# Patient Record
Sex: Male | Born: 1945
Health system: Southern US, Community
[De-identification: ages and names within clinical notes are randomized; demographics above are authoritative.]

## PROBLEM LIST (undated history)

## (undated) DIAGNOSIS — E785 Hyperlipidemia, unspecified: Secondary | ICD-10-CM

## (undated) DIAGNOSIS — C439 Malignant melanoma of skin, unspecified: Secondary | ICD-10-CM

## (undated) DIAGNOSIS — I82409 Acute embolism and thrombosis of unspecified deep veins of unspecified lower extremity: Secondary | ICD-10-CM

## (undated) DIAGNOSIS — I10 Essential (primary) hypertension: Secondary | ICD-10-CM

## (undated) DIAGNOSIS — N400 Enlarged prostate without lower urinary tract symptoms: Secondary | ICD-10-CM

## (undated) DIAGNOSIS — I2510 Atherosclerotic heart disease of native coronary artery without angina pectoris: Secondary | ICD-10-CM

## (undated) DIAGNOSIS — R339 Retention of urine, unspecified: Secondary | ICD-10-CM

## (undated) DIAGNOSIS — I639 Cerebral infarction, unspecified: Secondary | ICD-10-CM

## (undated) HISTORY — PX: TONSILLECTOMY: SUR1361

## (undated) HISTORY — PX: CATARACT EXTRACTION W/ INTRAOCULAR LENS  IMPLANT, BILATERAL: SHX1307

## (undated) HISTORY — DX: Cerebral infarction, unspecified: I63.9

## (undated) HISTORY — PX: COLONOSCOPY: SHX174

## (undated) HISTORY — DX: Hyperlipidemia, unspecified: E78.5

---

## 2001-07-29 DIAGNOSIS — I82409 Acute embolism and thrombosis of unspecified deep veins of unspecified lower extremity: Secondary | ICD-10-CM

## 2001-07-29 HISTORY — DX: Acute embolism and thrombosis of unspecified deep veins of unspecified lower extremity: I82.409

## 2002-01-25 ENCOUNTER — Ambulatory Visit (HOSPITAL_COMMUNITY): Admission: RE | Admit: 2002-01-25 | Discharge: 2002-01-25 | Payer: Self-pay | Admitting: Cardiology

## 2002-01-28 ENCOUNTER — Encounter: Payer: Self-pay | Admitting: Surgery

## 2002-02-01 ENCOUNTER — Encounter: Payer: Self-pay | Admitting: Surgery

## 2002-02-01 ENCOUNTER — Inpatient Hospital Stay (HOSPITAL_COMMUNITY): Admission: RE | Admit: 2002-02-01 | Discharge: 2002-02-05 | Payer: Self-pay | Admitting: Surgery

## 2002-02-02 ENCOUNTER — Encounter: Payer: Self-pay | Admitting: Surgery

## 2002-02-03 ENCOUNTER — Encounter: Payer: Self-pay | Admitting: Surgery

## 2002-02-05 HISTORY — PX: CORONARY ARTERY BYPASS GRAFT: SHX141

## 2002-02-16 ENCOUNTER — Encounter (HOSPITAL_COMMUNITY): Admission: RE | Admit: 2002-02-16 | Discharge: 2002-02-16 | Payer: Self-pay | Admitting: Cardiology

## 2002-02-16 ENCOUNTER — Encounter (HOSPITAL_COMMUNITY): Admission: RE | Admit: 2002-02-16 | Discharge: 2002-05-17 | Payer: Self-pay | Admitting: Cardiology

## 2002-02-17 ENCOUNTER — Inpatient Hospital Stay (HOSPITAL_COMMUNITY)
Admission: AD | Admit: 2002-02-17 | Discharge: 2002-02-24 | Payer: Self-pay | Admitting: Thoracic Surgery (Cardiothoracic Vascular Surgery)

## 2002-02-24 ENCOUNTER — Encounter: Payer: Self-pay | Admitting: Surgery

## 2002-03-01 ENCOUNTER — Encounter: Admission: RE | Admit: 2002-03-01 | Discharge: 2002-05-30 | Payer: Self-pay | Admitting: Surgery

## 2002-05-20 ENCOUNTER — Encounter (HOSPITAL_COMMUNITY): Admission: RE | Admit: 2002-05-20 | Discharge: 2002-08-18 | Payer: Self-pay | Admitting: Cardiology

## 2009-04-14 HISTORY — PX: US ECHOCARDIOGRAPHY: HXRAD669

## 2010-07-29 HISTORY — PX: MELANOMA EXCISION: SHX5266

## 2010-07-29 HISTORY — PX: OTHER SURGICAL HISTORY: SHX169

## 2011-07-14 ENCOUNTER — Emergency Department (HOSPITAL_COMMUNITY)
Admission: EM | Admit: 2011-07-14 | Discharge: 2011-07-14 | Disposition: A | Discharge status: Home / Self Care | Payer: Medicare Other | Attending: Emergency Medicine | Admitting: Emergency Medicine

## 2011-07-14 ENCOUNTER — Emergency Department (HOSPITAL_COMMUNITY): Payer: Medicare Other

## 2011-07-14 ENCOUNTER — Encounter: Payer: Self-pay | Admitting: Emergency Medicine

## 2011-07-14 DIAGNOSIS — Z79899 Other long term (current) drug therapy: Secondary | ICD-10-CM | POA: Insufficient documentation

## 2011-07-14 DIAGNOSIS — N5089 Other specified disorders of the male genital organs: Secondary | ICD-10-CM | POA: Insufficient documentation

## 2011-07-14 DIAGNOSIS — E785 Hyperlipidemia, unspecified: Secondary | ICD-10-CM | POA: Insufficient documentation

## 2011-07-14 DIAGNOSIS — Z7982 Long term (current) use of aspirin: Secondary | ICD-10-CM | POA: Insufficient documentation

## 2011-07-14 DIAGNOSIS — N453 Epididymo-orchitis: Secondary | ICD-10-CM | POA: Insufficient documentation

## 2011-07-14 DIAGNOSIS — R1032 Left lower quadrant pain: Secondary | ICD-10-CM | POA: Insufficient documentation

## 2011-07-14 DIAGNOSIS — N509 Disorder of male genital organs, unspecified: Secondary | ICD-10-CM | POA: Insufficient documentation

## 2011-07-14 DIAGNOSIS — I10 Essential (primary) hypertension: Secondary | ICD-10-CM | POA: Insufficient documentation

## 2011-07-14 DIAGNOSIS — N451 Epididymitis: Secondary | ICD-10-CM

## 2011-07-14 HISTORY — DX: Essential (primary) hypertension: I10

## 2011-07-14 HISTORY — DX: Retention of urine, unspecified: R33.9

## 2011-07-14 MED ORDER — HYDROCODONE-ACETAMINOPHEN 5-325 MG PO TABS: 1.0000 | ORAL_TABLET | ORAL | Status: AC | PRN

## 2011-07-14 MED ORDER — CIPROFLOXACIN HCL 500 MG PO TABS: 500.0000 mg | ORAL_TABLET | Freq: Two times a day (BID) | ORAL | Status: AC

## 2011-07-14 MED ORDER — CIPROFLOXACIN HCL 500 MG PO TABS
500.0000 mg | ORAL_TABLET | Freq: Once | ORAL | Status: AC
  Administered 2011-07-14: 500 mg via ORAL
  Filled 2011-07-14: qty 1

## 2011-07-14 NOTE — ED Provider Notes (Signed)
Medical screening examination/treatment/procedure(s) were performed by non-physician practitioner and as supervising physician I was immediately available for consultation/collaboration.   Celene Kras, MD 07/14/11 2009

## 2011-07-14 NOTE — ED Provider Notes (Signed)
History     CSN: [id number] Arrival date & time: 07/14/2011  3:28 PM   None     Chief Complaint  Patient presents with  . Groin Swelling    Pain in l/groin area x 48 hrs. hx of self cath due to chronic urine retention    (Consider location/radiation/quality/duration/timing/severity/associated sxs/prior treatment) Patient is a 65 y.o. male presenting with testicular pain. The history is provided by the patient.  Testicle Pain This is a new problem. The problem has been gradually worsening. Pertinent negatives include no chills or fever. Associated symptoms comments: The patient self catheterizes for urinary retention. 2 days ago he noticed a small amount of bleeding from his urethra without pain or soreness. He has been able to continue to catheterize without difficulty. No fever. Since Friday (2 days ago) he has had progressive swelling and soreness in the left testicle that cause shooting pain into his left lower abdomen. Pain is absent with rest and worse with ambulation. Since initially seeing blood 2 days ago, he has continued to cath regularly without any blood in the urine..    Past Medical History  Diagnosis Date  . Hypertension   . Urine retention   . Hyperlipemia     Past Surgical History  Procedure Date  . Cardiac surgery   . Tonsillectomy     Family History  Problem Relation Age of Onset  . Diabetes Father     History  Substance Use Topics  . Smoking status: Never Smoker   . Smokeless tobacco: Not on file  . Alcohol Use: No      Review of Systems  Constitutional: Negative for fever and chills.  Gastrointestinal: Negative.        See HPI.  Genitourinary: Positive for testicular pain. Negative for hematuria.  Skin: Negative.   Neurological: Negative.     Allergies  Review of patient's allergies indicates no known allergies.  Home Medications   Current Outpatient Rx  Name Route Sig Dispense Refill  . ASPIRIN 81 MG PO TABS Oral Take 81 mg by  mouth at bedtime.     . CO Q-10 100 MG PO CAPS Oral Take 1 capsule by mouth daily.      Marland Kitchen METHENAMINE MANDELATE 0.5 G PO TABS Oral Take 0.5 g by mouth 2 (two) times daily.      Marland Kitchen METOPROLOL SUCCINATE ER 50 MG PO TB24 Oral Take 50 mg by mouth daily.      . SAW PALMETTO PLUS PO Oral Take 1 capsule by mouth daily.      . OMEGA-3 & OMEGA-6 FISH OIL PO Oral Take 1 capsule by mouth daily.      Marland Kitchen RAMIPRIL 10 MG PO CAPS Oral Take 10 mg by mouth daily.      Marland Kitchen ROSUVASTATIN CALCIUM 20 MG PO TABS Oral Take 20 mg by mouth at bedtime.       BP 138/78  Pulse 67  Temp(Src) 99 F (37.2 C) (Oral)  Resp 18  SpO2 98%  Physical Exam  Constitutional: He appears well-developed and well-nourished.  Neck: Normal range of motion.  Pulmonary/Chest: Effort normal.  Abdominal: Soft. There is no tenderness.  Genitourinary: Penis normal. Right testis shows no mass, no swelling and no tenderness. Left testis shows swelling and tenderness. Left testis shows no mass. Uncircumcised. No discharge found.    ED Course  Procedures (including critical care time)  Labs Reviewed - No data to display No results found.   No diagnosis found.  MDM  US shows left epididymitis, uncomplicated. Patient started on Cipro, given pain medication for home use. Will discharge home to follow up with [facility] where he already an established patient.        Jacob Medin, PA 07/14/11 [phone]

## 2011-07-31 DIAGNOSIS — I2510 Atherosclerotic heart disease of native coronary artery without angina pectoris: Secondary | ICD-10-CM | POA: Diagnosis not present

## 2011-07-31 DIAGNOSIS — Z951 Presence of aortocoronary bypass graft: Secondary | ICD-10-CM | POA: Diagnosis not present

## 2011-08-02 DIAGNOSIS — I2510 Atherosclerotic heart disease of native coronary artery without angina pectoris: Secondary | ICD-10-CM | POA: Diagnosis not present

## 2011-08-02 DIAGNOSIS — E782 Mixed hyperlipidemia: Secondary | ICD-10-CM | POA: Diagnosis not present

## 2011-08-02 DIAGNOSIS — I10 Essential (primary) hypertension: Secondary | ICD-10-CM | POA: Diagnosis not present

## 2011-08-12 DIAGNOSIS — D239 Other benign neoplasm of skin, unspecified: Secondary | ICD-10-CM | POA: Diagnosis not present

## 2011-08-12 DIAGNOSIS — L570 Actinic keratosis: Secondary | ICD-10-CM | POA: Diagnosis not present

## 2011-08-12 DIAGNOSIS — Z85828 Personal history of other malignant neoplasm of skin: Secondary | ICD-10-CM | POA: Diagnosis not present

## 2011-08-12 DIAGNOSIS — L851 Acquired keratosis [keratoderma] palmaris et plantaris: Secondary | ICD-10-CM | POA: Diagnosis not present

## 2011-08-12 DIAGNOSIS — C4359 Malignant melanoma of other part of trunk: Secondary | ICD-10-CM | POA: Diagnosis not present

## 2011-08-12 DIAGNOSIS — D485 Neoplasm of uncertain behavior of skin: Secondary | ICD-10-CM | POA: Diagnosis not present

## 2011-08-14 DIAGNOSIS — E119 Type 2 diabetes mellitus without complications: Secondary | ICD-10-CM | POA: Diagnosis not present

## 2011-08-14 DIAGNOSIS — Z125 Encounter for screening for malignant neoplasm of prostate: Secondary | ICD-10-CM | POA: Diagnosis not present

## 2011-08-21 DIAGNOSIS — I2510 Atherosclerotic heart disease of native coronary artery without angina pectoris: Secondary | ICD-10-CM | POA: Diagnosis not present

## 2011-08-21 DIAGNOSIS — I10 Essential (primary) hypertension: Secondary | ICD-10-CM | POA: Diagnosis not present

## 2011-08-21 DIAGNOSIS — E119 Type 2 diabetes mellitus without complications: Secondary | ICD-10-CM | POA: Diagnosis not present

## 2011-08-21 DIAGNOSIS — E785 Hyperlipidemia, unspecified: Secondary | ICD-10-CM | POA: Diagnosis not present

## 2011-08-27 DIAGNOSIS — C4359 Malignant melanoma of other part of trunk: Secondary | ICD-10-CM | POA: Diagnosis not present

## 2011-08-28 DIAGNOSIS — N138 Other obstructive and reflux uropathy: Secondary | ICD-10-CM | POA: Diagnosis not present

## 2011-08-28 DIAGNOSIS — N401 Enlarged prostate with lower urinary tract symptoms: Secondary | ICD-10-CM | POA: Diagnosis not present

## 2011-08-28 DIAGNOSIS — R339 Retention of urine, unspecified: Secondary | ICD-10-CM | POA: Diagnosis not present

## 2011-09-06 ENCOUNTER — Other Ambulatory Visit: Payer: Self-pay | Admitting: Urology

## 2011-10-28 ENCOUNTER — Encounter (HOSPITAL_COMMUNITY): Payer: Self-pay | Admitting: Pharmacy Technician

## 2011-11-05 ENCOUNTER — Ambulatory Visit (HOSPITAL_COMMUNITY)
Admission: RE | Admit: 2011-11-05 | Discharge: 2011-11-05 | Disposition: A | Discharge status: Home / Self Care | Payer: Medicare Other | Source: Ambulatory Visit | Attending: Urology | Admitting: Urology

## 2011-11-05 ENCOUNTER — Encounter (HOSPITAL_COMMUNITY): Payer: Self-pay

## 2011-11-05 ENCOUNTER — Encounter (HOSPITAL_COMMUNITY)
Admission: RE | Admit: 2011-11-05 | Discharge: 2011-11-05 | Disposition: A | Discharge status: Home / Self Care | Payer: Medicare Other | Source: Ambulatory Visit | Attending: Urology | Admitting: Urology

## 2011-11-05 DIAGNOSIS — Z951 Presence of aortocoronary bypass graft: Secondary | ICD-10-CM | POA: Insufficient documentation

## 2011-11-05 DIAGNOSIS — I10 Essential (primary) hypertension: Secondary | ICD-10-CM | POA: Diagnosis not present

## 2011-11-05 DIAGNOSIS — Z01812 Encounter for preprocedural laboratory examination: Secondary | ICD-10-CM | POA: Insufficient documentation

## 2011-11-05 DIAGNOSIS — N400 Enlarged prostate without lower urinary tract symptoms: Secondary | ICD-10-CM | POA: Insufficient documentation

## 2011-11-05 DIAGNOSIS — R911 Solitary pulmonary nodule: Secondary | ICD-10-CM | POA: Diagnosis not present

## 2011-11-05 DIAGNOSIS — Z01818 Encounter for other preprocedural examination: Secondary | ICD-10-CM | POA: Diagnosis not present

## 2011-11-05 DIAGNOSIS — Z01811 Encounter for preprocedural respiratory examination: Secondary | ICD-10-CM | POA: Diagnosis not present

## 2011-11-05 DIAGNOSIS — I2510 Atherosclerotic heart disease of native coronary artery without angina pectoris: Secondary | ICD-10-CM | POA: Diagnosis not present

## 2011-11-05 HISTORY — DX: Benign prostatic hyperplasia without lower urinary tract symptoms: N40.0

## 2011-11-05 HISTORY — DX: Atherosclerotic heart disease of native coronary artery without angina pectoris: I25.10

## 2011-11-05 HISTORY — DX: Acute embolism and thrombosis of unspecified deep veins of unspecified lower extremity: I82.409

## 2011-11-05 LAB — SURGICAL PCR SCREEN
MRSA, PCR: NEGATIVE
Staphylococcus aureus: NEGATIVE

## 2011-11-05 LAB — CBC
HCT: 44.4 % (ref 39.0–52.0)
Hemoglobin: 15.2 g/dL (ref 13.0–17.0)
MCH: 29.3 pg (ref 26.0–34.0)
MCHC: 34.2 g/dL (ref 30.0–36.0)
MCV: 85.5 fL (ref 78.0–100.0)
Platelets: 127 10*3/uL — ABNORMAL LOW (ref 150–400)
RBC: 5.19 MIL/uL (ref 4.22–5.81)
RDW: 13.5 % (ref 11.5–15.5)
WBC: 7.4 10*3/uL (ref 4.0–10.5)

## 2011-11-05 LAB — BASIC METABOLIC PANEL
BUN: 17 mg/dL (ref 6–23)
CO2: 29 mEq/L (ref 19–32)
Calcium: 9.7 mg/dL (ref 8.4–10.5)
Chloride: 100 mEq/L (ref 96–112)
Creatinine, Ser: 0.9 mg/dL (ref 0.50–1.35)
GFR calc Af Amer: >90 mL/min (ref >90)
GFR calc non Af Amer: 87 mL/min — ABNORMAL LOW (ref >90)
Glucose, Bld: 100 mg/dL — ABNORMAL HIGH (ref 70–99)
Potassium: 4.5 mEq/L (ref 3.5–5.1)
Sodium: 136 mEq/L (ref 135–145)

## 2011-11-05 NOTE — Patient Instructions (Signed)
YOUR SURGERY IS SCHEDULED ON:  Friday 4/12  AT 7:15 AM  REPORT TO [facility] SHORT STAY [facility]ENTER AT:  5:15 AM      PHONE # FOR SHORT STAY IS [phone]  DO NOT EAT OR DRINK ANYTHING AFTER MIDNIGHT THE NIGHT BEFORE YOUR SURGERY.  YOU MAY BRUSH YOUR TEETH, RINSE OUT YOUR MOUTH--BUT NO WATER, NO FOOD, NO CHEWING GUM, NO MINTS, NO CANDIES, NO CHEWING TOBACCO.  PLEASE TAKE THE FOLLOWING MEDICATIONS THE AM OF YOUR SURGERY WITH A FEW SIPS OF WATER:  METOPROLOL    IF YOU USE INHALERS--USE YOUR INHALERS THE AM OF YOUR SURGERY AND BRING INHALERS TO THE HOSPITAL -TAKE TO SURGERY.    IF YOU ARE DIABETIC:  DO NOT TAKE ANY DIABETIC MEDICATIONS THE AM OF YOUR SURGERY.  IF YOU TAKE INSULIN IN THE EVENINGS--PLEASE ONLY TAKE 1/2 NORMAL EVENING DOSE THE NIGHT BEFORE YOUR SURGERY.  NO INSULIN THE AM OF YOUR SURGERY.  IF YOU HAVE SLEEP APNEA AND USE CPAP OR BIPAP--PLEASE BRING THE MASK --NOT THE MACHINE-NOT THE TUBING   -JUST THE MASK. DO NOT BRING VALUABLES, MONEY, CREDIT CARDS.  CONTACT LENS, DENTURES / PARTIALS, GLASSES SHOULD NOT BE WORN TO SURGERY AND IN MOST CASES-HEARING AIDS WILL NEED TO BE REMOVED.  BRING YOUR GLASSES CASE, ANY EQUIPMENT NEEDED FOR YOUR CONTACT LENS. FOR PATIENTS ADMITTED TO THE HOSPITAL--CHECK OUT TIME THE DAY OF DISCHARGE IS 11:00 AM.  ALL INPATIENT ROOMS ARE PRIVATE - WITH BATHROOM, TELEPHONE, TELEVISION AND WIFI INTERNET. IF YOU ARE BEING DISCHARGED THE SAME DAY OF YOUR SURGERY--YOU CAN NOT DRIVE YOURSELF HOME--AND SHOULD NOT GO HOME ALONE BY TAXI OR BUS.  NO DRIVING OR OPERATING MACHINERY FOR 24 HOURS FOLLOWING ANESTHESIA / PAIN MEDICATIONS.                            SPECIAL INSTRUCTIONS:  CHLORHEXIDINE SOAP SHOWER (other brand names are Betasept and Hibiclens ) PLEASE SHOWER WITH CHLORHEXIDINE THE NIGHT BEFORE YOUR SURGERY AND THE AM OF YOUR SURGERY. DO NOT USE CHLORHEXIDINE ON YOUR FACE OR PRIVATE AREAS--YOU MAY USE YOUR NORMAL SOAP THOSE AREAS AND YOUR NORMAL SHAMPOO.  WOMEN  SHOULD AVOID SHAVING UNDER ARMS AND SHAVING LEGS 48 HOURS BEFORE USING CHLORHEXIDINE TO AVOID SKIN IRRITATION.  DO NOT USE IF ALLERGIC TO CHLORHEXIDINE.  PLEASE READ OVER ANY  FACT SHEETS THAT YOU WERE GIVEN: MRSA INFORMATION

## 2011-11-05 NOTE — Pre-Procedure Instructions (Signed)
PT'S EKG REPORT 08/13/11, CARDIOLOGY OFFICE NOTES 08/02/11 ON PT'S CHART FROM [facility] / DR. Royann Shivers  -- ALONG WITH LAST NUCLEAR STRESS TEST REPORT 10/16/09 AND LAST ECHO REPORT 04/14/09. CBC, BMET, CXR WERE DONE TODAY - PREOP - AT [facility].

## 2011-11-07 NOTE — H&P (Signed)
66 yo who has been performing CIC since 2009 due to urinary retention. UDS has revealed on two occaions an obstructed voiding pattern with normal detrusor contraction. He presents today for TURP/TUVP in the hope he can stop CIC at some point and void on his own.   He has been well.   ROS: No dysuria, SP pain or uregency, urine has been clear. No CP or SOB.  PE: NAD A&Ox3 CV - RRR Abd - soft, NT Ext - no CCE   Past Medical History Problems  1. History of  Acute Cystitis 595.0 2. History of  Cardiac Catheterization  (Diagnostic) 3. History of  Coronary Artery Disease V12.59 4. History of  Heart Disease 429.9 5. History of  Hydroureteronephrosis On The Left 591 6. History of  Hypercholesterolemia 272.0 7. History of  Hypertension 401.9 8. History of  Hypogonadism 257.2 9. History of  Microscopic Hematuria 599.72  Surgical History Problems  1. History of  CABG (CABG) V45.81 2. History of  Tonsillectomy  Current Meds 1. Alfalfa TABS; Therapy: (Recorded:30Jan2013) to 2. Aspirin Low Dose 81 MG Oral Tablet; Therapy: (Recorded:03Nov2008) to 3. Crestor 20 MG Oral Tablet; Therapy: (Recorded:19Dec2012) to 4. Hydrocodone-Acetaminophen 5-500 MG Oral Tablet; Therapy: 29Jan2013 to 5. Methenamine Hippurate 1 GM Oral Tablet; 1/2 tab bid; Therapy: 17Jun2010 to  (Evaluate:28Dec2013)  Requested for: 22Jan2013; Last Rx:02Jan2013; Status: ACTIVE -  Renewal Denied 6. Mupirocin 2 % External Ointment; Therapy: 29Jan2013 to 7. Ramipril 10 MG Oral Capsule; Therapy: (Recorded:30Jan2013) to 8. Saw Palmetto Plus CAPS; Therapy: (Recorded:30Jan2013) to 9. Toprol XL 50 MG Oral Tablet Extended Release 24 Hour; Therapy: (Recorded:03Nov2008) to 10. Vitamin C TABS; Therapy: (Recorded:30Jan2013) to 11. Vitamin D3 CAPS; Therapy: (Recorded:30Jan2013) to  Allergies Medication  1. No Known Drug Allergies  Family History Problems  1. Maternal history of  Breast Cancer V16.3 2. Paternal history of  Death In  The Family Father 62- died during angioplasty sgy 3. Paternal history of  Death In The Family Mother 10- breast ca 4. Family history of  Family Health Status Number Of Children 2 sons 5. Paternal history of  Heart Disease V17.49 6. Paternal history of  Hypercholesterolemia  Social History Problems  1. Marital History - Currently Married 2. Occupation: Field seismologist Denied  3. History of  Alcohol Use 4. History of  Caffeine Use 5. History of  Tobacco Use V15.82  Assessment Assessed  1. Benign Prostatic Hypertrophy With Urinary Obstruction 600.01 2. Incomplete Emptying Of Bladder 788.21   Obstruction on urodynamics and patient performing CIC.   Plan Benign Prostatic Hypertrophy With Urinary Obstruction (600.01), Incomplete Emptying Of Bladder     Discussion/Summary I discussed again with the patient the nature, potential benefits, risks and alternatives to TURP/TUVP, including side effects of the proposed treatment, the likelihood of the patient achieving the goals of the procedure, and any potential problems that might occur during the procedure or recuperation. We talked again about RGE and impotence with TURP, etc. All questions answered. We discussed alternatives again such as continuing CIC. We discussed  TURP may effect choice of treatment for prostate cancer should that ever been an issue for him in the future. Patient elects to proceed with TURP/TUVP.

## 2011-11-08 ENCOUNTER — Encounter (HOSPITAL_COMMUNITY)
Admission: RE | Disposition: A | Discharge status: Home / Self Care | Payer: Self-pay | Source: Ambulatory Visit | Attending: Urology

## 2011-11-08 ENCOUNTER — Observation Stay (HOSPITAL_COMMUNITY)
Admission: RE | Admit: 2011-11-08 | Discharge: 2011-11-09 | Disposition: A | Discharge status: Home / Self Care | Payer: Medicare Other | Source: Ambulatory Visit | Attending: Urology | Admitting: Urology

## 2011-11-08 ENCOUNTER — Encounter (HOSPITAL_COMMUNITY): Payer: Self-pay | Admitting: Anesthesiology

## 2011-11-08 ENCOUNTER — Encounter (HOSPITAL_COMMUNITY): Payer: Self-pay | Admitting: *Deleted

## 2011-11-08 ENCOUNTER — Ambulatory Visit (HOSPITAL_COMMUNITY): Payer: Medicare Other | Admitting: Anesthesiology

## 2011-11-08 DIAGNOSIS — I10 Essential (primary) hypertension: Secondary | ICD-10-CM | POA: Diagnosis not present

## 2011-11-08 DIAGNOSIS — N3020 Other chronic cystitis without hematuria: Secondary | ICD-10-CM | POA: Diagnosis not present

## 2011-11-08 DIAGNOSIS — N139 Obstructive and reflux uropathy, unspecified: Secondary | ICD-10-CM | POA: Diagnosis not present

## 2011-11-08 DIAGNOSIS — Z951 Presence of aortocoronary bypass graft: Secondary | ICD-10-CM | POA: Insufficient documentation

## 2011-11-08 DIAGNOSIS — N401 Enlarged prostate with lower urinary tract symptoms: Secondary | ICD-10-CM | POA: Diagnosis not present

## 2011-11-08 DIAGNOSIS — Z79899 Other long term (current) drug therapy: Secondary | ICD-10-CM | POA: Insufficient documentation

## 2011-11-08 DIAGNOSIS — N410 Acute prostatitis: Secondary | ICD-10-CM | POA: Diagnosis not present

## 2011-11-08 DIAGNOSIS — N138 Other obstructive and reflux uropathy: Principal | ICD-10-CM | POA: Insufficient documentation

## 2011-11-08 DIAGNOSIS — R339 Retention of urine, unspecified: Secondary | ICD-10-CM | POA: Diagnosis not present

## 2011-11-08 DIAGNOSIS — N411 Chronic prostatitis: Secondary | ICD-10-CM | POA: Diagnosis not present

## 2011-11-08 DIAGNOSIS — E7800 Pure hypercholesterolemia, unspecified: Secondary | ICD-10-CM | POA: Insufficient documentation

## 2011-11-08 DIAGNOSIS — N3000 Acute cystitis without hematuria: Secondary | ICD-10-CM | POA: Diagnosis not present

## 2011-11-08 DIAGNOSIS — I2510 Atherosclerotic heart disease of native coronary artery without angina pectoris: Secondary | ICD-10-CM | POA: Insufficient documentation

## 2011-11-08 DIAGNOSIS — Z7982 Long term (current) use of aspirin: Secondary | ICD-10-CM | POA: Diagnosis not present

## 2011-11-08 DIAGNOSIS — D090 Carcinoma in situ of bladder: Secondary | ICD-10-CM | POA: Insufficient documentation

## 2011-11-08 DIAGNOSIS — N320 Bladder-neck obstruction: Secondary | ICD-10-CM | POA: Diagnosis not present

## 2011-11-08 HISTORY — PX: TRANSURETHRAL RESECTION OF PROSTATE: SHX73

## 2011-11-08 HISTORY — PX: CYSTOSCOPY WITH BIOPSY: SHX5122

## 2011-11-08 LAB — GLUCOSE, CAPILLARY: Glucose-Capillary: 100 mg/dL — ABNORMAL HIGH (ref 70–99)

## 2011-11-08 SURGERY — TRANSURETHRAL PROSTATECTOMY WITH GYRUS INSTRUMENTS
Anesthesia: General | Wound class: Clean Contaminated

## 2011-11-08 MED ORDER — ACETAMINOPHEN 10 MG/ML IV SOLN
INTRAVENOUS | Status: AC
  Filled 2011-11-08: qty 100

## 2011-11-08 MED ORDER — DOCUSATE SODIUM 100 MG PO CAPS
100.0000 mg | ORAL_CAPSULE | Freq: Two times a day (BID) | ORAL | Status: DC
  Administered 2011-11-08 – 2011-11-09 (×2): 100 mg via ORAL
  Filled 2011-11-08 (×3): qty 1

## 2011-11-08 MED ORDER — ONDANSETRON HCL 4 MG/2ML IJ SOLN
INTRAMUSCULAR | Status: DC | PRN
  Administered 2011-11-08: 4 mg via INTRAVENOUS

## 2011-11-08 MED ORDER — CIPROFLOXACIN IN D5W 400 MG/200ML IV SOLN
INTRAVENOUS | Status: AC
  Filled 2011-11-08: qty 200

## 2011-11-08 MED ORDER — ONDANSETRON HCL 4 MG/2ML IJ SOLN: 4.0000 mg | INTRAMUSCULAR | Status: DC | PRN

## 2011-11-08 MED ORDER — BELLADONNA ALKALOIDS-OPIUM 16.2-60 MG RE SUPP
RECTAL | Status: AC
  Filled 2011-11-08: qty 1

## 2011-11-08 MED ORDER — SODIUM CHLORIDE 0.9 % IR SOLN
Status: DC | PRN
  Administered 2011-11-08: 6000 mL

## 2011-11-08 MED ORDER — 0.9 % SODIUM CHLORIDE (POUR BTL) OPTIME
TOPICAL | Status: DC | PRN
  Administered 2011-11-08: 1000 mL

## 2011-11-08 MED ORDER — ZOLPIDEM TARTRATE 5 MG PO TABS: 5.0000 mg | ORAL_TABLET | Freq: Every evening | ORAL | Status: DC | PRN

## 2011-11-08 MED ORDER — RAMIPRIL 10 MG PO CAPS
10.0000 mg | ORAL_CAPSULE | Freq: Every day | ORAL | Status: DC
  Administered 2011-11-09: 10 mg via ORAL
  Filled 2011-11-08: qty 1

## 2011-11-08 MED ORDER — PROPOFOL 10 MG/ML IV EMUL
INTRAVENOUS | Status: DC | PRN
  Administered 2011-11-08: 200 mg via INTRAVENOUS

## 2011-11-08 MED ORDER — CIPROFLOXACIN HCL 250 MG PO TABS: 250.0000 mg | ORAL_TABLET | Freq: Two times a day (BID) | ORAL | Status: AC

## 2011-11-08 MED ORDER — LACTATED RINGERS IV SOLN
INTRAVENOUS | Status: DC
  Administered 2011-11-08 (×2): via INTRAVENOUS

## 2011-11-08 MED ORDER — CEFAZOLIN SODIUM-DEXTROSE 2-3 GM-% IV SOLR
2.0000 g | INTRAVENOUS | Status: AC
  Administered 2011-11-08: 2 g via INTRAVENOUS

## 2011-11-08 MED ORDER — OXYCODONE-ACETAMINOPHEN 5-325 MG PO TABS: 1.0000 | ORAL_TABLET | ORAL | Status: AC | PRN

## 2011-11-08 MED ORDER — METOPROLOL SUCCINATE ER 50 MG PO TB24
50.0000 mg | ORAL_TABLET | Freq: Every day | ORAL | Status: DC
  Administered 2011-11-09: 50 mg via ORAL
  Filled 2011-11-08: qty 1

## 2011-11-08 MED ORDER — CEFAZOLIN SODIUM 1-5 GM-% IV SOLN
1.0000 g | Freq: Three times a day (TID) | INTRAVENOUS | Status: AC
  Administered 2011-11-08 (×2): 1 g via INTRAVENOUS
  Filled 2011-11-08 (×2): qty 50

## 2011-11-08 MED ORDER — ACETAMINOPHEN 10 MG/ML IV SOLN
INTRAVENOUS | Status: DC | PRN
  Administered 2011-11-08: 1000 mg via INTRAVENOUS

## 2011-11-08 MED ORDER — HYDROMORPHONE HCL PF 1 MG/ML IJ SOLN: 0.2500 mg | INTRAMUSCULAR | Status: DC | PRN

## 2011-11-08 MED ORDER — LIDOCAINE HCL 2 % EX GEL
CUTANEOUS | Status: AC
  Filled 2011-11-08: qty 10

## 2011-11-08 MED ORDER — CEFAZOLIN SODIUM-DEXTROSE 2-3 GM-% IV SOLR
INTRAVENOUS | Status: AC
  Filled 2011-11-08: qty 50

## 2011-11-08 MED ORDER — HYOSCYAMINE SULFATE 0.125 MG SL SUBL
0.1250 mg | SUBLINGUAL_TABLET | SUBLINGUAL | Status: DC | PRN
  Filled 2011-11-08: qty 1

## 2011-11-08 MED ORDER — MORPHINE SULFATE 2 MG/ML IJ SOLN: 2.0000 mg | INTRAMUSCULAR | Status: DC | PRN

## 2011-11-08 MED ORDER — LIDOCAINE HCL 2 % EX GEL
CUTANEOUS | Status: DC | PRN
  Administered 2011-11-08: 1 via URETHRAL

## 2011-11-08 MED ORDER — ASPIRIN 81 MG PO TABS: 81.0000 mg | ORAL_TABLET | Freq: Every day | ORAL | Status: DC

## 2011-11-08 MED ORDER — LIDOCAINE HCL (CARDIAC) 20 MG/ML IV SOLN
INTRAVENOUS | Status: DC | PRN
  Administered 2011-11-08: 100 mg via INTRAVENOUS

## 2011-11-08 MED ORDER — DOCUSATE SODIUM 100 MG PO CAPS: 100.0000 mg | ORAL_CAPSULE | Freq: Two times a day (BID) | ORAL | Status: AC

## 2011-11-08 MED ORDER — SODIUM CHLORIDE 0.9 % IR SOLN
Status: DC | PRN
  Administered 2011-11-08: 18000 mL

## 2011-11-08 MED ORDER — BACITRACIN-NEOMYCIN-POLYMYXIN OINTMENT TUBE
1.0000 "application " | TOPICAL_OINTMENT | Freq: Three times a day (TID) | CUTANEOUS | Status: DC | PRN
  Administered 2011-11-08: 1 via TOPICAL
  Filled 2011-11-08: qty 14
  Filled 2011-11-08: qty 1

## 2011-11-08 MED ORDER — ATORVASTATIN CALCIUM 40 MG PO TABS
40.0000 mg | ORAL_TABLET | Freq: Every day | ORAL | Status: DC
  Filled 2011-11-08 (×2): qty 1

## 2011-11-08 MED ORDER — TAMSULOSIN HCL 0.4 MG PO CAPS: 0.4000 mg | ORAL_CAPSULE | Freq: Every day | ORAL | Status: DC

## 2011-11-08 MED ORDER — HYDROCODONE-ACETAMINOPHEN 5-325 MG PO TABS: 1.0000 | ORAL_TABLET | Freq: Four times a day (QID) | ORAL | Status: DC | PRN

## 2011-11-08 MED ORDER — FENTANYL CITRATE 0.05 MG/ML IJ SOLN
INTRAMUSCULAR | Status: DC | PRN
  Administered 2011-11-08 (×4): 50 ug via INTRAVENOUS

## 2011-11-08 MED ORDER — BELLADONNA ALKALOIDS-OPIUM 16.2-60 MG RE SUPP
RECTAL | Status: DC | PRN
  Administered 2011-11-08: 1 via RECTAL

## 2011-11-08 MED ORDER — LACTATED RINGERS IV SOLN
INTRAVENOUS | Status: DC | PRN
  Administered 2011-11-08 (×2): via INTRAVENOUS

## 2011-11-08 MED ORDER — ACETAMINOPHEN 325 MG PO TABS: 650.0000 mg | ORAL_TABLET | ORAL | Status: DC | PRN

## 2011-11-08 SURGICAL SUPPLY — 25 items
BAG URINE DRAINAGE (UROLOGICAL SUPPLIES) ×3 IMPLANT
BAG URO CATCHER STRL LF (DRAPE) ×3 IMPLANT
BLADE SURG 15 STRL LF DISP TIS (BLADE) IMPLANT
BLADE SURG 15 STRL SS (BLADE)
CATH FOLEY 3WAY 30CC 20FR (CATHETERS) ×3 IMPLANT
CATH FOLEY 3WAY 30CC 22FR (CATHETERS) ×3 IMPLANT
CLOTH BEACON ORANGE TIMEOUT ST (SAFETY) ×3 IMPLANT
CONT SPEC 4OZ CLIKSEAL STRL BL (MISCELLANEOUS) ×3 IMPLANT
DRAPE CAMERA CLOSED 9X96 (DRAPES) ×3 IMPLANT
ELECT BUTTON HF 24-28F 2 30DE (ELECTRODE) ×3 IMPLANT
ELECT HF RESECT BIPO 24F 45 ND (CUTTING LOOP) ×3 IMPLANT
ELECT LOOP MED HF 24F 12D (CUTTING LOOP) IMPLANT
EVACUATOR MICROVAS BLADDER (UROLOGICAL SUPPLIES) IMPLANT
GLOVE BIOGEL M STRL SZ7.5 (GLOVE) ×3 IMPLANT
GOWN STRL NON-REIN LRG LVL3 (GOWN DISPOSABLE) ×9 IMPLANT
GOWN STRL REIN XL XLG (GOWN DISPOSABLE) ×3 IMPLANT
HOLDER FOLEY CATH W/STRAP (MISCELLANEOUS) ×3 IMPLANT
IV NS IRRIG 3000ML ARTHROMATIC (IV SOLUTION) ×6 IMPLANT
KIT ASPIRATION TUBING (SET/KITS/TRAYS/PACK) IMPLANT
MANIFOLD NEPTUNE II (INSTRUMENTS) ×3 IMPLANT
PACK CYSTO (CUSTOM PROCEDURE TRAY) ×3 IMPLANT
SUT ETHILON 3 0 PS 1 (SUTURE) IMPLANT
SYR 30ML LL (SYRINGE) ×3 IMPLANT
SYRINGE IRR TOOMEY STRL 70CC (SYRINGE) ×3 IMPLANT
TUBING CONNECTING 10 (TUBING) ×3 IMPLANT

## 2011-11-08 NOTE — Anesthesia Preprocedure Evaluation (Signed)
Anesthesia Evaluation  Patient identified by MRN, date of birth, ID band Patient awake    Reviewed: Allergy & Precautions, H&P , NPO status , Patient's Chart, lab work & pertinent test results, reviewed documented beta blocker date and time   Airway Mallampati: II TM Distance: >3 FB Neck ROM: Full    Dental  (+) Teeth Intact and Dental Advisory Given   Pulmonary neg pulmonary ROS,  breath sounds clear to auscultation        Cardiovascular hypertension, Pt. on medications + CAD Rhythm:Regular Rate:Normal  CAD, s/p CABG 2003 Currently asymptomatic   Neuro/Psych negative neurological ROS  negative psych ROS   GI/Hepatic negative GI ROS, Neg liver ROS,   Endo/Other  Borderline DM, diet controll  Renal/GU negative Renal ROS   BPH    Musculoskeletal negative musculoskeletal ROS (+)   Abdominal   Peds negative pediatric ROS (+)  Hematology negative hematology ROS (+)   Anesthesia Other Findings   Reproductive/Obstetrics negative OB ROS                           Anesthesia Physical Anesthesia Plan  ASA: III  Anesthesia Plan: General   Post-op Pain Management:    Induction: Intravenous  Airway Management Planned: LMA  Additional Equipment:   Intra-op Plan:   Post-operative Plan: Extubation in OR  Informed Consent: I have reviewed the patients History and Physical, chart, labs and discussed the procedure including the risks, benefits and alternatives for the proposed anesthesia with the patient or authorized representative who has indicated his/her understanding and acceptance.   Dental advisory given  Plan Discussed with: CRNA and Surgeon  Anesthesia Plan Comments:         Anesthesia Quick Evaluation

## 2011-11-08 NOTE — Preoperative (Signed)
Beta Blockers   Reason not to administer Beta Blockers:Not Applicable 

## 2011-11-08 NOTE — Anesthesia Postprocedure Evaluation (Signed)
  Anesthesia Post-op Note  Patient: Jacob Hensley  Procedure(s) Performed: Procedure(s) (LRB): TRANSURETHRAL PROSTATECTOMY WITH GYRUS INSTRUMENTS (N/A) CYSTOSCOPY WITH BIOPSY ()  Patient Location: PACU  Anesthesia Type: General  Level of Consciousness: oriented and sedated  Airway and Oxygen Therapy: Patient Spontanous Breathing and Patient connected to nasal cannula oxygen  Post-op Pain: mild  Post-op Assessment: Post-op Vital signs reviewed, Patient's Cardiovascular Status Stable, Respiratory Function Stable and Patent Airway  Post-op Vital Signs: stable  Complications: No apparent anesthesia complications

## 2011-11-08 NOTE — Discharge Instructions (Signed)
Transurethral Resection of the Prostate Care After Refer to this sheet in the next few weeks. These discharge instructions provide you with general information on caring for yourself after you leave the hospital. Your caregiver may also give you specific instructions. Your treatment has been planned according to the most current medical practices available, but unavoidable complications sometimes occur. If you have any problems or questions after discharge, please call your caregiver. HOME CARE INSTRUCTIONS  Medications  You will receive medicine for pain management. As your level of discomfort decreases, adjustments in your pain medicines may be made.   Since it is important that you do deep breathing and coughing exercises and increase your activity, it may be helpful to take pain medicines prior to these activities. Take all medicines as directed.   You may be given a medicine (antibiotic) to kill germs following surgery. Finish all medicines. Let your caregiver know if you have any side effects or problems from the medicine.  Hygiene  You can take a shower after surgery.   You should not take a bath while you still have the urethral catheter.  Activity  You will be encouraged to get out of bed as much as possible and increase your activity level as tolerated.   Spend the first week in and around your home. For 10 days, avoid the following:   Straining.   Running.   Strenuous work.   Walks longer than a few blocks.   Riding for extended periods.   Sexual relations.   Do not lift heavy objects (more than 5 pounds/2.25 kilograms) for at least 1 month. When lifting, use your arms instead of your abdominal muscles.   You will be encouraged to walk as tolerated. Do not exert yourself. Increase your activity level slowly. Remember that it is important to keep moving after an operation of any type. This cuts down on the possibility of developing blood clots.   Your caregiver will  tell you when you can resume driving and light housework. Discuss this at your first office visit after discharge.  Diet  No special diet is ordered after a TURP. However, if you are on a special diet for another medical problem, it should be continued.   Normal fluid intake is usually recommended.   Avoid alcohol and caffeinated drinks for 2 weeks. They irritate the bladder. Decaffeinated drinks are okay.   Avoid spicy foods.  Bladder Function  For the first 10 days, empty the bladder whenever you feel a definite desire. Do not try to hold the urine for long periods of time.   Urinating once or twice a night even after you are healed is not uncommon.   You may see some recurrence of blood in the urine after discharge from the hospital. If this occurs, force fluids again as you did in the hospital and reduce your activity.  Bowel Function  You may experience some constipation after surgery. This can be minimized by increasing fluids and fiber in your diet. Drink enough water and fluids to keep your urine clear or pale yellow.   A stool softener may be prescribed for use at home. Do not strain to move your bowels.   If you are requiring increased pain medicine, it is important that you take stool softeners to prevent constipation. This will help to promote proper healing by reducing the need to strain to move your bowels.  Sexual Activity  Semen movement in the opposite direction and into the bladder (retrograde ejaculation) may   occur. Since the semen passes into the bladder, cloudy urine can occur the first time you urinate after intercourse. Or, you may not have an ejaculation during erection. Ask your caregiver when you can resume sexual activity. Retrograde ejaculation and reduced semen discharge should not reduce one's pleasure of intercourse.  Postoperative Visit  Arrange the date and time of your after surgery visit with your caregiver.  Return to Work  After your recovery is  complete, you will be able to return to work and resume all activities. Your caregiver will inform you when you can return to work.  Foley Catheter Care A soft, flexible tube (Foley catheter) has been placed in your bladder to drain urine and fluid. Follow these instructions: Taking Care of the Catheter  Keep the area where the catheter leaves your body clean.   Attach the catheter to the leg so there is no tension on the catheter.   Keep the drainage bag below the level of the bladder, but keep it OFF the floor.   Do not take long soaking baths. Your caregiver will give instructions about showering.   Wash your hands before touching ANYTHING related to the catheter or bag.   Using mild soap and warm water on a washcloth:   Clean the area closest to the catheter insertion site using a circular motion around the catheter.   Clean the catheter itself by wiping AWAY from the insertion site for several inches down the tube.   NEVER wipe upward as this could sweep bacteria up into the urethra (tube in your body that normally drains the bladder) and cause infection.   Place a small amount of sterile lubricant at the tip of the penis where the catheter is entering.  Taking Care of the Drainage Bags  Two drainage bags will be taken home: a large overnight drainage bag, and a smaller leg bag which fits underneath clothing.   It is okay to wear the overnight bag at any time, but NEVER wear the smaller leg bag at night.   Keep the drainage bag well below the level of your bladder. This prevents backflow of urine into the bladder and allows the urine to drain freely.   Anchor the tubing to your leg to prevent pulling or tension on the catheter. Use tape or a leg strap provided by the hospital.   Empty the drainage bag when it is 1/2 to 3/4 full. Wash your hands before and after touching the bag.   Periodically check the tubing for kinks to make sure there is no pressure on the tubing which  could restrict the flow of urine.  Changing the Drainage Bags  Cleanse both ends of the clean bag with alcohol before changing.   Pinch off the rubber catheter to avoid urine spillage during the disconnection.   Disconnect the dirty bag and connect the clean one.   Empty the dirty bag carefully to avoid a urine spill.   Attach the new bag to the leg with tape or a leg strap.  Cleaning the Drainage Bags  Whenever a drainage bag is disconnected, it must be cleaned quickly so it is ready for the next use.   Wash the bag in warm, soapy water.   Rinse the bag thoroughly with warm water.   Soak the bag for 30 minutes in a solution of white vinegar and water (1 cup vinegar to 1 quart warm water).   Rinse with warm water.  SEEK MEDICAL CARE IF:     You have chills or night sweats.   You are leaking around your catheter or have problems with your catheter. It is not uncommon to have sporadic leakage around your catheter as a result of bladder spasms. If the leakage stops, there is not much need for concern. If you are uncertain, call your caregiver.   You develop side effects that you think are coming from your medicines.  SEEK IMMEDIATE MEDICAL CARE IF:   You are suddenly unable to urinate. Check to see if there are any kinks in the drainage tubing that may cause this. If you cannot find any kinks, call your caregiver immediately. This is an emergency.   You develop shortness of breath or chest pains.   Bleeding persists or clots develop in your urine.   You have a fever.   You develop pain in your back or over your lower belly (abdomen).   You develop pain or swelling in your legs.   Any problems you are having get worse rather than better.  MAKE SURE YOU:   Understand these instructions.   Will watch your condition.   Will get help right away if you are not doing well or get worse.  Document Released: 07/15/2005 Document Revised: 07/04/2011 Document Reviewed:  03/08/2009 [employer] Patient Information 2012 [employer]. 

## 2011-11-08 NOTE — Progress Notes (Signed)
Pt ambulated 2 laps around nurses station. Tolerated very well. Julio Sicks RN

## 2011-11-08 NOTE — Transfer of Care (Signed)
Immediate Anesthesia Transfer of Care Note  Patient: Jacob Hensley  Procedure(s) Performed: Procedure(s) (LRB): TRANSURETHRAL PROSTATECTOMY WITH GYRUS INSTRUMENTS (N/A) CYSTOSCOPY WITH BIOPSY ()  Patient Location: PACU  Anesthesia Type: General  Level of Consciousness: awake, alert  and oriented  Airway & Oxygen Therapy: Patient Spontanous Breathing and Patient connected to face mask oxygen  Post-op Assessment: Report given to PACU RN and Post -op Vital signs reviewed and stable  Post vital signs: Reviewed and stable  Complications: No apparent anesthesia complications

## 2011-11-08 NOTE — Brief Op Note (Signed)
11/08/2011  9:32 AM  PATIENT:  Jacob Hensley  66 y.o. male  PRE-OPERATIVE DIAGNOSIS:  Benign localized hyperplasia of prostate with urinary obstruction and other lower urinary tract symptoms ; bladder outlet obstruction  POST-OPERATIVE DIAGNOSIS:  Benign localized hyperplasia of prostate with urinary obstruction and other lower urinary tract symptoms ; bladder outlet obstruction; bladder neoplasm  PROCEDURE:  Procedure(s) (LRB): TRANSURETHRAL PROSTATECTOMY WITH GYRUS INSTRUMENTS (N/A) CYSTOSCOPY WITH BIOPSY and fulguration  SURGEON:  Surgeon(s) and Role:    * Antony Haste, MD - Primary  ANESTHESIA:  General  FINDINGS - trilobar BPH with large median lobe, erythema of dome and posterior right (with almost an early papillary appearance)  EBL: 20 ml  BLOOD ADMINISTERED:none  DRAINS: 20 Fr 3 way foley  SPECIMEN:  Biopsy / Limited Resection - 1) urine cytology, 2) urine cx, 3) bladder biopsy rt posterior 4) bladder bx dome 5) prostate chips  DISPOSITION OF SPECIMEN:  Path  COUNTS:  YES  DICTATION: [id number]  PLAN OF CARE: Admit for overnight observation  PATIENT DISPOSITION:  PACU - hemodynamically stable.   Delay start of Pharmacological VTE agent (>24hrs) due to surgical blood loss or risk of bleeding: not applicable

## 2011-11-09 MED ORDER — ROSUVASTATIN CALCIUM 20 MG PO TABS
20.0000 mg | ORAL_TABLET | Freq: Every day | ORAL | Status: DC
  Filled 2011-11-09: qty 1

## 2011-11-09 NOTE — Progress Notes (Signed)
D/c home accomp. By wife. All d/c instructions with meds discussed with pt and wife.  Leg bag teaching and closed drainage bag system discussed with pt,. Understanding of use of leg bags verb.,.  Pt very eager to learn. TURP general teaching sheet reviewed by pt.,. D/c calm, pleasant, with no concerns voiced.  Per pt., he is to f/u with Dr. Mena Goes this Wed,. Stated he will call on Monday to confirm appt.,.

## 2011-11-09 NOTE — Progress Notes (Signed)
Patient ID: Jacob Hensley, male   DOB: 1945/11/09, 66 y.o.   MRN: [id number] 1 Day Post-Op Subjective: Patient reports having a good night without any major issues or concerns. Urine is light pink in color today. Plan was to discharge him this morning if doing well.  Objective: Vital signs in last 24 hours: Temp:  [97.2 F (36.2 C)-98.3 F (36.8 C)] 98.2 F (36.8 C) (04/13 1478) Pulse Rate:  [40-52] 45  (04/13 0614) Resp:  [9-17] 16  (04/13 0614) BP: (111-153)/(68-83) 119/68 mmHg (04/13 0614) SpO2:  [94 %-100 %] 94 % (04/13 0614) Weight:  [83.1 kg (183 lb 3.2 oz)] 83.1 kg (183 lb 3.2 oz) (04/12 1315)  Intake/Output from previous day: 04/12 0701 - 04/13 0700 In: 3782.5 [P.O.:1200; I.V.:2482.5; IV Piggyback:100] Out: 2850 [Urine:2850] Intake/Output this shift:    Physical Exam:  Constitutional: Vital signs reviewed. WD WN in NAD   Pulmonary/Chest: Normal effort Abdominal: Soft. Non-tender, non-distended, bowel sounds are normal, no masses, organomegaly, or guarding present.  Genitourinary:foley indwelling Extremities: No cyanosis or edema   Lab Results: No results found for this basename: HGB:3,HCT:3 in the last 72 hours BMET No results found for this basename: NA:2,K:2,CL:2,CO2:2,GLUCOSE:2,BUN:2,CREATININE:2,CALCIUM:2 in the last 72 hours No results found for this basename: LABPT:3,INR:3 in the last 72 hours No results found for this basename: LABURIN:1 in the last 72 hours Results for orders placed during the hospital encounter of 11/05/11  SURGICAL PCR SCREEN     Status: Normal   Collection Time   11/05/11 11:45 AM      Component Value Range Status Comment   MRSA, PCR NEGATIVE  NEGATIVE  Final    Staphylococcus aureus NEGATIVE  NEGATIVE  Final     Studies/Results: No results found.  Assessment/Plan:   Doing well postoperative day 1. Patient discharged home with the follow up plans I. and prescriptions as outlined by Dr. Mena Goes. Leg bag teaching this morning  discussed with nursing services.   LOS: 1 day   Eriverto Byrnes S 11/09/2011, 7:43 AM

## 2011-11-09 NOTE — Progress Notes (Signed)
Urine clear, very, very light pink.

## 2011-11-09 NOTE — Op Note (Signed)
NAMETREVION, Jacob Hensley NO.:  [id number]  MEDICAL RECORD NO.:  [id number]  LOCATION:  1445                         FACILITY:  [facility]  PHYSICIAN:  Jerilee Field, MD   DATE OF BIRTH:  1945-11-30  DATE OF PROCEDURE: DATE OF DISCHARGE:                              OPERATIVE REPORT   PREOPERATIVE DIAGNOSES: 1. Benign prostatic hypertrophy. 2. Bladder outlet obstruction and chronic urinary retention.  POSTOPERATIVE DIAGNOSES: 1. Benign prostatic hypertrophy. 2. Bladder outlet obstruction and chronic urinary retention. 3. Neoplasm of bladder.  PROCEDURE: 1. Cystoscopy with bladder biopsy and fulguration. 2. Transurethral resection of prostate with bipolar driver.  SURGEON:  Jerilee Field, MD.  ANESTHESIA:  General.  INDICATION FOR PROCEDURE:  Jacob Hensley is a 66 year old male who has been doing clean intermittent catheterization for several years due to urinary retention.  He does get an urge to urinate, and a sense to urinate but cannot.  Urodynamics revealed normal sensation with a normal capacity bladder, but bladder outlet obstruction with high pressure bladder contraction and no urine flow.  I discussed the findings with the patient.  We discussed the nature, risks, benefits and alternatives to transurethral resection of the prostate in the hope that he can stop having to perform CIC.  We talked about alternatives such as nontreatment and continuing CIC.  All questions were answered and he elected to proceed with TURP.  FINDINGS:  On cystoscopy, there was trilobar hypertrophy with a large median lobe and 100%visually obstructing lateral lobes.  On the right posterior of the bladder, there was a small erythematous patch that almost had an early papillary like appearance and on the dome there was some erythema.  Urine was sent for culture and cytology.  The posterior right and the dome were biopsied with cold cup forceps.  At the end of the  procedure, and at the end of resection, the bladder was normal with no chips.  The trigone and ureteral orifices were intact and normal. There was a good patent prostatic urethra looking up from the ureter.  PROCEDURE:  After consent was obtained, the patient was brought to the operating room.  A time-out was performed to confirm the patient and procedure.  After adequate anesthesia, he was placed in lithotomy position.  Exam under anesthesia revealed a normal penis and testicles without masses.  The prostate was approximately 60 g on palpation; with digital rectal exam there were no hard areas or nodules.  The prostate was smooth.  He was then prepped and draped in the usual fashion.  The cystoscope was passed per urethra.  The bladder was examined in its entirety with previous findings.  Using the cold cup biopsy forceps, the right posterior and dome were biopsied after sending urine for culture and cytology.  The cystoscope was removed and the resectoscope was then placed using the loop.  The biopsy sites were fulgurated with excellent hemostasis.  The ureteral orifices were identified and marked.  Then the median lobe was resected down to the veru starting on the right side at 5 o'clock and the left side at 7 o'clock in the median lobe and the middle was resected down to the bladder neck and prostatic capsule. Adequate  hemostasis was obtained.  All the chips were removed.  The right lobe was then resected from 2 o'clock down to a connection with the median lobe resection and in a similar way the left lobe was then resected from 2 o'clock down to connection with the median lobe.  All the chips were evacuated.  Adequate hemostasis was ensured.  There were some anterior tissue hanging down now and this was resected with the loop.  Adequate hemostasis was ensured.  The bladder and chips were drained.  Inspection of the fossa revealed some residual lateral lobe tissue, which was resected.   Again adequate hemostasis was ensured.  The UOs were inspected and noted to be normal.  All the chips were evacuated.  This now left a very good channel from the veru looking into the bladder.  The resection was not carried distal to the veru.  The bladder was inspected a final time.  The biopsy sites were inspected and noted to be of adequate hemostasis.  The bladder was filled with saline and the scope was removed.  He had a good flow of urine with pressure on the bladder.  A 20-French 3 way Foley was placed with a catheter guide without difficulty.  The balloon was inflated and seated at the bladder neck with 30 mL.  The irrigation was very light pink.  It was connected to CBI for wake-up and transport.  The patient was then taken to the recovery room in stable condition.  COMPLICATIONS:  None.  DRAINS:  #20 [place] 3 way Foley.  SPECIMENS: 1. Urine cytology. 2. Urine culture. 3. Bladder biopsy, right posterior. 4. Bladder biopsy, dome. 5. Prostate chips.  DISPOSITION:  The patient stable to PACU.          ______________________________ Jerilee Field, MD     ME/MEDQ  D:  11/08/2011  T:  11/09/2011  Job:  [id number]

## 2011-11-11 LAB — URINE CULTURE
Colony Count: 75000
Culture  Setup Time: 201304121244

## 2011-11-12 NOTE — Discharge Summary (Signed)
Physician Discharge Summary  Patient ID: Jacob Hensley MRN: [id number] DOB/AGE: 66-Jun-1947 66 y.o.  Admit date: 11/08/2011 Discharge date: 11/12/2011  Admission Diagnoses: BPH, BOO  Discharge Diagnoses: BPH, BOO  Discharged Condition: good  Hospital Course: Admitted after TURP. Stable and discharged to home with foley POD#1.  Consults: None  Significant Diagnostic Studies: none  Treatments: surgery: TURP, cysto/bladder biopsy  Discharge Exam: Blood pressure 119/68, pulse 45, temperature 98.2 F (36.8 C), temperature source Oral, resp. rate 16, height 5\' 9"  (1.753 m), weight 83.1 kg (183 lb 3.2 oz), SpO2 94.00%. NAD Foley - urine clear , no clots  Disposition: 01-Home or Self Care   Medication List  As of 11/12/2011  7:33 AM   TAKE these medications         aspirin 81 MG tablet   Take 1 tablet (81 mg total) by mouth at bedtime.      ciprofloxacin 250 MG tablet   Commonly known as: CIPRO   Take 1 tablet (250 mg total) by mouth 2 (two) times daily.      Co Q-10 100 MG Caps   Take 1 capsule by mouth daily.      docusate sodium 100 MG capsule   Commonly known as: COLACE   Take 1 capsule (100 mg total) by mouth 2 (two) times daily.      methenamine 0.5 GM tablet   Commonly known as: MANDELAMINE   Take 0.5 g by mouth 2 (two) times daily.      metoprolol succinate 50 MG 24 hr tablet   Commonly known as: TOPROL-XL   Take 50 mg by mouth daily before breakfast.      mulitivitamin with minerals Tabs   Take 1 tablet by mouth daily.      OMEGA-3 & OMEGA-6 FISH OIL PO   Take 1 capsule by mouth daily.      OVER THE COUNTER MEDICATION   VITALIZER GOLD  - SHAKELEE FOOD SUPPLEMENT      OVER THE COUNTER MEDICATION   VITA-D3  -  SHAKLEE FOOD SUPPLEMENT      OVER THE COUNTER MEDICATION   ALFALFA COMPLEX - SHAKLEE FOOD SUPPLEMENT      OVER THE COUNTER MEDICATION   VITA C  5OOMG  - SHAKLEE FOOD SUPPLEMENT      oxyCODONE-acetaminophen 5-325 MG per tablet   Commonly  known as: PERCOCET   Take 1 tablet by mouth every 4 (four) hours as needed for pain.      ramipril 10 MG capsule   Commonly known as: ALTACE   Take 10 mg by mouth daily after breakfast.      rosuvastatin 20 MG tablet   Commonly known as: CRESTOR   Take 20 mg by mouth at bedtime.      SAW PALMETTO PLUS PO   Take 1 capsule by mouth daily.      Tamsulosin HCl 0.4 MG Caps   Commonly known as: FLOMAX   Take 1 capsule (0.4 mg total) by mouth daily after supper.           Follow-up Information    Follow up with BRUNING,ASHLYN DENISE, PA in 1 week.   Contact information:   [address] [facility] [address] [facility] [place] [address] [phone]          Signed: Antony Haste 11/12/2011, 7:33 AM

## 2011-11-20 ENCOUNTER — Encounter (HOSPITAL_COMMUNITY): Payer: Self-pay | Admitting: Urology

## 2011-11-22 DIAGNOSIS — N138 Other obstructive and reflux uropathy: Secondary | ICD-10-CM | POA: Diagnosis not present

## 2011-11-22 DIAGNOSIS — N401 Enlarged prostate with lower urinary tract symptoms: Secondary | ICD-10-CM | POA: Diagnosis not present

## 2011-12-17 ENCOUNTER — Other Ambulatory Visit: Payer: Self-pay | Admitting: Dermatology

## 2011-12-17 DIAGNOSIS — Z85820 Personal history of malignant melanoma of skin: Secondary | ICD-10-CM | POA: Diagnosis not present

## 2011-12-17 DIAGNOSIS — C44519 Basal cell carcinoma of skin of other part of trunk: Secondary | ICD-10-CM | POA: Diagnosis not present

## 2011-12-17 DIAGNOSIS — D0460 Carcinoma in situ of skin of unspecified upper limb, including shoulder: Secondary | ICD-10-CM | POA: Diagnosis not present

## 2011-12-17 DIAGNOSIS — L821 Other seborrheic keratosis: Secondary | ICD-10-CM | POA: Diagnosis not present

## 2011-12-17 DIAGNOSIS — L570 Actinic keratosis: Secondary | ICD-10-CM | POA: Diagnosis not present

## 2011-12-17 DIAGNOSIS — Z85828 Personal history of other malignant neoplasm of skin: Secondary | ICD-10-CM | POA: Diagnosis not present

## 2011-12-17 DIAGNOSIS — C44621 Squamous cell carcinoma of skin of unspecified upper limb, including shoulder: Secondary | ICD-10-CM | POA: Diagnosis not present

## 2011-12-26 DIAGNOSIS — N401 Enlarged prostate with lower urinary tract symptoms: Secondary | ICD-10-CM | POA: Diagnosis not present

## 2012-01-22 DIAGNOSIS — D090 Carcinoma in situ of bladder: Secondary | ICD-10-CM | POA: Diagnosis not present

## 2012-01-29 DIAGNOSIS — D090 Carcinoma in situ of bladder: Secondary | ICD-10-CM | POA: Diagnosis not present

## 2012-02-11 DIAGNOSIS — E785 Hyperlipidemia, unspecified: Secondary | ICD-10-CM | POA: Diagnosis not present

## 2012-02-11 DIAGNOSIS — I10 Essential (primary) hypertension: Secondary | ICD-10-CM | POA: Diagnosis not present

## 2012-02-11 DIAGNOSIS — E119 Type 2 diabetes mellitus without complications: Secondary | ICD-10-CM | POA: Diagnosis not present

## 2012-02-12 DIAGNOSIS — N3020 Other chronic cystitis without hematuria: Secondary | ICD-10-CM | POA: Diagnosis not present

## 2012-02-18 DIAGNOSIS — E119 Type 2 diabetes mellitus without complications: Secondary | ICD-10-CM | POA: Diagnosis not present

## 2012-02-18 DIAGNOSIS — E785 Hyperlipidemia, unspecified: Secondary | ICD-10-CM | POA: Diagnosis not present

## 2012-02-18 DIAGNOSIS — I2510 Atherosclerotic heart disease of native coronary artery without angina pectoris: Secondary | ICD-10-CM | POA: Diagnosis not present

## 2012-02-18 DIAGNOSIS — I10 Essential (primary) hypertension: Secondary | ICD-10-CM | POA: Diagnosis not present

## 2012-02-19 DIAGNOSIS — D090 Carcinoma in situ of bladder: Secondary | ICD-10-CM | POA: Diagnosis not present

## 2012-02-26 DIAGNOSIS — C679 Malignant neoplasm of bladder, unspecified: Secondary | ICD-10-CM | POA: Diagnosis not present

## 2012-03-04 DIAGNOSIS — D090 Carcinoma in situ of bladder: Secondary | ICD-10-CM | POA: Diagnosis not present

## 2012-03-11 DIAGNOSIS — C679 Malignant neoplasm of bladder, unspecified: Secondary | ICD-10-CM | POA: Diagnosis not present

## 2012-03-18 DIAGNOSIS — D090 Carcinoma in situ of bladder: Secondary | ICD-10-CM | POA: Diagnosis not present

## 2012-06-03 DIAGNOSIS — N401 Enlarged prostate with lower urinary tract symptoms: Secondary | ICD-10-CM | POA: Diagnosis not present

## 2012-06-03 DIAGNOSIS — R82998 Other abnormal findings in urine: Secondary | ICD-10-CM | POA: Diagnosis not present

## 2012-06-03 DIAGNOSIS — D090 Carcinoma in situ of bladder: Secondary | ICD-10-CM | POA: Diagnosis not present

## 2012-06-04 ENCOUNTER — Other Ambulatory Visit: Payer: Self-pay | Admitting: Urology

## 2012-06-10 ENCOUNTER — Encounter (HOSPITAL_COMMUNITY): Payer: Self-pay | Admitting: Pharmacy Technician

## 2012-06-11 ENCOUNTER — Encounter (HOSPITAL_COMMUNITY)
Admission: RE | Admit: 2012-06-11 | Discharge: 2012-06-11 | Disposition: A | Discharge status: Home / Self Care | Payer: Medicare Other | Source: Ambulatory Visit | Attending: Urology | Admitting: Urology

## 2012-06-11 ENCOUNTER — Encounter (HOSPITAL_COMMUNITY): Payer: Self-pay

## 2012-06-11 DIAGNOSIS — Z7982 Long term (current) use of aspirin: Secondary | ICD-10-CM | POA: Diagnosis not present

## 2012-06-11 DIAGNOSIS — E7800 Pure hypercholesterolemia, unspecified: Secondary | ICD-10-CM | POA: Diagnosis not present

## 2012-06-11 DIAGNOSIS — N401 Enlarged prostate with lower urinary tract symptoms: Secondary | ICD-10-CM | POA: Diagnosis not present

## 2012-06-11 DIAGNOSIS — N3090 Cystitis, unspecified without hematuria: Secondary | ICD-10-CM | POA: Diagnosis not present

## 2012-06-11 DIAGNOSIS — Z951 Presence of aortocoronary bypass graft: Secondary | ICD-10-CM | POA: Diagnosis not present

## 2012-06-11 DIAGNOSIS — I10 Essential (primary) hypertension: Secondary | ICD-10-CM | POA: Diagnosis not present

## 2012-06-11 DIAGNOSIS — D090 Carcinoma in situ of bladder: Secondary | ICD-10-CM | POA: Diagnosis not present

## 2012-06-11 DIAGNOSIS — I2510 Atherosclerotic heart disease of native coronary artery without angina pectoris: Secondary | ICD-10-CM | POA: Diagnosis not present

## 2012-06-11 LAB — BASIC METABOLIC PANEL
BUN: 21 mg/dL (ref 6–23)
CO2: 25 mEq/L (ref 19–32)
Calcium: 9.6 mg/dL (ref 8.4–10.5)
Chloride: 103 mEq/L (ref 96–112)
Creatinine, Ser: 0.91 mg/dL (ref 0.50–1.35)
GFR calc Af Amer: >90 mL/min (ref >90)
GFR calc non Af Amer: 86 mL/min — ABNORMAL LOW (ref >90)
Glucose, Bld: 110 mg/dL — ABNORMAL HIGH (ref 70–99)
Potassium: 3.9 mEq/L (ref 3.5–5.1)
Sodium: 139 mEq/L (ref 135–145)

## 2012-06-11 LAB — CBC
HCT: 43 % (ref 39.0–52.0)
Hemoglobin: 14.9 g/dL (ref 13.0–17.0)
MCH: 29 pg (ref 26.0–34.0)
MCHC: 34.7 g/dL (ref 30.0–36.0)
MCV: 83.7 fL (ref 78.0–100.0)
Platelets: 139 10*3/uL — ABNORMAL LOW (ref 150–400)
RBC: 5.14 MIL/uL (ref 4.22–5.81)
RDW: 13 % (ref 11.5–15.5)
WBC: 5.8 10*3/uL (ref 4.0–10.5)

## 2012-06-11 LAB — SURGICAL PCR SCREEN
MRSA, PCR: NEGATIVE
Staphylococcus aureus: NEGATIVE

## 2012-06-11 NOTE — Patient Instructions (Signed)
20 RYON FAY  06/11/2012   Your procedure is scheduled on:  Friday 06-19-2012  Report to [facility] at  AM. [phone]  Call this number if you have problems the morning of surgery: [phone]   Remember:   Do not drink liquids or  eat food:After Midnight.Thursday 06-18-2012      Take these medicines the morning of surgery with A SIP OF WATER: Metoprolol   Do not wear jewelry, make-up or nail polish.  Do not wear lotions, powders, or perfumes. You may wear deodorant.  Do not shave 48 hours prior to surgery. Men may shave face and neck.  Do not bring valuables to the hospital.  Contacts, dentures or bridgework may not be worn into surgery.  Leave suitcase in the car. After surgery it may be brought to your room.  For patients admitted to the hospital, checkout time is 11:00 AM the day of discharge.   Patients discharged the day of surgery will not be allowed to drive home.  Name and phone number of your driver: Gavin Pound wife [phone]  See [facility] Preparing for surgery sheet.   Please read over the following fact sheets that you were given: MRSA Information

## 2012-06-11 NOTE — Progress Notes (Signed)
CXR 11-09-2011 in Epic and EKG from [facility] 08-13-2011 on the chart

## 2012-06-17 NOTE — H&P (Signed)
History of Present Illness           F/u BPH and bladder cancer/CIS.   BPH Pt performing CIC since June 2009 when he presented with AUR and hydronephrosis. UDS in 2009 - large capacity, hyposensitive bladder, left hydronephrosis, large volumes measured 1700 to 2000, develop a 63 cm H20 bladder contraction. He started CIC.   He went to the ER Dec 2012 for left testicle pain and swelling. He was diagnosed with epididymtis after [place] (I reviewed the images - showed enlarged left Epi with reactive hydrocele). He was started on Cipro and hydrocodone. His testicle is improving.    Dec 2012 - cystoscopy showed trilobar hypertrophy and a large median lobe. PSA 3.5 with normal DRE.  Jan 2013 - TRUS 67 g prostate.  Jan 2013 UDS - bladder capacity of 293 mL that was slightly unstable with the patient was able to inhibit contractions. He generated a detrusor pressure of 56 cm of water but was unable to void with a flow of zero. The patient generated 2 good contractions. His EMG was normal.  Pt underwent TURP Nov 08, 2011. He has been voiding well. He had some areas of erythema in the bladder which were biopsied and revealed CIS.   PVR May 2013 - pt voided. Initial PVR 174 ml, then he had the urge to void again. He passed a large clot then voided clear yellow urine. Final PVR 110 ml  Bladder Ca/CIS -Apr 2013 - CIS right posterior and dome;  diagnosed on bladder biopsy during TURP -Aug 2013 completed BCG induction x 6 (induction delayed due to gross hematuria)    Interval history Patient returns and is voiding with a good stream. He has no incontinence. He has had no dysuria. He's had no hematuria.   Past Medical History Problems  1. History of  Acute Cystitis 595.0 2. History of  Cardiac Catheterization  (Diagnostic) 3. History of  Coronary Artery Disease V12.59 4. History of  Heart Disease 429.9 5. History of  Hydroureteronephrosis On The Left 591 6. History of  Hypercholesterolemia 272.0 7.  History of  Hypertension 401.9 8. History of  Hypogonadism 257.2 9. History of  Microscopic Hematuria 599.72  Surgical History Problems  1. History of  CABG (CABG) V45.81 2. History of  Cystoscopy With Fulguration Minor Lesion (Under 5mm) 3. History of  Tonsillectomy 4. History of  Transurethral Resection Of Prostate (TURP)  Current Meds 1. Aspirin Low Dose 81 MG Oral Tablet; Therapy: (Recorded:03Nov2008) to 2. Ciprofloxacin HCl 500 MG Oral Tablet; TAKE 1 TABLET BID; Therapy: 22Jul2013 to  (Evaluate:24Jul2013)  Requested for: 23Jul2013; Last Rx:22Jul2013 3. CoQ10 CAPS; Therapy: (Recorded:17Apr2013) to 4. Crestor 20 MG Oral Tablet; Therapy: (Recorded:19Dec2012) to 5. Docusate Sodium 100 MG Oral Capsule; Therapy: (Recorded:17Apr2013) to 6. Metoprolol Succinate ER 50 MG Oral Tablet Extended Release 24 Hour; Therapy:  (Recorded:17Apr2013) to 7. Multi-Vitamin TABS; Therapy: (Recorded:17Apr2013) to 8. Ramipril 10 MG Oral Capsule; Therapy: (Recorded:30Jan2013) to 9. Saw Palmetto Plus CAPS; Therapy: (Recorded:30Jan2013) to 10. Tamsulosin HCl 0.4 MG Oral Capsule; TAKE 1 CAPSULE Bedtime; Therapy: 13Apr2013 to   (Evaluate:19Jun2014)  Requested for: 24Jun2013; Last Rx:24Jun2013  Allergies Medication  1. No Known Drug Allergies  Family History Problems  1. Maternal history of  Breast Cancer V16.3 2. Paternal history of  Death In The Family Father 39- died during angioplasty sgy 3. Paternal history of  Death In The Family Mother 79- breast ca 4. Family history of  Family Health Status Number Of Children 2 sons  5. Paternal history of  Heart Disease V17.49 6. Paternal history of  Hypercholesterolemia  Social History Problems  1. Marital History - Currently Married 2. Never A Smoker 3. Occupation: Field seismologist Denied  4. History of  Alcohol Use 5. History of  Caffeine Use 6. History of  Tobacco Use V15.82  Review of Systems Constitutional, cardiovascular, pulmonary and  neurological system(s) were reviewed and pertinent findings if present are noted.    Vitals Vital Signs [Data Includes: Last 1 Day]  06Nov2013 02:13PM  Blood Pressure: 102 / 70 Temperature: 97.7 F Heart Rate: 66  Physical Exam Constitutional: Well nourished and well developed . No acute distress.  Pulmonary: No respiratory distress and normal respiratory rhythm and effort.  Cardiovascular: Heart rate and rhythm are normal . No peripheral edema.  Neuro/Psych:. Mood and affect are appropriate.    Results/Data Urine [Data Includes: Last 1 Day]   06Nov2013  COLOR YELLOW   APPEARANCE CLOUDY   SPECIFIC GRAVITY 1.025   pH 5.5   GLUCOSE NEG mg/dL  BILIRUBIN NEG   KETONE NEG mg/dL  BLOOD NEG   PROTEIN NEG mg/dL  UROBILINOGEN 0.2 mg/dL  NITRITE NEG   LEUKOCYTE ESTERASE MOD   SQUAMOUS EPITHELIAL/HPF RARE   WBC 21-50 WBC/hpf  RBC 0-2 RBC/hpf  BACTERIA MANY   CRYSTALS NONE SEEN   CASTS Granular casts noted    Procedure  Procedure: Cystoscopy   Informed Consent: Risks, benefits, and potential adverse events were discussed and informed consent was obtained from the patient.  Prep: The patient was prepped with betadine.  Antibiotic prophylaxis: Ciprofloxacin . Sent to pharmacy.  Procedure Note:  Urethral meatus:. No abnormalities.  Anterior urethra: No abnormalities.  Prostatic urethra: No abnormalities.  Patent with good TURP defect  Bladder: Visulization was clear. The ureteral orifices were in the normal anatomic position bilaterally and had clear efflux of urine. A systematic survey of the bladder demonstrated no bladder tumors or stones. The mucosa was smooth without abnormalities. Examination of the bladder demonstrated erythematous mucosa located toward the midline, near the dome of the bladder. The patient tolerated the procedure well.  Complications: None.    Assessment Assessed  1. Carcinoma In Situ Of The Bladder 233.7 2. Benign Prostatic Hyperplasia Localized With  Urinary Obstruction With Other Lower Urinary Tract  Symptoms 600.21  Plan  Carcinoma In Situ Of The Bladder (233.7)  1. Follow-up Schedule Surgery Office  Follow-up  Requested for: 06Nov2013 Urinary Tract Infection (599.0)  2. Ciprofloxacin HCl 500 MG Oral Tablet; Take 1 tablet twice daily; Therapy: 06Nov2013 to  (Evaluate:11Nov2013); Last Rx:06Nov2013  URINE CYTOLOGY  Status: In Progress - Specimen/Data Collected  Done: 06Nov2013 Ordered Today; For: Carcinoma In Situ Of The Bladder (233.7); Ordered By: Jamie Kato  Due: 11Nov2013 Marked Important; Last Updated By: Thomasenia Sales  URINE CULTURE  Status: In Progress - Specimen/Data Collected  Done: 06Nov2013 Ordered Today; For: Asymptomatic Bacteruria (791.9); Ordered By: Jamie Kato  Due: 08Nov2013 Marked Important; Last Updated By: Thomasenia Sales   Discussion/Summary        Urine sent for cytology. Cystoscopy today shows some areas of erythema posteriorly and toward the dome. I discussed the findings with the patient. We discussed the nature risks benefits and alternatives to cystoscopy with targeted and random bladder biopsies and fulguration. All questions answered and he elects to proceed.      Signatures Electronically signed by : Jerilee Field, M.D.; Jun 03 2012  3:14PM

## 2012-06-18 ENCOUNTER — Other Ambulatory Visit: Payer: Self-pay | Admitting: Dermatology

## 2012-06-18 DIAGNOSIS — Z85820 Personal history of malignant melanoma of skin: Secondary | ICD-10-CM | POA: Diagnosis not present

## 2012-06-18 DIAGNOSIS — D239 Other benign neoplasm of skin, unspecified: Secondary | ICD-10-CM | POA: Diagnosis not present

## 2012-06-18 DIAGNOSIS — D485 Neoplasm of uncertain behavior of skin: Secondary | ICD-10-CM | POA: Diagnosis not present

## 2012-06-18 DIAGNOSIS — D0460 Carcinoma in situ of skin of unspecified upper limb, including shoulder: Secondary | ICD-10-CM | POA: Diagnosis not present

## 2012-06-18 DIAGNOSIS — Z85828 Personal history of other malignant neoplasm of skin: Secondary | ICD-10-CM | POA: Diagnosis not present

## 2012-06-18 DIAGNOSIS — C44611 Basal cell carcinoma of skin of unspecified upper limb, including shoulder: Secondary | ICD-10-CM | POA: Diagnosis not present

## 2012-06-18 DIAGNOSIS — C44519 Basal cell carcinoma of skin of other part of trunk: Secondary | ICD-10-CM | POA: Diagnosis not present

## 2012-06-18 DIAGNOSIS — L570 Actinic keratosis: Secondary | ICD-10-CM | POA: Diagnosis not present

## 2012-06-19 ENCOUNTER — Encounter (HOSPITAL_COMMUNITY): Payer: Self-pay | Admitting: *Deleted

## 2012-06-19 ENCOUNTER — Ambulatory Visit (HOSPITAL_COMMUNITY): Payer: Medicare Other | Admitting: Anesthesiology

## 2012-06-19 ENCOUNTER — Encounter (HOSPITAL_COMMUNITY): Payer: Self-pay | Admitting: Anesthesiology

## 2012-06-19 ENCOUNTER — Encounter (HOSPITAL_COMMUNITY)
Admission: RE | Disposition: A | Discharge status: Home / Self Care | Payer: Self-pay | Source: Ambulatory Visit | Attending: Urology

## 2012-06-19 ENCOUNTER — Ambulatory Visit (HOSPITAL_COMMUNITY)
Admission: RE | Admit: 2012-06-19 | Discharge: 2012-06-19 | Disposition: A | Discharge status: Home / Self Care | Payer: Medicare Other | Source: Ambulatory Visit | Attending: Urology | Admitting: Urology

## 2012-06-19 DIAGNOSIS — E7800 Pure hypercholesterolemia, unspecified: Secondary | ICD-10-CM | POA: Insufficient documentation

## 2012-06-19 DIAGNOSIS — D090 Carcinoma in situ of bladder: Secondary | ICD-10-CM | POA: Diagnosis not present

## 2012-06-19 DIAGNOSIS — I10 Essential (primary) hypertension: Secondary | ICD-10-CM | POA: Insufficient documentation

## 2012-06-19 DIAGNOSIS — Z951 Presence of aortocoronary bypass graft: Secondary | ICD-10-CM | POA: Insufficient documentation

## 2012-06-19 DIAGNOSIS — N3090 Cystitis, unspecified without hematuria: Secondary | ICD-10-CM | POA: Diagnosis not present

## 2012-06-19 DIAGNOSIS — I2510 Atherosclerotic heart disease of native coronary artery without angina pectoris: Secondary | ICD-10-CM | POA: Diagnosis not present

## 2012-06-19 DIAGNOSIS — Z7982 Long term (current) use of aspirin: Secondary | ICD-10-CM | POA: Insufficient documentation

## 2012-06-19 DIAGNOSIS — N401 Enlarged prostate with lower urinary tract symptoms: Secondary | ICD-10-CM | POA: Diagnosis not present

## 2012-06-19 HISTORY — PX: CYSTOSCOPY WITH BIOPSY: SHX5122

## 2012-06-19 SURGERY — CYSTOSCOPY, WITH BIOPSY
Anesthesia: General | Site: Bladder | Wound class: Clean Contaminated

## 2012-06-19 MED ORDER — ACETAMINOPHEN 10 MG/ML IV SOLN
INTRAVENOUS | Status: AC
  Filled 2012-06-19: qty 100

## 2012-06-19 MED ORDER — HYDROMORPHONE HCL PF 1 MG/ML IJ SOLN: 0.2500 mg | INTRAMUSCULAR | Status: DC | PRN

## 2012-06-19 MED ORDER — ACETAMINOPHEN 10 MG/ML IV SOLN
INTRAVENOUS | Status: DC | PRN
  Administered 2012-06-19: 1000 mg via INTRAVENOUS

## 2012-06-19 MED ORDER — MEPERIDINE HCL 50 MG/ML IJ SOLN: 6.2500 mg | INTRAMUSCULAR | Status: DC | PRN

## 2012-06-19 MED ORDER — LACTATED RINGERS IV SOLN
INTRAVENOUS | Status: DC | PRN
  Administered 2012-06-19: 12:00:00 via INTRAVENOUS

## 2012-06-19 MED ORDER — ONDANSETRON HCL 4 MG/2ML IJ SOLN
INTRAMUSCULAR | Status: DC | PRN
  Administered 2012-06-19 (×2): 2 mg via INTRAVENOUS

## 2012-06-19 MED ORDER — PROMETHAZINE HCL 25 MG/ML IJ SOLN: 6.2500 mg | INTRAMUSCULAR | Status: DC | PRN

## 2012-06-19 MED ORDER — CEFAZOLIN SODIUM-DEXTROSE 2-3 GM-% IV SOLR
2.0000 g | INTRAVENOUS | Status: AC
  Administered 2012-06-19: 2 g via INTRAVENOUS

## 2012-06-19 MED ORDER — CEPHALEXIN 500 MG PO CAPS: 500.0000 mg | ORAL_CAPSULE | Freq: Two times a day (BID) | ORAL | Status: DC

## 2012-06-19 MED ORDER — CEFAZOLIN SODIUM-DEXTROSE 2-3 GM-% IV SOLR
INTRAVENOUS | Status: AC
  Filled 2012-06-19: qty 50

## 2012-06-19 MED ORDER — GLYCOPYRROLATE 0.2 MG/ML IJ SOLN
INTRAMUSCULAR | Status: DC | PRN
  Administered 2012-06-19 (×2): 0.2 mg via INTRAVENOUS

## 2012-06-19 MED ORDER — PROPOFOL 10 MG/ML IV EMUL
INTRAVENOUS | Status: DC | PRN
  Administered 2012-06-19: 150 mg via INTRAVENOUS
  Administered 2012-06-19 (×2): 10 mg via INTRAVENOUS

## 2012-06-19 MED ORDER — OXYCODONE HCL 5 MG/5ML PO SOLN
5.0000 mg | Freq: Once | ORAL | Status: DC | PRN
  Filled 2012-06-19: qty 5

## 2012-06-19 MED ORDER — OXYCODONE HCL 5 MG PO TABS: 5.0000 mg | ORAL_TABLET | Freq: Once | ORAL | Status: DC | PRN

## 2012-06-19 MED ORDER — LIDOCAINE HCL (CARDIAC) 20 MG/ML IV SOLN
INTRAVENOUS | Status: DC | PRN
  Administered 2012-06-19: 20 mg via INTRAVENOUS

## 2012-06-19 MED ORDER — FENTANYL CITRATE 0.05 MG/ML IJ SOLN
INTRAMUSCULAR | Status: DC | PRN
  Administered 2012-06-19 (×2): 25 ug via INTRAVENOUS
  Administered 2012-06-19: 50 ug via INTRAVENOUS

## 2012-06-19 MED ORDER — LACTATED RINGERS IV SOLN
INTRAVENOUS | Status: DC
  Administered 2012-06-19: 1000 mL via INTRAVENOUS

## 2012-06-19 MED ORDER — STERILE WATER FOR IRRIGATION IR SOLN
Status: DC | PRN
  Administered 2012-06-19: 3000 mL

## 2012-06-19 MED ORDER — URIBEL 118 MG PO CAPS: 1.0000 | ORAL_CAPSULE | Freq: Four times a day (QID) | ORAL | Status: DC | PRN

## 2012-06-19 MED ORDER — ACETAMINOPHEN 10 MG/ML IV SOLN: 1000.0000 mg | Freq: Once | INTRAVENOUS | Status: DC | PRN

## 2012-06-19 SURGICAL SUPPLY — 21 items
BAG URINE DRAINAGE (UROLOGICAL SUPPLIES) IMPLANT
BAG URO CATCHER STRL LF (DRAPE) ×3 IMPLANT
CATH ROBINSON RED A/P 16FR (CATHETERS) IMPLANT
CLOTH BEACON ORANGE TIMEOUT ST (SAFETY) ×3 IMPLANT
DRAPE CAMERA CLOSED 9X96 (DRAPES) ×3 IMPLANT
ELECT LOOP MED HF 24F 12D (CUTTING LOOP) IMPLANT
ELECT REM PT RETURN 9FT ADLT (ELECTROSURGICAL) ×3
ELECTRODE REM PT RTRN 9FT ADLT (ELECTROSURGICAL) ×2 IMPLANT
GLOVE BIOGEL M STRL SZ7.5 (GLOVE) ×3 IMPLANT
GOWN PREVENTION PLUS XLARGE (GOWN DISPOSABLE) ×3 IMPLANT
GOWN STRL NON-REIN LRG LVL3 (GOWN DISPOSABLE) ×3 IMPLANT
GOWN STRL REIN XL XLG (GOWN DISPOSABLE) ×3 IMPLANT
KIT ASPIRATION TUBING (SET/KITS/TRAYS/PACK) IMPLANT
MANIFOLD NEPTUNE II (INSTRUMENTS) ×3 IMPLANT
NDL SAFETY ECLIPSE 18X1.5 (NEEDLE) IMPLANT
NEEDLE HYPO 18GX1.5 SHARP (NEEDLE)
NEEDLE HYPO 22GX1.5 SAFETY (NEEDLE) ×3 IMPLANT
PACK CYSTO (CUSTOM PROCEDURE TRAY) ×3 IMPLANT
SYRINGE IRR TOOMEY STRL 70CC (SYRINGE) IMPLANT
TUBING CONNECTING 10 (TUBING) ×3 IMPLANT
WATER STERILE IRR 3000ML UROMA (IV SOLUTION) ×3 IMPLANT

## 2012-06-19 NOTE — Op Note (Signed)
Preoperative diagnosis: CIS of bladder Postoperative diagnosis: CIS of bladder  Procedure: Exam under anesthesia, cystoscopy random bladder biopsy  Surgeon: Mena Goes  Anesthesia: Gen.  Findings:  Exam under anesthesia-the testicles were descended and normal without masses. The penis was normal without mass. On digital rectal exam the prostate was palpably normal without heart area or nodule. There were abdominal masses or suprapubic masses on palpation. On cystoscopy the urethra appeared normal. The prostatic urethra had an excellent TURP defect with a patent bladder neck. The bladder had mild trabeculation but otherwise was completely normal without areas of erythema, neoplasm, stone or foreign body. The trigone and ureteral orifice these were normal and there was good clear eflux of urine bilaterally.  Description of procedure: After consent was obtained patient brought to the operating room. A timeout was performed to confirm the patient and procedure. After adequate anesthesia he is placed in lithotomy position and exam under anesthesia was performed. He was prepped and draped in the usual sterile fashion then a cystoscope was passed per urethra and the bladder examined in its entirety. There were no suspicious areas to biopsy therefore using flexible biopsy forceps a biopsy of the right, posterior, left, dome(left) of the bladder was obtained and these areas fulgurated. The dome biopsy was very superficial therefore a second right biopsy was taken. The first one was obtained from left of the midline. The second biopsy was fulgurated. Under local pressure there was excellent hemostasis of all biopsy sites. The scope was removed after the bladder drained. The patient was awakened taken to the recovery room in stable condition.  Estimated blood loss: Minimal  Complications: None  Drains: None  Specimens: #1 right bladder #2 posterior bladder #3 left bladder #4 dome left #5 dome  right Disposition to pathology  Patient disposition: Patient stable to PACU.

## 2012-06-19 NOTE — Transfer of Care (Signed)
Immediate Anesthesia Transfer of Care Note  Patient: Jacob Hensley  Procedure(s) Performed: Procedure(s) (LRB) with comments: CYSTOSCOPY WITH BIOPSY (N/A) - fulgaration of bleeders  Patient Location: PACU  Anesthesia Type:General  Level of Consciousness: awake and sedated  Airway & Oxygen Therapy: Patient Spontanous Breathing and Patient connected to face mask oxygen  Post-op Assessment: Report given to PACU RN and Post -op Vital signs reviewed and stable  Post vital signs: Reviewed and stable  Complications: No apparent anesthesia complications

## 2012-06-19 NOTE — Anesthesia Preprocedure Evaluation (Addendum)
Anesthesia Evaluation  Patient identified by MRN, date of birth, ID band Patient awake    Reviewed: Allergy & Precautions, H&P , NPO status , Patient's Chart, lab work & pertinent test results, reviewed documented beta blocker date and time   Airway Mallampati: II TM Distance: >3 FB Neck ROM: Full    Dental  (+) Dental Advisory Given   Pulmonary neg pulmonary ROS,  breath sounds clear to auscultation        Cardiovascular hypertension, Pt. on medications and Pt. on home beta blockers + CAD and + CABG Rhythm:Regular Rate:Normal  CAD, s/p CABG 2003 Currently asymptomatic   Neuro/Psych negative neurological ROS  negative psych ROS   GI/Hepatic negative GI ROS, Neg liver ROS,   Endo/Other  diabetes, Well Controlled, Type 2Borderline DM, diet controll  Renal/GU negative Renal ROS   BPH    Musculoskeletal negative musculoskeletal ROS (+)   Abdominal   Peds negative pediatric ROS (+)  Hematology negative hematology ROS (+)   Anesthesia Other Findings   Reproductive/Obstetrics negative OB ROS                          Anesthesia Physical  Anesthesia Plan  ASA: III  Anesthesia Plan: General   Post-op Pain Management:    Induction: Intravenous  Airway Management Planned: LMA and Oral ETT  Additional Equipment:   Intra-op Plan:   Post-operative Plan: Extubation in OR  Informed Consent: I have reviewed the patients History and Physical, chart, labs and discussed the procedure including the risks, benefits and alternatives for the proposed anesthesia with the patient or authorized representative who has indicated his/her understanding and acceptance.   Dental advisory given  Plan Discussed with: CRNA  Anesthesia Plan Comments:        Anesthesia Quick Evaluation

## 2012-06-19 NOTE — Anesthesia Postprocedure Evaluation (Signed)
Anesthesia Post Note  Patient: Jacob Hensley  Procedure(s) Performed: Procedure(s) (LRB): CYSTOSCOPY WITH BIOPSY (N/A)  Anesthesia type: General  Patient location: PACU  Post pain: Pain level controlled  Post assessment: Post-op Vital signs reviewed  Last Vitals: BP 145/81  Pulse 50  Temp 36.4 C  Resp 16  SpO2 99%  Post vital signs: Reviewed  Level of consciousness: sedated  Complications: No apparent anesthesia complications

## 2012-06-19 NOTE — Interval H&P Note (Signed)
History and Physical Interval Note:  06/19/2012 11:54 AM  Jacob Hensley  has presented today for surgery, with the diagnosis of Bladder Cancer In Situ  The various methods of treatment have been discussed with the patient and family. After consideration of risks, benefits and other options for treatment, the patient has consented to  Procedure(s) (LRB) with comments: CYSTOSCOPY WITH BIOPSY (N/A) TRANSURETHRAL RESECTION OF BLADDER TUMOR (TURBT) (N/A) - FULGERATION OF BLADDER TUMOR  as a surgical intervention .  The patient's history has been reviewed, patient examined, no change in status, stable for surgery.  I have reviewed the patient's chart and labs.  Questions were answered to the patient's satisfaction.  He elects to proceed.    Antony Haste

## 2012-06-22 ENCOUNTER — Encounter (HOSPITAL_COMMUNITY): Payer: Self-pay | Admitting: Urology

## 2012-07-06 DIAGNOSIS — R339 Retention of urine, unspecified: Secondary | ICD-10-CM | POA: Diagnosis not present

## 2012-07-06 DIAGNOSIS — R310 Gross hematuria: Secondary | ICD-10-CM | POA: Diagnosis not present

## 2012-07-06 DIAGNOSIS — D090 Carcinoma in situ of bladder: Secondary | ICD-10-CM | POA: Diagnosis not present

## 2012-07-30 DIAGNOSIS — Z951 Presence of aortocoronary bypass graft: Secondary | ICD-10-CM | POA: Diagnosis not present

## 2012-07-30 DIAGNOSIS — I2510 Atherosclerotic heart disease of native coronary artery without angina pectoris: Secondary | ICD-10-CM | POA: Diagnosis not present

## 2012-08-13 DIAGNOSIS — Z23 Encounter for immunization: Secondary | ICD-10-CM | POA: Diagnosis not present

## 2012-08-13 DIAGNOSIS — Z0000 Encounter for general adult medical examination without abnormal findings: Secondary | ICD-10-CM | POA: Diagnosis not present

## 2012-08-13 DIAGNOSIS — I10 Essential (primary) hypertension: Secondary | ICD-10-CM | POA: Diagnosis not present

## 2012-08-13 DIAGNOSIS — E785 Hyperlipidemia, unspecified: Secondary | ICD-10-CM | POA: Diagnosis not present

## 2012-08-13 DIAGNOSIS — E119 Type 2 diabetes mellitus without complications: Secondary | ICD-10-CM | POA: Diagnosis not present

## 2012-08-17 DIAGNOSIS — I2510 Atherosclerotic heart disease of native coronary artery without angina pectoris: Secondary | ICD-10-CM | POA: Diagnosis not present

## 2012-08-17 DIAGNOSIS — Z79899 Other long term (current) drug therapy: Secondary | ICD-10-CM | POA: Diagnosis not present

## 2012-08-17 DIAGNOSIS — Z951 Presence of aortocoronary bypass graft: Secondary | ICD-10-CM | POA: Diagnosis not present

## 2012-08-17 HISTORY — PX: OTHER SURGICAL HISTORY: SHX169

## 2012-08-20 DIAGNOSIS — I10 Essential (primary) hypertension: Secondary | ICD-10-CM | POA: Diagnosis not present

## 2012-08-20 DIAGNOSIS — E785 Hyperlipidemia, unspecified: Secondary | ICD-10-CM | POA: Diagnosis not present

## 2012-08-20 DIAGNOSIS — H6120 Impacted cerumen, unspecified ear: Secondary | ICD-10-CM | POA: Diagnosis not present

## 2012-08-20 DIAGNOSIS — E1129 Type 2 diabetes mellitus with other diabetic kidney complication: Secondary | ICD-10-CM | POA: Diagnosis not present

## 2012-08-20 DIAGNOSIS — N182 Chronic kidney disease, stage 2 (mild): Secondary | ICD-10-CM | POA: Diagnosis not present

## 2012-08-20 DIAGNOSIS — I2510 Atherosclerotic heart disease of native coronary artery without angina pectoris: Secondary | ICD-10-CM | POA: Diagnosis not present

## 2012-08-31 DIAGNOSIS — D090 Carcinoma in situ of bladder: Secondary | ICD-10-CM | POA: Diagnosis not present

## 2012-09-07 DIAGNOSIS — D090 Carcinoma in situ of bladder: Secondary | ICD-10-CM | POA: Diagnosis not present

## 2012-09-14 DIAGNOSIS — D090 Carcinoma in situ of bladder: Secondary | ICD-10-CM | POA: Diagnosis not present

## 2012-11-02 DIAGNOSIS — D090 Carcinoma in situ of bladder: Secondary | ICD-10-CM | POA: Diagnosis not present

## 2012-11-02 DIAGNOSIS — N401 Enlarged prostate with lower urinary tract symptoms: Secondary | ICD-10-CM | POA: Diagnosis not present

## 2012-12-17 DIAGNOSIS — L570 Actinic keratosis: Secondary | ICD-10-CM | POA: Diagnosis not present

## 2012-12-17 DIAGNOSIS — Z85820 Personal history of malignant melanoma of skin: Secondary | ICD-10-CM | POA: Diagnosis not present

## 2012-12-17 DIAGNOSIS — Z85828 Personal history of other malignant neoplasm of skin: Secondary | ICD-10-CM | POA: Diagnosis not present

## 2012-12-17 DIAGNOSIS — D485 Neoplasm of uncertain behavior of skin: Secondary | ICD-10-CM | POA: Diagnosis not present

## 2012-12-17 DIAGNOSIS — L821 Other seborrheic keratosis: Secondary | ICD-10-CM | POA: Diagnosis not present

## 2013-01-18 DIAGNOSIS — N390 Urinary tract infection, site not specified: Secondary | ICD-10-CM | POA: Diagnosis not present

## 2013-01-18 DIAGNOSIS — R350 Frequency of micturition: Secondary | ICD-10-CM | POA: Diagnosis not present

## 2013-03-09 DIAGNOSIS — E1129 Type 2 diabetes mellitus with other diabetic kidney complication: Secondary | ICD-10-CM | POA: Diagnosis not present

## 2013-03-09 DIAGNOSIS — I10 Essential (primary) hypertension: Secondary | ICD-10-CM | POA: Diagnosis not present

## 2013-03-09 DIAGNOSIS — D090 Carcinoma in situ of bladder: Secondary | ICD-10-CM | POA: Diagnosis not present

## 2013-03-16 DIAGNOSIS — D090 Carcinoma in situ of bladder: Secondary | ICD-10-CM | POA: Diagnosis not present

## 2013-03-17 DIAGNOSIS — I10 Essential (primary) hypertension: Secondary | ICD-10-CM | POA: Diagnosis not present

## 2013-03-17 DIAGNOSIS — E785 Hyperlipidemia, unspecified: Secondary | ICD-10-CM | POA: Diagnosis not present

## 2013-03-17 DIAGNOSIS — E1129 Type 2 diabetes mellitus with other diabetic kidney complication: Secondary | ICD-10-CM | POA: Diagnosis not present

## 2013-03-17 DIAGNOSIS — I2510 Atherosclerotic heart disease of native coronary artery without angina pectoris: Secondary | ICD-10-CM | POA: Diagnosis not present

## 2013-03-24 ENCOUNTER — Other Ambulatory Visit: Payer: Self-pay | Admitting: Dermatology

## 2013-03-24 DIAGNOSIS — L570 Actinic keratosis: Secondary | ICD-10-CM | POA: Diagnosis not present

## 2013-03-24 DIAGNOSIS — C44621 Squamous cell carcinoma of skin of unspecified upper limb, including shoulder: Secondary | ICD-10-CM | POA: Diagnosis not present

## 2013-03-24 DIAGNOSIS — Z85828 Personal history of other malignant neoplasm of skin: Secondary | ICD-10-CM | POA: Diagnosis not present

## 2013-03-24 DIAGNOSIS — D485 Neoplasm of uncertain behavior of skin: Secondary | ICD-10-CM | POA: Diagnosis not present

## 2013-04-26 DIAGNOSIS — N400 Enlarged prostate without lower urinary tract symptoms: Secondary | ICD-10-CM | POA: Diagnosis not present

## 2013-05-03 DIAGNOSIS — D090 Carcinoma in situ of bladder: Secondary | ICD-10-CM | POA: Diagnosis not present

## 2013-05-03 DIAGNOSIS — N400 Enlarged prostate without lower urinary tract symptoms: Secondary | ICD-10-CM | POA: Diagnosis not present

## 2013-06-10 ENCOUNTER — Telehealth: Payer: Self-pay | Admitting: *Deleted

## 2013-06-10 NOTE — Telephone Encounter (Signed)
^^  Walk-In^^  Pt in office requesting records for CDL.  Needs copy of last cardiology evaluation/tests and clearacne to turn in w/ Medical Examiner's Certificate.  Pt informed he will need appt w/ Dr. Salena Saner for evaluation and letter of clearance.  Pt verbalized understanding and agreed w/ plan.  Pt called job while in office.   Pt needs:   1) Letter stating appt for evaluation in January (last year's letter in paper chart) - Dr. Sanjuana Mae, CMA 2) Copy of latest test(s) - Medical Records 3) January appt w/ Dr. Salena Saner - Scheduling

## 2013-06-21 ENCOUNTER — Other Ambulatory Visit: Payer: Self-pay | Admitting: Dermatology

## 2013-06-21 DIAGNOSIS — L570 Actinic keratosis: Secondary | ICD-10-CM | POA: Diagnosis not present

## 2013-06-21 DIAGNOSIS — C44611 Basal cell carcinoma of skin of unspecified upper limb, including shoulder: Secondary | ICD-10-CM | POA: Diagnosis not present

## 2013-06-21 DIAGNOSIS — Z85828 Personal history of other malignant neoplasm of skin: Secondary | ICD-10-CM | POA: Diagnosis not present

## 2013-06-21 DIAGNOSIS — L821 Other seborrheic keratosis: Secondary | ICD-10-CM | POA: Diagnosis not present

## 2013-06-21 DIAGNOSIS — Z85820 Personal history of malignant melanoma of skin: Secondary | ICD-10-CM | POA: Diagnosis not present

## 2013-06-21 DIAGNOSIS — D485 Neoplasm of uncertain behavior of skin: Secondary | ICD-10-CM | POA: Diagnosis not present

## 2013-06-21 DIAGNOSIS — C44519 Basal cell carcinoma of skin of other part of trunk: Secondary | ICD-10-CM | POA: Diagnosis not present

## 2013-07-31 ENCOUNTER — Encounter: Payer: Self-pay | Admitting: *Deleted

## 2013-08-02 ENCOUNTER — Encounter: Payer: Self-pay | Admitting: Cardiovascular Disease

## 2013-08-03 ENCOUNTER — Encounter: Payer: Self-pay | Admitting: Cardiovascular Disease

## 2013-08-03 ENCOUNTER — Ambulatory Visit (INDEPENDENT_AMBULATORY_CARE_PROVIDER_SITE_OTHER): Payer: Medicare Other | Admitting: Cardiovascular Disease

## 2013-08-03 VITALS — BP 138/80 | HR 49 | Ht 69.0 in | Wt 187.8 lb

## 2013-08-03 DIAGNOSIS — I10 Essential (primary) hypertension: Secondary | ICD-10-CM

## 2013-08-03 DIAGNOSIS — I2510 Atherosclerotic heart disease of native coronary artery without angina pectoris: Secondary | ICD-10-CM

## 2013-08-03 DIAGNOSIS — I25810 Atherosclerosis of coronary artery bypass graft(s) without angina pectoris: Secondary | ICD-10-CM

## 2013-08-03 DIAGNOSIS — E785 Hyperlipidemia, unspecified: Secondary | ICD-10-CM | POA: Diagnosis not present

## 2013-08-03 NOTE — Patient Instructions (Signed)
Your physician discussed the importance of regular exercise and recommended that you start or continue a regular exercise program for good health.  Your physician recommends that you schedule a follow-up appointment in: ONE YEAR.

## 2013-08-06 DIAGNOSIS — I2510 Atherosclerotic heart disease of native coronary artery without angina pectoris: Secondary | ICD-10-CM | POA: Insufficient documentation

## 2013-08-06 DIAGNOSIS — E785 Hyperlipidemia, unspecified: Secondary | ICD-10-CM | POA: Insufficient documentation

## 2013-08-06 DIAGNOSIS — I10 Essential (primary) hypertension: Secondary | ICD-10-CM | POA: Insufficient documentation

## 2013-08-06 NOTE — Assessment & Plan Note (Signed)
Good blood pressure control 

## 2013-08-06 NOTE — Assessment & Plan Note (Signed)
He is completely asymptomatic despite a very active lifestyle. I don't think his EKG changes are of major significance but it is not unreasonable to perform a simple treadmill stress test on a periodic basis (last assessed was in 2011). I think he can continue driving without restrictions.

## 2013-08-06 NOTE — Assessment & Plan Note (Signed)
In 2013 his total cholesterol is 156 triglycerides 103 HDL 45 LDL 90. It is time to repeat his profile

## 2013-08-06 NOTE — Progress Notes (Signed)
Patient ID: Jacob Hensley, male   DOB: [birth year], 68 y.o.   MRN: [id number]     Reason for office visit Followup coronary artery disease and risk factors  Jacob Hensley is a healthy and fit 68 year old who is now almost 12 years status post coronary bypass surgery. 3 of his bypasses were arterial conduits. He has not had any health problems since his last appointment one year ago. He continues to be active running on the treadmill for an hour every day. He runs on a very predictable regimen where he completes a total of 4-1/2 miles during his one-hour. He keeps inclined between 4 and 5%. He does not have chest pain or dyspnea during activity and has not felt the need to curtail his physical activity. There have been no changes in his medicines he continues to take aspirin, statin, beta blocker and an ACE inhibitor. He has long-standing mild bradycardia with heart rates around 50 beats per minutes and has no complaints of dizziness or syncope.   No Known Allergies  Current Outpatient Prescriptions  Medication Sig Dispense Refill  . aspirin 81 MG tablet Take 1 tablet (81 mg total) by mouth at bedtime.  30 tablet    . metoprolol (TOPROL-XL) 50 MG 24 hr tablet Take 50 mg by mouth daily before breakfast.       . OVER THE COUNTER MEDICATION VITALIZER GOLD  - SHAKELEE FOOD SUPPLEMENT      . ramipril (ALTACE) 10 MG capsule Take 10 mg by mouth daily after breakfast.       . rosuvastatin (CRESTOR) 20 MG tablet Take 20 mg by mouth at bedtime.        No current facility-administered medications for this visit.    Past Medical History  Diagnosis Date  . Hypertension   . Urine retention     PT STATES HE HAS BEEN DOING I&O CATHS FOR PAST 3 YRS  . Hyperlipidemia   . Diabetes mellitus     BORDERLINE - PT CHECKS HIS BLOOD SUGARS AT HOME - NO MEDS  . BPH (benign prostatic hyperplasia)   . DVT of leg (deep venous thrombosis) 2003    S/P CABG SURGERY  . Coronary artery disease     CABG 2003-DR.  Aleph Hensley IS PT'S CARDIOLOGIST - LAST OFFICE VISIT 08/02/11-PT EXERCISES DAILY-NO C/O OF CHEST PAIN    Past Surgical History  Procedure Laterality Date  . Coronary artery bypass graft  02/05/2002    LIMA to LAD,RIMA to PDA,SVG to diagonal & free left radial arter to the oblique marginal artery - Dr. Cyndia Hensley  . Tonsillectomy    . Cystoscopy with biopsy  11/08/2011    Procedure: CYSTOSCOPY WITH BIOPSY;  Surgeon: Jacob Bonine, MD;  Location: [facility];  Service: Urology;;  Bladder biopsies  . Transurethral resection of prostate  11-08-2011  . Myolema of back  2012  . Cystoscopy with biopsy  06/19/2012    Procedure: CYSTOSCOPY WITH BIOPSY;  Surgeon: Jacob Bonine, MD;  Location: [facility];  Service: Urology;  Laterality: N/A;  fulgaration of bleeders  . US echocardiography  04/14/2009    EF >50%,borderline dilated RV w/normal systolic fx,mildly dilated LA,mild AI,MR,TR.  Marland Kitchen Cpet w/pft  1//20/2014    Normal    Family History  Problem Relation Age of Onset  . Diabetes Father   . Cancer Mother     Breast  . Heart failure Father     History   Social History  . Marital Status: Married  Spouse Name: N/A    Number of Children: N/A  . Years of Education: N/A   Occupational History  . Not on file.   Social History Main Topics  . Smoking status: Never Smoker   . Smokeless tobacco: Never Used  . Alcohol Use: No  . Drug Use: No  . Sexual Activity:    Other Topics Concern  . Not on file   Social History Narrative  . No narrative on file    Review of systems: The patient specifically denies any chest pain at rest or with exertion, dyspnea at rest or with exertion, orthopnea, paroxysmal nocturnal dyspnea, syncope, palpitations, focal neurological deficits, intermittent claudication, lower extremity edema, unexplained weight gain, cough, hemoptysis or wheezing.  The patient also denies abdominal pain, nausea, vomiting, dysphagia, diarrhea, constipation, polyuria,  polydipsia, dysuria, hematuria, frequency, urgency, abnormal bleeding or bruising, fever, chills, unexpected weight changes, mood swings, change in skin or hair texture, change in voice quality, auditory or visual problems, allergic reactions or rashes, new musculoskeletal complaints other than usual "aches and pains".   PHYSICAL EXAM BP 138/80  Pulse 49  Ht 5\' 9"  (1.753 m)  Wt 187 lb 12.8 oz (85.186 kg)  BMI 27.72 kg/m2  General: Alert, oriented x3, no distress Head: no evidence of trauma, PERRL, EOMI, no exophtalmos or lid lag, no myxedema, no xanthelasma; normal ears, nose and oropharynx Neck: normal jugular venous pulsations and no hepatojugular reflux; brisk carotid pulses without delay and no carotid bruits Chest: clear to auscultation, no signs of consolidation by percussion or palpation, normal fremitus, symmetrical and full respiratory excursions, sternotomy scar Cardiovascular: normal position and quality of the apical impulse, regular rhythm, normal first and second heart sounds, no murmurs, rubs or gallops Abdomen: no tenderness or distention, no masses by palpation, no abnormal pulsatility or arterial bruits, normal bowel sounds, no hepatosplenomegaly Extremities: no clubbing, cyanosis or edema; 2+ radial, ulnar and brachial pulses bilaterally; 2+ right femoral, posterior tibial and dorsalis pedis pulses; 2+ left femoral, posterior tibial and dorsalis pedis pulses; no subclavian or femoral bruits Neurological: grossly nonfocal   EKG: Sinus bradycardia, first degree AV block (PR interval 220 ms), nonspecific T wave abnormality (slightly inverted T waves in V5 and V6). Comparing this EKG to previous tracings there has be subtle variation in the T wave morphology in his lateral leads over the years, but they do appear a little more pronounced todayen  Lipid Panel  No results found for this basename: chol, trig, hdl, cholhdl, vldl, ldlcalc    BMET    Component Value Date/Time    NA 139 06/11/2012 1405   K 3.9 06/11/2012 1405   CL 103 06/11/2012 1405   CO2 25 06/11/2012 1405   GLUCOSE 110* 06/11/2012 1405   BUN 21 06/11/2012 1405   CREATININE 0.91 06/11/2012 1405   CALCIUM 9.6 06/11/2012 1405   GFRNONAA 86* 06/11/2012 1405   GFRAA >90 06/11/2012 1405     ASSESSMENT AND PLAN CAD s/p CABG He is completely asymptomatic despite a very active lifestyle. I don't think his EKG changes are of major significance but it is not unreasonable to perform a simple treadmill stress test on a periodic basis (last assessed was in 2011). I think he can continue driving without restrictions.  Essential hypertension Good blood pressure control  Hyperlipidemia In 2013 his total cholesterol is 156 triglycerides 103 HDL 45 LDL 90. It is time to repeat his profile   Patient Instructions  Your physician discussed the importance of regular  exercise and recommended that you start or continue a regular exercise program for good health.  Your physician recommends that you schedule a follow-up appointment in: ONE YEAR.     Orders Placed This Encounter  Procedures  . EKG 12-Lead  . Exercise Tolerance Test   No orders of the defined types were placed in this encounter.    Holli Humbles, MD, [place] [phone] office [phone] pager

## 2013-08-10 DIAGNOSIS — R059 Cough, unspecified: Secondary | ICD-10-CM | POA: Diagnosis not present

## 2013-08-10 DIAGNOSIS — R05 Cough: Secondary | ICD-10-CM | POA: Diagnosis not present

## 2013-08-10 DIAGNOSIS — J069 Acute upper respiratory infection, unspecified: Secondary | ICD-10-CM | POA: Diagnosis not present

## 2013-08-11 ENCOUNTER — Encounter (HOSPITAL_COMMUNITY): Payer: PRIVATE HEALTH INSURANCE

## 2013-08-19 ENCOUNTER — Ambulatory Visit (HOSPITAL_COMMUNITY)
Admission: RE | Admit: 2013-08-19 | Discharge: 2013-08-19 | Disposition: A | Discharge status: Home / Self Care | Payer: Medicare Other | Source: Ambulatory Visit | Attending: Internal Medicine | Admitting: Internal Medicine

## 2013-08-19 DIAGNOSIS — I25810 Atherosclerosis of coronary artery bypass graft(s) without angina pectoris: Secondary | ICD-10-CM | POA: Diagnosis not present

## 2013-09-10 ENCOUNTER — Telehealth: Payer: Self-pay | Admitting: Cardiovascular Disease

## 2013-09-10 NOTE — Telephone Encounter (Signed)
Returned call and pt verified x 2.  Pt informed results not reviewed by MD yet.  Informed Dr. Jobe Marker will be notified.  Pt verbalized understanding and agreed w/ plan.  Message forwarded to Dr. Darlen Round, CMA.  Results under "Media" tab.

## 2013-09-10 NOTE — Telephone Encounter (Signed)
Stress test was normal. Please apologize for delay - report never made it to my in basket. [place]

## 2013-09-10 NOTE — Telephone Encounter (Signed)
Had Stress test 3 weeks ago and still have not gotten the results.

## 2013-09-10 NOTE — Telephone Encounter (Signed)
Call to pt and results given per MD.  Pt verbalized understanding.  Pt would like a copy of results mailed to him.    Message forwarded to Medical Records to mail a copy of Stress Test results.

## 2013-09-15 DIAGNOSIS — E1129 Type 2 diabetes mellitus with other diabetic kidney complication: Secondary | ICD-10-CM | POA: Diagnosis not present

## 2013-09-15 DIAGNOSIS — Z1331 Encounter for screening for depression: Secondary | ICD-10-CM | POA: Diagnosis not present

## 2013-09-15 DIAGNOSIS — E663 Overweight: Secondary | ICD-10-CM | POA: Diagnosis not present

## 2013-09-15 DIAGNOSIS — I2510 Atherosclerotic heart disease of native coronary artery without angina pectoris: Secondary | ICD-10-CM | POA: Diagnosis not present

## 2013-09-15 DIAGNOSIS — Z125 Encounter for screening for malignant neoplasm of prostate: Secondary | ICD-10-CM | POA: Diagnosis not present

## 2013-09-15 DIAGNOSIS — I10 Essential (primary) hypertension: Secondary | ICD-10-CM | POA: Diagnosis not present

## 2013-09-15 DIAGNOSIS — Z0000 Encounter for general adult medical examination without abnormal findings: Secondary | ICD-10-CM | POA: Diagnosis not present

## 2013-09-22 ENCOUNTER — Other Ambulatory Visit: Payer: Self-pay | Admitting: Internal Medicine

## 2013-09-22 ENCOUNTER — Ambulatory Visit
Admission: RE | Admit: 2013-09-22 | Discharge: 2013-09-22 | Disposition: A | Discharge status: Home / Self Care | Payer: Medicare Other | Source: Ambulatory Visit | Attending: Internal Medicine | Admitting: Internal Medicine

## 2013-09-22 DIAGNOSIS — M79604 Pain in right leg: Secondary | ICD-10-CM

## 2013-09-22 DIAGNOSIS — Z86718 Personal history of other venous thrombosis and embolism: Secondary | ICD-10-CM

## 2013-09-22 DIAGNOSIS — E1129 Type 2 diabetes mellitus with other diabetic kidney complication: Secondary | ICD-10-CM | POA: Diagnosis not present

## 2013-09-22 DIAGNOSIS — I10 Essential (primary) hypertension: Secondary | ICD-10-CM | POA: Diagnosis not present

## 2013-09-22 DIAGNOSIS — M7989 Other specified soft tissue disorders: Secondary | ICD-10-CM

## 2013-09-22 DIAGNOSIS — N182 Chronic kidney disease, stage 2 (mild): Secondary | ICD-10-CM | POA: Diagnosis not present

## 2013-09-22 DIAGNOSIS — I2510 Atherosclerotic heart disease of native coronary artery without angina pectoris: Secondary | ICD-10-CM | POA: Diagnosis not present

## 2013-09-22 DIAGNOSIS — M79609 Pain in unspecified limb: Secondary | ICD-10-CM | POA: Diagnosis not present

## 2013-10-06 DIAGNOSIS — N529 Male erectile dysfunction, unspecified: Secondary | ICD-10-CM | POA: Diagnosis not present

## 2013-10-06 DIAGNOSIS — D090 Carcinoma in situ of bladder: Secondary | ICD-10-CM | POA: Diagnosis not present

## 2013-10-14 DIAGNOSIS — E291 Testicular hypofunction: Secondary | ICD-10-CM | POA: Diagnosis not present

## 2013-10-14 DIAGNOSIS — R141 Gas pain: Secondary | ICD-10-CM | POA: Diagnosis not present

## 2013-10-14 DIAGNOSIS — Z1211 Encounter for screening for malignant neoplasm of colon: Secondary | ICD-10-CM | POA: Diagnosis not present

## 2013-10-14 DIAGNOSIS — Z86010 Personal history of colonic polyps: Secondary | ICD-10-CM | POA: Diagnosis not present

## 2013-10-14 DIAGNOSIS — R143 Flatulence: Secondary | ICD-10-CM | POA: Diagnosis not present

## 2013-11-29 DIAGNOSIS — E291 Testicular hypofunction: Secondary | ICD-10-CM | POA: Diagnosis not present

## 2013-12-14 ENCOUNTER — Other Ambulatory Visit: Payer: Self-pay | Admitting: Dermatology

## 2013-12-14 DIAGNOSIS — L570 Actinic keratosis: Secondary | ICD-10-CM | POA: Diagnosis not present

## 2013-12-14 DIAGNOSIS — L821 Other seborrheic keratosis: Secondary | ICD-10-CM | POA: Diagnosis not present

## 2013-12-14 DIAGNOSIS — D239 Other benign neoplasm of skin, unspecified: Secondary | ICD-10-CM | POA: Diagnosis not present

## 2013-12-14 DIAGNOSIS — Z85828 Personal history of other malignant neoplasm of skin: Secondary | ICD-10-CM | POA: Diagnosis not present

## 2013-12-14 DIAGNOSIS — L820 Inflamed seborrheic keratosis: Secondary | ICD-10-CM | POA: Diagnosis not present

## 2013-12-14 DIAGNOSIS — C4441 Basal cell carcinoma of skin of scalp and neck: Secondary | ICD-10-CM | POA: Diagnosis not present

## 2013-12-14 DIAGNOSIS — D485 Neoplasm of uncertain behavior of skin: Secondary | ICD-10-CM | POA: Diagnosis not present

## 2013-12-15 DIAGNOSIS — Z86010 Personal history of colonic polyps: Secondary | ICD-10-CM | POA: Diagnosis not present

## 2013-12-15 DIAGNOSIS — D126 Benign neoplasm of colon, unspecified: Secondary | ICD-10-CM | POA: Diagnosis not present

## 2013-12-15 DIAGNOSIS — Z1211 Encounter for screening for malignant neoplasm of colon: Secondary | ICD-10-CM | POA: Diagnosis not present

## 2013-12-15 DIAGNOSIS — K5730 Diverticulosis of large intestine without perforation or abscess without bleeding: Secondary | ICD-10-CM | POA: Diagnosis not present

## 2014-03-15 DIAGNOSIS — I10 Essential (primary) hypertension: Secondary | ICD-10-CM | POA: Diagnosis not present

## 2014-03-15 DIAGNOSIS — E1129 Type 2 diabetes mellitus with other diabetic kidney complication: Secondary | ICD-10-CM | POA: Diagnosis not present

## 2014-03-22 DIAGNOSIS — I10 Essential (primary) hypertension: Secondary | ICD-10-CM | POA: Diagnosis not present

## 2014-03-22 DIAGNOSIS — E1129 Type 2 diabetes mellitus with other diabetic kidney complication: Secondary | ICD-10-CM | POA: Diagnosis not present

## 2014-03-22 DIAGNOSIS — E785 Hyperlipidemia, unspecified: Secondary | ICD-10-CM | POA: Diagnosis not present

## 2014-03-22 DIAGNOSIS — N182 Chronic kidney disease, stage 2 (mild): Secondary | ICD-10-CM | POA: Diagnosis not present

## 2014-04-18 DIAGNOSIS — D090 Carcinoma in situ of bladder: Secondary | ICD-10-CM | POA: Diagnosis not present

## 2014-05-04 DIAGNOSIS — N401 Enlarged prostate with lower urinary tract symptoms: Secondary | ICD-10-CM | POA: Diagnosis not present

## 2014-05-09 DIAGNOSIS — D090 Carcinoma in situ of bladder: Secondary | ICD-10-CM | POA: Diagnosis not present

## 2014-05-09 DIAGNOSIS — E291 Testicular hypofunction: Secondary | ICD-10-CM | POA: Diagnosis not present

## 2014-05-09 DIAGNOSIS — N400 Enlarged prostate without lower urinary tract symptoms: Secondary | ICD-10-CM | POA: Diagnosis not present

## 2014-06-15 ENCOUNTER — Other Ambulatory Visit: Payer: Self-pay | Admitting: Dermatology

## 2014-06-15 DIAGNOSIS — L570 Actinic keratosis: Secondary | ICD-10-CM | POA: Diagnosis not present

## 2014-06-15 DIAGNOSIS — Z85820 Personal history of malignant melanoma of skin: Secondary | ICD-10-CM | POA: Diagnosis not present

## 2014-06-15 DIAGNOSIS — L821 Other seborrheic keratosis: Secondary | ICD-10-CM | POA: Diagnosis not present

## 2014-06-15 DIAGNOSIS — E118 Type 2 diabetes mellitus with unspecified complications: Secondary | ICD-10-CM | POA: Diagnosis not present

## 2014-06-15 DIAGNOSIS — Z85828 Personal history of other malignant neoplasm of skin: Secondary | ICD-10-CM | POA: Diagnosis not present

## 2014-06-15 DIAGNOSIS — D0462 Carcinoma in situ of skin of left upper limb, including shoulder: Secondary | ICD-10-CM | POA: Diagnosis not present

## 2014-06-15 DIAGNOSIS — I10 Essential (primary) hypertension: Secondary | ICD-10-CM | POA: Diagnosis not present

## 2014-06-28 DIAGNOSIS — Z23 Encounter for immunization: Secondary | ICD-10-CM | POA: Diagnosis not present

## 2014-06-28 DIAGNOSIS — E1121 Type 2 diabetes mellitus with diabetic nephropathy: Secondary | ICD-10-CM | POA: Diagnosis not present

## 2014-06-28 DIAGNOSIS — I10 Essential (primary) hypertension: Secondary | ICD-10-CM | POA: Diagnosis not present

## 2014-06-28 DIAGNOSIS — I2510 Atherosclerotic heart disease of native coronary artery without angina pectoris: Secondary | ICD-10-CM | POA: Diagnosis not present

## 2014-06-28 DIAGNOSIS — E785 Hyperlipidemia, unspecified: Secondary | ICD-10-CM | POA: Diagnosis not present

## 2014-08-01 ENCOUNTER — Ambulatory Visit (INDEPENDENT_AMBULATORY_CARE_PROVIDER_SITE_OTHER): Payer: Medicare Other | Admitting: Cardiovascular Disease

## 2014-08-01 ENCOUNTER — Encounter: Payer: Self-pay | Admitting: Cardiovascular Disease

## 2014-08-01 VITALS — BP 114/74 | HR 51 | Resp 16 | Ht 69.0 in | Wt 170.0 lb

## 2014-08-01 DIAGNOSIS — I2510 Atherosclerotic heart disease of native coronary artery without angina pectoris: Secondary | ICD-10-CM | POA: Diagnosis not present

## 2014-08-01 DIAGNOSIS — T50905A Adverse effect of unspecified drugs, medicaments and biological substances, initial encounter: Secondary | ICD-10-CM | POA: Diagnosis not present

## 2014-08-01 DIAGNOSIS — R001 Bradycardia, unspecified: Secondary | ICD-10-CM | POA: Diagnosis not present

## 2014-08-01 NOTE — Patient Instructions (Signed)
Your physician has requested that you have an exercise tolerance test. For further information please visit [email or web address]. Please also follow instruction sheet, as given.  Dr. Sallyanne Kuster recommends that you schedule a follow-up appointment in: One year.

## 2014-08-01 NOTE — Progress Notes (Signed)
Patient ID: Jacob Hensley, male   DOB: 06-07-1946, 69 y.o.   MRN: [id number]      Reason for office visit  CADs/p CABG, hyperlipidemia  Jacob Hensley  Has drastically improved his diet and level of physical activity and he has lost about 18 pounds this year. He feels and looks great. He is in no way limited by any cardiovascular symptoms during activity. During the recent trip to  [place] he walked across the [facility] and back at a brisk pace without complaints. He is very physically active.  Roughly 13 years ago he underwent bypass surgery , mostly with arterial conduits ( LIMA to LAD, RIMA to PDA, left radial to OM, SVG to diagonal).  He has asymptomatic long-standing bradycardia with heart rates run 50 bpm.More than 10 years have passed since his coronary bypass surgery. 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   No Known Allergies  Current Outpatient Prescriptions  Medication Sig Dispense Refill  . aspirin 81 MG tablet Take 1 tablet (81 mg total) by mouth at bedtime. 30 tablet   . metoprolol (TOPROL-XL) 50 MG 24 hr tablet Take 50 mg by mouth daily before breakfast.     . OVER THE COUNTER MEDICATION VITALIZER GOLD  - SHAKELEE FOOD SUPPLEMENT    . ramipril (ALTACE) 10 MG capsule Take 10 mg by mouth daily after breakfast.     . rosuvastatin (CRESTOR) 20 MG tablet Take 20 mg by mouth at bedtime.      No current facility-administered medications for this visit.    Past Medical History  Diagnosis Date  . Hypertension   . Urine retention     PT STATES HE HAS BEEN DOING I&O CATHS FOR PAST 3 YRS  . Hyperlipidemia   . Diabetes mellitus     BORDERLINE - PT CHECKS HIS BLOOD SUGARS AT HOME - NO MEDS  . BPH (benign prostatic hyperplasia)   . DVT of leg (deep venous thrombosis) 2003    S/P CABG SURGERY  . Coronary artery disease     CABG 2003-DR. Wael Maestas IS PT'S CARDIOLOGIST - LAST OFFICE VISIT 08/02/11-PT EXERCISES DAILY-NO C/O  OF CHEST PAIN    Past Surgical History  Procedure Laterality Date  . Coronary artery bypass graft  02/05/2002    LIMA to LAD,RIMA to PDA,SVG to diagonal & free left radial arter to the oblique marginal artery - Dr. Cyndia Bent  . Tonsillectomy    . Cystoscopy with biopsy  11/08/2011    Procedure: CYSTOSCOPY WITH BIOPSY;  Surgeon: Fredricka Bonine, MD;  Location: [facility];  Service: Urology;;  Bladder biopsies  . Transurethral resection of prostate  11-08-2011  . Myolema of back  2012  . Cystoscopy with biopsy  06/19/2012    Procedure: CYSTOSCOPY WITH BIOPSY;  Surgeon: Fredricka Bonine, MD;  Location: [facility];  Service: Urology;  Laterality: N/A;  fulgaration of bleeders  . US echocardiography  04/14/2009    EF >50%,borderline dilated RV w/normal systolic fx,mildly dilated LA,mild AI,MR,TR.  Marland Kitchen Cpet w/pft  1//20/2014    Normal    Family History  Problem Relation Age of Onset  . Diabetes Father   . Cancer Mother     Breast  . Heart failure Father     History   Social History  . Marital Status: Married    Spouse Name: N/A    Number of Children: N/A  . Years of Education: N/A   Occupational History  .  Not on file.   Social History Main Topics  . Smoking status: Never Smoker   . Smokeless tobacco: Never Used  . Alcohol Use: No  . Drug Use: No  . Sexual Activity: Not on file   Other Topics Concern  . Not on file   Social History Narrative    Review of systems: The patient specifically denies any chest pain at rest or with exertion, dyspnea at rest or with exertion, orthopnea, paroxysmal nocturnal dyspnea, syncope, palpitations, focal neurological deficits, intermittent claudication, lower extremity edema, unexplained weight gain, cough, hemoptysis or wheezing.  The patient also denies abdominal pain, nausea, vomiting, dysphagia, diarrhea, constipation, polyuria, polydipsia, dysuria, hematuria, frequency, urgency, abnormal bleeding or bruising, fever, chills,  unexpected weight changes, mood swings, change in skin or hair texture, change in voice quality, auditory or visual problems, allergic reactions or rashes, new musculoskeletal complaints other than usual "aches and pains".   PHYSICAL EXAM BP 114/74 mmHg  Pulse 51  Resp 16  Ht 5\' 9"  (1.753 m)  Wt 170 lb (77.111 kg)  BMI 25.09 kg/m2  General: Alert, oriented x3, no distress Head: no evidence of trauma, PERRL, EOMI, no exophtalmos or lid lag, no myxedema, no xanthelasma; normal ears, nose and oropharynx Neck: normal jugular venous pulsations and no hepatojugular reflux; brisk carotid pulses without delay and no carotid bruits Chest: clear to auscultation, no signs of consolidation by percussion or palpation, normal fremitus, symmetrical and full respiratory excursions,  Sternotomy scar Cardiovascular: normal position and quality of the apical impulse, regular rhythm, normal first and second heart sounds, no murmurs, rubs or gallops Abdomen: no tenderness or distention, no masses by palpation, no abnormal pulsatility or arterial bruits, normal bowel sounds, no hepatosplenomegaly Extremities: no clubbing, cyanosis or edema; 2+ radial, ulnar and brachial pulses bilaterally; 2+ right femoral, posterior tibial and dorsalis pedis pulses; 2+ left femoral, posterior tibial and dorsalis pedis pulses; no subclavian or femoral bruits Neurological: grossly nonfocal   EKG:  Marked sinus bradycardia and mild first-degree A-V block (PR 226 ms), mild nonspecific T-wave changes in the lateral leads, similar to previous tracings  Lipid Panel/Labs  Jun 15 2014 Dr. Noah Delaine  total cholesterol 131, triglycerides 56, HDL  47, LDL 73  Hemoglobin A1c 6%  TSH 0.84 Hemoglobin 14.6, BUN 20, creatinine 0.9, glucose 105 , potassium 4.0 , normal liver function tests   ASSESSMENT AND PLAN   Jacob Hensley has excellent functional status, a physically active lifestyle and a sensible diet. His weight is now well within  the desirable range and this is reflected in improved LDL cholesterol levels. Despite his efforts he still has a borderline hemoglobin A1c at 6% and this is motivation to maintain low body weight.  We'll schedule him for a treadmill stress test, on which I suspect he will do well.  Yearly follow-up. If he should develop any symptoms of bradycardia (syncope, dizziness, weakness, fatigue, etc.) I would reduce his beta blocker dose in half.  Orders Placed This Encounter  Procedures  . EKG 12-Lead  . Exercise Tolerance Test   No orders of the defined types were placed in this encounter.    Holli Humbles, MD, [place] [phone] office [phone] pager

## 2014-08-04 ENCOUNTER — Encounter: Payer: Self-pay | Admitting: Cardiovascular Disease

## 2014-08-05 ENCOUNTER — Encounter (HOSPITAL_COMMUNITY): Payer: Self-pay | Admitting: *Deleted

## 2014-08-12 ENCOUNTER — Telehealth (HOSPITAL_COMMUNITY): Payer: Self-pay

## 2014-08-12 NOTE — Telephone Encounter (Signed)
Encounter complete. 

## 2014-08-17 ENCOUNTER — Ambulatory Visit (HOSPITAL_COMMUNITY)
Admission: RE | Admit: 2014-08-17 | Discharge: 2014-08-17 | Disposition: A | Discharge status: Home / Self Care | Payer: Medicare Other | Source: Ambulatory Visit | Attending: Cardiology | Admitting: Cardiology

## 2014-08-17 DIAGNOSIS — I2510 Atherosclerotic heart disease of native coronary artery without angina pectoris: Secondary | ICD-10-CM

## 2014-08-17 NOTE — Procedures (Signed)
Exercise Treadmill Test   Test  Exercise Tolerance Test Ordering MD: Jerilynn Mages. Croitoru, MD  Interpreting MD:   Unique Test No: 1  Treadmill:  1  Indication for ETT: Follow-UP CAD  Contraindication to ETT: No   Stress Modality: exercise - treadmill  Cardiac Imaging Performed: non   Protocol: standard Bruce - maximal  Max BP:  214/94  Max MPHR (bpm):  152 85% MPR (bpm): 129  MPHR obtained (bpm):  153 % MPHR obtained: 100  Reached 85% MPHR (min:sec):  9:55 Total Exercise Time (min-sec): 15:11  Workload in METS: 17.40 Borg Scale:   Reason ETT Terminated:  fatigue    ST Segment Analysis At Rest: NSR with no ST changes With Exercise: significant ischemic ST depression  Other Information Arrhythmia:  No Angina during ETT:  absent (0) Quality of ETT:  diagnostic  ETT Interpretation:  abnormal - evidence of ST depression consistent with ischemia  Comments: ETT with excellent exercise tolerance (15:11); no chest pain; hypertensive response (peak SBP 214); ST depression in the inferior lateral leads with peak exertion and recovery; abnormal ETT.  Kirk Ruths

## 2014-08-23 ENCOUNTER — Encounter: Payer: Self-pay | Admitting: Cardiovascular Disease

## 2014-09-20 DIAGNOSIS — Z125 Encounter for screening for malignant neoplasm of prostate: Secondary | ICD-10-CM | POA: Diagnosis not present

## 2014-09-20 DIAGNOSIS — Z23 Encounter for immunization: Secondary | ICD-10-CM | POA: Diagnosis not present

## 2014-09-20 DIAGNOSIS — I10 Essential (primary) hypertension: Secondary | ICD-10-CM | POA: Diagnosis not present

## 2014-09-20 DIAGNOSIS — Z1389 Encounter for screening for other disorder: Secondary | ICD-10-CM | POA: Diagnosis not present

## 2014-09-20 DIAGNOSIS — E1121 Type 2 diabetes mellitus with diabetic nephropathy: Secondary | ICD-10-CM | POA: Diagnosis not present

## 2014-09-20 DIAGNOSIS — M8580 Other specified disorders of bone density and structure, unspecified site: Secondary | ICD-10-CM | POA: Diagnosis not present

## 2014-09-20 DIAGNOSIS — Z0000 Encounter for general adult medical examination without abnormal findings: Secondary | ICD-10-CM | POA: Diagnosis not present

## 2014-10-13 DIAGNOSIS — E785 Hyperlipidemia, unspecified: Secondary | ICD-10-CM | POA: Diagnosis not present

## 2014-10-13 DIAGNOSIS — I2510 Atherosclerotic heart disease of native coronary artery without angina pectoris: Secondary | ICD-10-CM | POA: Diagnosis not present

## 2014-10-13 DIAGNOSIS — E1122 Type 2 diabetes mellitus with diabetic chronic kidney disease: Secondary | ICD-10-CM | POA: Diagnosis not present

## 2014-10-13 DIAGNOSIS — I10 Essential (primary) hypertension: Secondary | ICD-10-CM | POA: Diagnosis not present

## 2014-11-09 DIAGNOSIS — D090 Carcinoma in situ of bladder: Secondary | ICD-10-CM | POA: Diagnosis not present

## 2014-11-09 DIAGNOSIS — E291 Testicular hypofunction: Secondary | ICD-10-CM | POA: Diagnosis not present

## 2014-11-09 DIAGNOSIS — K4090 Unilateral inguinal hernia, without obstruction or gangrene, not specified as recurrent: Secondary | ICD-10-CM | POA: Diagnosis not present

## 2014-11-09 DIAGNOSIS — N400 Enlarged prostate without lower urinary tract symptoms: Secondary | ICD-10-CM | POA: Diagnosis not present

## 2014-12-14 DIAGNOSIS — D485 Neoplasm of uncertain behavior of skin: Secondary | ICD-10-CM | POA: Diagnosis not present

## 2014-12-14 DIAGNOSIS — Z85828 Personal history of other malignant neoplasm of skin: Secondary | ICD-10-CM | POA: Diagnosis not present

## 2014-12-14 DIAGNOSIS — L570 Actinic keratosis: Secondary | ICD-10-CM | POA: Diagnosis not present

## 2014-12-14 DIAGNOSIS — C44612 Basal cell carcinoma of skin of right upper limb, including shoulder: Secondary | ICD-10-CM | POA: Diagnosis not present

## 2014-12-14 DIAGNOSIS — C44519 Basal cell carcinoma of skin of other part of trunk: Secondary | ICD-10-CM | POA: Diagnosis not present

## 2014-12-14 DIAGNOSIS — Z85820 Personal history of malignant melanoma of skin: Secondary | ICD-10-CM | POA: Diagnosis not present

## 2015-04-17 DIAGNOSIS — E1122 Type 2 diabetes mellitus with diabetic chronic kidney disease: Secondary | ICD-10-CM | POA: Diagnosis not present

## 2015-04-17 DIAGNOSIS — I10 Essential (primary) hypertension: Secondary | ICD-10-CM | POA: Diagnosis not present

## 2015-05-09 DIAGNOSIS — N400 Enlarged prostate without lower urinary tract symptoms: Secondary | ICD-10-CM | POA: Diagnosis not present

## 2015-05-09 DIAGNOSIS — E291 Testicular hypofunction: Secondary | ICD-10-CM | POA: Diagnosis not present

## 2015-06-01 DIAGNOSIS — L728 Other follicular cysts of the skin and subcutaneous tissue: Secondary | ICD-10-CM | POA: Diagnosis not present

## 2015-06-01 DIAGNOSIS — L570 Actinic keratosis: Secondary | ICD-10-CM | POA: Diagnosis not present

## 2015-06-01 DIAGNOSIS — D485 Neoplasm of uncertain behavior of skin: Secondary | ICD-10-CM | POA: Diagnosis not present

## 2015-06-01 DIAGNOSIS — L820 Inflamed seborrheic keratosis: Secondary | ICD-10-CM | POA: Diagnosis not present

## 2015-06-01 DIAGNOSIS — D1801 Hemangioma of skin and subcutaneous tissue: Secondary | ICD-10-CM | POA: Diagnosis not present

## 2015-06-01 DIAGNOSIS — Z85820 Personal history of malignant melanoma of skin: Secondary | ICD-10-CM | POA: Diagnosis not present

## 2015-06-01 DIAGNOSIS — Z85828 Personal history of other malignant neoplasm of skin: Secondary | ICD-10-CM | POA: Diagnosis not present

## 2015-06-01 DIAGNOSIS — L400 Psoriasis vulgaris: Secondary | ICD-10-CM | POA: Diagnosis not present

## 2015-06-01 DIAGNOSIS — L821 Other seborrheic keratosis: Secondary | ICD-10-CM | POA: Diagnosis not present

## 2015-06-07 DIAGNOSIS — N400 Enlarged prostate without lower urinary tract symptoms: Secondary | ICD-10-CM | POA: Diagnosis not present

## 2015-06-07 DIAGNOSIS — D090 Carcinoma in situ of bladder: Secondary | ICD-10-CM | POA: Diagnosis not present

## 2015-08-02 ENCOUNTER — Encounter: Payer: Self-pay | Admitting: Cardiovascular Disease

## 2015-08-02 ENCOUNTER — Ambulatory Visit (HOSPITAL_COMMUNITY)
Admission: RE | Admit: 2015-08-02 | Discharge: 2015-08-02 | Disposition: A | Discharge status: Home / Self Care | Payer: Medicare Other | Source: Ambulatory Visit | Attending: Cardiovascular Disease | Admitting: Cardiovascular Disease

## 2015-08-02 ENCOUNTER — Ambulatory Visit (INDEPENDENT_AMBULATORY_CARE_PROVIDER_SITE_OTHER): Payer: Medicare Other | Admitting: Cardiovascular Disease

## 2015-08-02 VITALS — BP 148/80 | HR 54 | Ht 69.0 in | Wt 179.4 lb

## 2015-08-02 DIAGNOSIS — R001 Bradycardia, unspecified: Secondary | ICD-10-CM

## 2015-08-02 DIAGNOSIS — I25810 Atherosclerosis of coronary artery bypass graft(s) without angina pectoris: Secondary | ICD-10-CM

## 2015-08-02 DIAGNOSIS — T50905A Adverse effect of unspecified drugs, medicaments and biological substances, initial encounter: Secondary | ICD-10-CM

## 2015-08-02 DIAGNOSIS — E785 Hyperlipidemia, unspecified: Secondary | ICD-10-CM | POA: Diagnosis not present

## 2015-08-02 DIAGNOSIS — I10 Essential (primary) hypertension: Secondary | ICD-10-CM | POA: Diagnosis not present

## 2015-08-02 LAB — EXERCISE TOLERANCE TEST
Estimated workload: 17.2 METS
Exercise duration (min): 15 min
Exercise duration (sec): 1 s
MPHR: 151 {beats}/min
Peak HR: 146 {beats}/min
Percent HR: 96 %
RPE: 17
Rest HR: 52 {beats}/min

## 2015-08-02 NOTE — Patient Instructions (Signed)
Dr Sallyanne Kuster has made no changes today in your current medications or treatment plan.  Your physician has requested that you have an exercise tolerance test in 1 year. For further information please visit [email or web address]. Please also follow instruction sheet, as given.  Your physician recommends that you schedule a follow-up appointment in after you have completed your stress test. You will receive a reminder letter in the mail two months in advance. If you don't receive a letter, please call our office to schedule the follow-up appointment.  If you need a refill on your cardiac medications before your next appointment, please call your pharmacy.

## 2015-08-02 NOTE — Progress Notes (Signed)
Patient ID: Jacob Hensley, male   DOB: 03/16/1946, 71 y.o.   MRN: [id number]    Cardiology Office Note    Date:  08/02/2015   ID:  Jacob Hensley, DOB 10/11/1945, MRN [id number]  PCP:  Thressa Sheller, MD  Cardiologist:   Sanda Klein, MD   Chief Complaint  Patient presents with  . Follow-up    no chest pain, no shortness of breath,no  edema, no pain in legs, no cramping in legs, no lightheadedness or dizziness    History of Present Illness:  Jacob Hensley is a 70 y.o. male history of multivessel CABG in 2003, mostly with arterial conduits. 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 Over the holidays he worked for [employer] for 7 weeks and felt great even while doing relatively hard physical labor. He denies angina pectoris with activity. He did have some "gas-like"discomfort in his left chest, but this occurred at rest. He does not recall what type of symptoms he had before bypass surgery. He denies exertional dyspnea and has not had syncope, palpitations, focal neurological events, bleeding problems, leg edema or claudication. He has long-standing mild bradycardia, without complaints of dizziness or weakness.    Past Medical History  Diagnosis Date  . Hypertension   . Urine retention     PT STATES HE HAS BEEN DOING I&O CATHS FOR PAST 3 YRS  . Hyperlipidemia   . Diabetes mellitus     BORDERLINE - PT CHECKS HIS BLOOD SUGARS AT HOME - NO MEDS  . BPH (benign prostatic hyperplasia)   . DVT of leg (deep venous thrombosis) 2003    S/P CABG SURGERY  . Coronary artery disease     CABG 2003-DR. Tai Skelly IS PT'S CARDIOLOGIST - LAST OFFICE VISIT 08/02/11-PT EXERCISES DAILY-NO C/O OF CHEST PAIN    Past Surgical History  Procedure Laterality Date  . Coronary artery bypass graft  02/05/2002    LIMA to LAD,RIMA to PDA,SVG to diagonal & free left radial arter to the oblique marginal artery - Dr. Cyndia Bent  . Tonsillectomy    . Cystoscopy with biopsy  11/08/2011    Procedure: CYSTOSCOPY WITH  BIOPSY;  Surgeon: Fredricka Bonine, MD;  Location: [facility];  Service: Urology;;  Bladder biopsies  . Transurethral resection of prostate  11-08-2011  . Myolema of back  2012  . Cystoscopy with biopsy  06/19/2012    Procedure: CYSTOSCOPY WITH BIOPSY;  Surgeon: Fredricka Bonine, MD;  Location: [facility];  Service: Urology;  Laterality: N/A;  fulgaration of bleeders  . US echocardiography  04/14/2009    EF >50%,borderline dilated RV w/normal systolic fx,mildly dilated LA,mild AI,MR,TR.  Marland Kitchen Cpet w/pft  1//20/2014    Normal    Current Outpatient Prescriptions  Medication Sig Dispense Refill  . aspirin 81 MG tablet Take 1 tablet (81 mg total) by mouth at bedtime. 30 tablet   . metoprolol (TOPROL-XL) 50 MG 24 hr tablet Take 50 mg by mouth daily before breakfast.     . OVER THE COUNTER MEDICATION VITALIZER GOLD  - SHAKELEE FOOD SUPPLEMENT    . ramipril (ALTACE) 10 MG capsule Take 10 mg by mouth daily after breakfast.     . rosuvastatin (CRESTOR) 20 MG tablet Take 20 mg by mouth at bedtime.      No current facility-administered medications for this visit.    Allergies:   Review of patient's allergies indicates no known allergies.   Social History   Social History  . Marital Status: Married  Spouse Name: N/A  . Number of Children: N/A  . Years of Education: N/A   Social History Main Topics  . Smoking status: Never Smoker   . Smokeless tobacco: Never Used  . Alcohol Use: No  . Drug Use: No  . Sexual Activity: Not on file   Other Topics Concern  . Not on file   Social History Narrative     Family History:  The patient's family history includes Cancer in his mother; Diabetes in his father; Heart failure in his father.   ROS:   Please see the history of present illness.    Review of Systems  All other systems reviewed and are negative.   PHYSICAL EXAM:   VS:  BP 148/80 mmHg  Pulse 54  Ht 5\' 9"  (1.753 m)  Wt 179 lb 7 oz (81.392 kg)  BMI 26.49 kg/m2   GEN: Well  nourished, well developed, in no acute distress HEENT: normal Neck: no JVD, carotid bruits, or masses Cardiac: RRR; no murmurs, rubs, or gallops,no edema ; normal pulses in his right upper extremity and both lower extremities; scar of left radial harvested artery Respiratory:  clear to auscultation bilaterally, normal work of breathing GI: soft, nontender, nondistended, + BS MS: no deformity or atrophy Skin: warm and dry, no rash Neuro:  Alert and Oriented x 3, Strength and sensation are intact Psych: euthymic mood, full affect  Wt Readings from Last 3 Encounters:  08/02/15 179 lb 7 oz (81.392 kg)  08/01/14 170 lb (77.111 kg)  08/03/13 187 lb 12.8 oz (85.186 kg)      Studies/Labs Reviewed:   EKG:  EKG is ordered today.  The ekg ordered today demonstrates sinus bradycardia with nonspecific ST-T abnormalities. ST changes are actually less prominent than they were last year  Recent Labs: No results found for requested labs within last 365 days.   Lipid Panel Performed in Dr. Doristine Devoid office, will retrieve results Addendum 04/17/2015: Cholesterol 136, triglycerides 89, HDL 43, LDL 75, A1c 6.4%, normal LFTs, creatinine 0.9, hemoglobin 15.7, TSH 1.0  ASSESSMENT:    1. Coronary artery disease involving coronary bypass graft of native heart without angina pectoris   2. Hyperlipidemia   3. Drug-induced bradycardia   4. Essential hypertension       PLAN:  In order of problems listed above:  1. Asymptomatic, will perform treadmill stress test today. Hopefully he will be able to achieve target heart rate, although he took his beta blocker. 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 He is expected to have ST-T changes with exercise, "false positive" present since his  revascularization. 2. Satisfactory lipid profile   3. As long as he remains asymptomatic, continue beta blocker 4. His typical blood pressure at home is less than 135/80. No changes are made to his medications today. Will also look at his blood pressure response during treadmill exercise.   Addendum: He was able to exercise for over 15 minutes on a standard Bruce protocol without angina pectoris. He did develop ischemic ST segment depression, as he has done on previous treadmill stress tests. This is almost certainly a "false positive" response to exercise in a patient with previous bypass surgery. He did have a hypertensive response to exercise. No changes are made to his blood pressure  medications. He will call back with a week's worth of home blood pressure readings before we make a decision on changing medications.  Medication Adjustments/Labs and Tests Ordered: Current medicines are reviewed at length with the patient today.  Concerns regarding medicines are outlined above.  Medication changes, Labs and Tests ordered today are listed below. Patient Instructions  Dr Sallyanne Kuster has made no changes today in your current medications or treatment plan.  Your physician has requested that you have an exercise tolerance test in 1 year. For further information please visit [email or web address]. Please also follow instruction sheet, as given.  Your physician recommends that you schedule a follow-up appointment in after you have completed your stress test. You will receive a reminder letter in the mail two months in advance. If you don't receive a letter, please call our office to schedule the follow-up appointment.  If you need a refill on your cardiac medications before your next appointment, please call your pharmacy.      Mikael Spray, MD  08/02/2015 3:05 PM    [facility] [place], [place], [place]  [address] Phone: [phone]; Fax: [phone]

## 2015-08-03 DIAGNOSIS — D696 Thrombocytopenia, unspecified: Secondary | ICD-10-CM | POA: Diagnosis not present

## 2015-08-03 DIAGNOSIS — E1122 Type 2 diabetes mellitus with diabetic chronic kidney disease: Secondary | ICD-10-CM | POA: Diagnosis not present

## 2015-08-03 DIAGNOSIS — N182 Chronic kidney disease, stage 2 (mild): Secondary | ICD-10-CM | POA: Diagnosis not present

## 2015-08-03 DIAGNOSIS — I209 Angina pectoris, unspecified: Secondary | ICD-10-CM | POA: Diagnosis not present

## 2015-12-04 DIAGNOSIS — D1801 Hemangioma of skin and subcutaneous tissue: Secondary | ICD-10-CM | POA: Diagnosis not present

## 2015-12-04 DIAGNOSIS — D0471 Carcinoma in situ of skin of right lower limb, including hip: Secondary | ICD-10-CM | POA: Diagnosis not present

## 2015-12-04 DIAGNOSIS — Z85828 Personal history of other malignant neoplasm of skin: Secondary | ICD-10-CM | POA: Diagnosis not present

## 2015-12-04 DIAGNOSIS — D045 Carcinoma in situ of skin of trunk: Secondary | ICD-10-CM | POA: Diagnosis not present

## 2015-12-04 DIAGNOSIS — Z85820 Personal history of malignant melanoma of skin: Secondary | ICD-10-CM | POA: Diagnosis not present

## 2015-12-04 DIAGNOSIS — L718 Other rosacea: Secondary | ICD-10-CM | POA: Diagnosis not present

## 2015-12-04 DIAGNOSIS — L820 Inflamed seborrheic keratosis: Secondary | ICD-10-CM | POA: Diagnosis not present

## 2015-12-04 DIAGNOSIS — L821 Other seborrheic keratosis: Secondary | ICD-10-CM | POA: Diagnosis not present

## 2015-12-04 DIAGNOSIS — D485 Neoplasm of uncertain behavior of skin: Secondary | ICD-10-CM | POA: Diagnosis not present

## 2015-12-04 DIAGNOSIS — L570 Actinic keratosis: Secondary | ICD-10-CM | POA: Diagnosis not present

## 2015-12-06 DIAGNOSIS — Z0000 Encounter for general adult medical examination without abnormal findings: Secondary | ICD-10-CM | POA: Diagnosis not present

## 2015-12-06 DIAGNOSIS — D090 Carcinoma in situ of bladder: Secondary | ICD-10-CM | POA: Diagnosis not present

## 2015-12-06 DIAGNOSIS — N400 Enlarged prostate without lower urinary tract symptoms: Secondary | ICD-10-CM | POA: Diagnosis not present

## 2016-01-31 DIAGNOSIS — I129 Hypertensive chronic kidney disease with stage 1 through stage 4 chronic kidney disease, or unspecified chronic kidney disease: Secondary | ICD-10-CM | POA: Diagnosis not present

## 2016-01-31 DIAGNOSIS — Z125 Encounter for screening for malignant neoplasm of prostate: Secondary | ICD-10-CM | POA: Diagnosis not present

## 2016-01-31 DIAGNOSIS — Z0000 Encounter for general adult medical examination without abnormal findings: Secondary | ICD-10-CM | POA: Diagnosis not present

## 2016-01-31 DIAGNOSIS — E559 Vitamin D deficiency, unspecified: Secondary | ICD-10-CM | POA: Diagnosis not present

## 2016-01-31 DIAGNOSIS — E1122 Type 2 diabetes mellitus with diabetic chronic kidney disease: Secondary | ICD-10-CM | POA: Diagnosis not present

## 2016-01-31 DIAGNOSIS — M8580 Other specified disorders of bone density and structure, unspecified site: Secondary | ICD-10-CM | POA: Diagnosis not present

## 2016-02-07 DIAGNOSIS — I129 Hypertensive chronic kidney disease with stage 1 through stage 4 chronic kidney disease, or unspecified chronic kidney disease: Secondary | ICD-10-CM | POA: Diagnosis not present

## 2016-02-07 DIAGNOSIS — E1122 Type 2 diabetes mellitus with diabetic chronic kidney disease: Secondary | ICD-10-CM | POA: Diagnosis not present

## 2016-02-07 DIAGNOSIS — D696 Thrombocytopenia, unspecified: Secondary | ICD-10-CM | POA: Diagnosis not present

## 2016-02-07 DIAGNOSIS — N182 Chronic kidney disease, stage 2 (mild): Secondary | ICD-10-CM | POA: Diagnosis not present

## 2016-06-05 DIAGNOSIS — L570 Actinic keratosis: Secondary | ICD-10-CM | POA: Diagnosis not present

## 2016-06-05 DIAGNOSIS — Z85820 Personal history of malignant melanoma of skin: Secondary | ICD-10-CM | POA: Diagnosis not present

## 2016-06-05 DIAGNOSIS — D1801 Hemangioma of skin and subcutaneous tissue: Secondary | ICD-10-CM | POA: Diagnosis not present

## 2016-06-05 DIAGNOSIS — Z85828 Personal history of other malignant neoplasm of skin: Secondary | ICD-10-CM | POA: Diagnosis not present

## 2016-06-05 DIAGNOSIS — D485 Neoplasm of uncertain behavior of skin: Secondary | ICD-10-CM | POA: Diagnosis not present

## 2016-06-05 DIAGNOSIS — L821 Other seborrheic keratosis: Secondary | ICD-10-CM | POA: Diagnosis not present

## 2016-07-31 DIAGNOSIS — E1122 Type 2 diabetes mellitus with diabetic chronic kidney disease: Secondary | ICD-10-CM | POA: Diagnosis not present

## 2016-07-31 DIAGNOSIS — I129 Hypertensive chronic kidney disease with stage 1 through stage 4 chronic kidney disease, or unspecified chronic kidney disease: Secondary | ICD-10-CM | POA: Diagnosis not present

## 2016-07-31 DIAGNOSIS — E785 Hyperlipidemia, unspecified: Secondary | ICD-10-CM | POA: Diagnosis not present

## 2016-07-31 DIAGNOSIS — I10 Essential (primary) hypertension: Secondary | ICD-10-CM | POA: Diagnosis not present

## 2016-08-02 ENCOUNTER — Ambulatory Visit (INDEPENDENT_AMBULATORY_CARE_PROVIDER_SITE_OTHER): Payer: Medicare Other | Admitting: Cardiovascular Disease

## 2016-08-02 ENCOUNTER — Encounter: Payer: Self-pay | Admitting: Cardiovascular Disease

## 2016-08-02 VITALS — BP 140/88 | HR 53 | Wt 180.0 lb

## 2016-08-02 DIAGNOSIS — R001 Bradycardia, unspecified: Secondary | ICD-10-CM | POA: Diagnosis not present

## 2016-08-02 DIAGNOSIS — I2510 Atherosclerotic heart disease of native coronary artery without angina pectoris: Secondary | ICD-10-CM | POA: Diagnosis not present

## 2016-08-02 DIAGNOSIS — E7800 Pure hypercholesterolemia, unspecified: Secondary | ICD-10-CM | POA: Diagnosis not present

## 2016-08-02 DIAGNOSIS — I10 Essential (primary) hypertension: Secondary | ICD-10-CM | POA: Diagnosis not present

## 2016-08-02 DIAGNOSIS — T50905A Adverse effect of unspecified drugs, medicaments and biological substances, initial encounter: Secondary | ICD-10-CM

## 2016-08-02 NOTE — Progress Notes (Signed)
Patient ID: Jacob Hensley, male   DOB: 1945-09-26, 71 y.o.   MRN: [id number]    Cardiology Office Note    Date:  08/02/2016   ID:  Jacob Hensley, DOB 06/08/1946, MRN [id number]  PCP:  Thressa Sheller, MD  Cardiologist:   Sanda Klein, MD   Chief Complaint  Patient presents with  . Follow-up    History of Present Illness:  Jacob Hensley is a 71 y.o. male history of multivessel CABG in 2003, mostly with arterial conduits. 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 He denies angina pectoris with activity. He still occasionally has some "gas-like"discomfort in his left chest, but this occurrs only at rest. He does not recall what type of symptoms he had before bypass surgery. He denies exertional dyspnea and has not had syncope, palpitations, focal neurological events, bleeding problems, leg edema or claudication. He has long-standing mild bradycardia, without complaints of dizziness or weakness.  A year ago he was able to exercise for 15 minutes on the standard Bruce protocol. He is known to have a "false positive" ECG treadmill response.   Past Medical History:  Diagnosis Date  . BPH (benign prostatic hyperplasia)   . Coronary artery disease    CABG 2003-DR. Noelene Gang IS PT'S CARDIOLOGIST - LAST OFFICE VISIT 08/02/11-PT EXERCISES DAILY-NO C/O OF CHEST PAIN  . Diabetes mellitus    BORDERLINE - PT CHECKS HIS BLOOD SUGARS AT HOME - NO MEDS  . DVT of leg (deep venous thrombosis) ([place]) 2003   S/P CABG SURGERY  . Hyperlipidemia   . Hypertension   . Urine retention    PT STATES HE HAS BEEN DOING I&O CATHS FOR PAST 3 YRS    Past Surgical History:  Procedure Laterality Date  . CORONARY ARTERY BYPASS GRAFT  02/05/2002   LIMA to LAD,RIMA to PDA,SVG to diagonal & free left radial arter to the oblique marginal artery - Dr. Cyndia Bent  . CPET w/PFT  1//20/2014   Normal  .  CYSTOSCOPY WITH BIOPSY  11/08/2011   Procedure: CYSTOSCOPY WITH BIOPSY;  Surgeon: Fredricka Bonine, MD;  Location: [facility];  Service: Urology;;  Bladder biopsies  . CYSTOSCOPY WITH BIOPSY  06/19/2012   Procedure: CYSTOSCOPY WITH BIOPSY;  Surgeon: Fredricka Bonine, MD;  Location: [facility];  Service: Urology;  Laterality: N/A;  fulgaration of bleeders  . myolema of back  2012  . TONSILLECTOMY    . TRANSURETHRAL RESECTION OF PROSTATE  11-08-2011  . US ECHOCARDIOGRAPHY  04/14/2009   EF >50%,borderline dilated RV w/normal systolic fx,mildly dilated LA,mild AI,MR,TR.    Current Outpatient Prescriptions  Medication Sig Dispense Refill  . aspirin 81 MG tablet Take 1 tablet (81 mg total) by mouth at bedtime. 30 tablet   . metoprolol (TOPROL-XL) 50 MG 24 hr tablet Take 50 mg by mouth daily before breakfast.     . OVER THE COUNTER MEDICATION VITALIZER GOLD  - SHAKELEE FOOD SUPPLEMENT    . ramipril (ALTACE) 10 MG capsule Take 10 mg by mouth daily after breakfast.     . rosuvastatin (CRESTOR) 20 MG tablet Take 20 mg by mouth at bedtime.      No current facility-administered medications for this visit.     Allergies:   Patient has no known allergies.   Social History   Social History  . Marital status: Married    Spouse name: N/A  .  Number of children: N/A  . Years of education: N/A   Social History Main Topics  . Smoking status: Never Smoker  . Smokeless tobacco: Never Used  . Alcohol use No  . Drug use: No  . Sexual activity: Not on file   Other Topics Concern  . Not on file   Social History Narrative  . No narrative on file     Family History:  The patient's family history includes Cancer in his mother; Diabetes in his father; Heart failure in his father.   ROS:   Please see the history of present illness.    Review of Systems  All other systems reviewed and are negative.   PHYSICAL EXAM:   VS:  BP 140/88   Pulse (!) 53   Wt 180 lb (81.6 kg)   BMI 26.58 kg/m      Recheck BP 136/82 GEN: Well nourished, well developed, in no acute distress HEENT: normal Neck: no JVD, carotid bruits, or masses Cardiac: RRR; no murmurs, rubs, or gallops,no edema ; normal pulses in his right upper extremity and both lower extremities; scar of left radial harvested artery Respiratory:  clear to auscultation bilaterally, normal work of breathing GI: soft, nontender, nondistended, + BS MS: no deformity or atrophy Skin: warm and dry, no rash Neuro:  Alert and Oriented x 3, Strength and sensation are intact Psych: euthymic mood, full affect  Wt Readings from Last 3 Encounters:  08/02/16 180 lb (81.6 kg)  08/02/15 179 lb 7 oz (81.4 kg)  08/01/14 170 lb (77.1 kg)      Studies/Labs Reviewed:   EKG:  EKG is ordered today.  The ekg ordered today demonstrates sinus bradycardia with mild first-degree A-V block (PR 234 ms) with nonspecific ST-T abnormalities. ST changes are similar to past tracings  Recent Labs: No results found for requested labs within last 8760 hours.   Lipid Panel Performed in Dr. Doristine Devoid office, will retrieve results Addendum 04/17/2015: Cholesterol 143, triglycerides 73, HDL 43, LDL 85, A1c 6.4%, normal LFTs, eGFR 92, TSH 1.24  ASSESSMENT:    1. Coronary artery disease involving native coronary artery of native heart without angina pectoris       PLAN:  In order of problems listed above:  1. CAD s/p CABG 2003: Asymptomatic, will perform treadmill stress test today. Hopefully he will be able to achieve target heart rate, although he took his beta blocker. 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 He is expected to have ST-T changes with exercise, "false positive" present since his revascularization. 2. HLP: Satisfactory lipid profile   3. Bradycardia: As long as he remains  asymptomatic, continue beta blocker 4. HTN: His typical blood pressure at home is less than 135/80. No changes are made to his medications today. Will also review his blood pressure response during treadmill exercise.    Medication Adjustments/Labs and Tests Ordered: Current medicines are reviewed at length with the patient today.  Concerns regarding medicines are outlined above.  Medication changes, Labs and Tests ordered today are listed below. There are no Patient Instructions on file for this visit.    Signed, Sanda Klein, MD  08/02/2016 9:44 AM    [facility] [place], [place], [place]  [address] Phone: [phone]; Fax: [phone]

## 2016-08-02 NOTE — Patient Instructions (Signed)
Dr Sallyanne Kuster recommends that you continue on your current medications as directed. Please refer to the Current Medication list given to you today.  Your physician has requested that you have an exercise tolerance test. For further information please visit [email or web address]. Please also follow instruction sheet, as given.  Dr Sallyanne Kuster recommends that you schedule a follow-up appointment in 1 year. You will receive a reminder letter in the mail two months in advance. If you don't receive a letter, please call our office to schedule the follow-up appointment.  If you need a refill on your cardiac medications before your next appointment, please call your pharmacy.

## 2016-08-07 DIAGNOSIS — D696 Thrombocytopenia, unspecified: Secondary | ICD-10-CM | POA: Diagnosis not present

## 2016-08-07 DIAGNOSIS — N182 Chronic kidney disease, stage 2 (mild): Secondary | ICD-10-CM | POA: Diagnosis not present

## 2016-08-07 DIAGNOSIS — I129 Hypertensive chronic kidney disease with stage 1 through stage 4 chronic kidney disease, or unspecified chronic kidney disease: Secondary | ICD-10-CM | POA: Diagnosis not present

## 2016-08-07 DIAGNOSIS — E1122 Type 2 diabetes mellitus with diabetic chronic kidney disease: Secondary | ICD-10-CM | POA: Diagnosis not present

## 2016-08-16 ENCOUNTER — Telehealth (HOSPITAL_COMMUNITY): Payer: Self-pay

## 2016-08-16 NOTE — Telephone Encounter (Signed)
Encounter complete. 

## 2016-08-20 ENCOUNTER — Ambulatory Visit (HOSPITAL_COMMUNITY)
Admission: RE | Admit: 2016-08-20 | Discharge: 2016-08-20 | Disposition: A | Discharge status: Home / Self Care | Payer: Medicare Other | Source: Ambulatory Visit | Attending: Cardiovascular Disease | Admitting: Cardiovascular Disease

## 2016-08-20 DIAGNOSIS — I2510 Atherosclerotic heart disease of native coronary artery without angina pectoris: Secondary | ICD-10-CM | POA: Diagnosis not present

## 2016-08-20 LAB — EXERCISE TOLERANCE TEST
Estimated workload: 17.1 METS
Exercise duration (min): 14 min
Exercise duration (sec): 7 s
MPHR: 150 {beats}/min
Peak HR: 150 {beats}/min
Percent HR: 100 %
RPE: 17
Rest HR: 55 {beats}/min

## 2016-12-03 DIAGNOSIS — Z85820 Personal history of malignant melanoma of skin: Secondary | ICD-10-CM | POA: Diagnosis not present

## 2016-12-03 DIAGNOSIS — L718 Other rosacea: Secondary | ICD-10-CM | POA: Diagnosis not present

## 2016-12-03 DIAGNOSIS — D0359 Melanoma in situ of other part of trunk: Secondary | ICD-10-CM | POA: Diagnosis not present

## 2016-12-03 DIAGNOSIS — L570 Actinic keratosis: Secondary | ICD-10-CM | POA: Diagnosis not present

## 2016-12-03 DIAGNOSIS — L821 Other seborrheic keratosis: Secondary | ICD-10-CM | POA: Diagnosis not present

## 2016-12-03 DIAGNOSIS — D485 Neoplasm of uncertain behavior of skin: Secondary | ICD-10-CM | POA: Diagnosis not present

## 2016-12-03 DIAGNOSIS — Z85828 Personal history of other malignant neoplasm of skin: Secondary | ICD-10-CM | POA: Diagnosis not present

## 2016-12-03 DIAGNOSIS — D1801 Hemangioma of skin and subcutaneous tissue: Secondary | ICD-10-CM | POA: Diagnosis not present

## 2016-12-16 DIAGNOSIS — D0359 Melanoma in situ of other part of trunk: Secondary | ICD-10-CM | POA: Diagnosis not present

## 2016-12-16 DIAGNOSIS — Z85828 Personal history of other malignant neoplasm of skin: Secondary | ICD-10-CM | POA: Diagnosis not present

## 2017-01-16 DIAGNOSIS — N400 Enlarged prostate without lower urinary tract symptoms: Secondary | ICD-10-CM | POA: Diagnosis not present

## 2017-01-16 DIAGNOSIS — N5201 Erectile dysfunction due to arterial insufficiency: Secondary | ICD-10-CM | POA: Diagnosis not present

## 2017-01-16 DIAGNOSIS — C672 Malignant neoplasm of lateral wall of bladder: Secondary | ICD-10-CM | POA: Diagnosis not present

## 2017-01-26 ENCOUNTER — Inpatient Hospital Stay (HOSPITAL_COMMUNITY)
Admission: EM | Admit: 2017-01-26 | Discharge: 2017-01-28 | DRG: 065 | Disposition: A | Discharge status: Home / Self Care | Payer: Medicare Other | Attending: Internal Medicine | Admitting: Internal Medicine

## 2017-01-26 ENCOUNTER — Encounter (HOSPITAL_COMMUNITY): Payer: Self-pay | Admitting: Emergency Medicine

## 2017-01-26 ENCOUNTER — Emergency Department (HOSPITAL_COMMUNITY): Payer: Medicare Other

## 2017-01-26 DIAGNOSIS — Z951 Presence of aortocoronary bypass graft: Secondary | ICD-10-CM

## 2017-01-26 DIAGNOSIS — R001 Bradycardia, unspecified: Secondary | ICD-10-CM | POA: Diagnosis present

## 2017-01-26 DIAGNOSIS — Z86718 Personal history of other venous thrombosis and embolism: Secondary | ICD-10-CM

## 2017-01-26 DIAGNOSIS — Q211 Atrial septal defect: Secondary | ICD-10-CM

## 2017-01-26 DIAGNOSIS — Z7982 Long term (current) use of aspirin: Secondary | ICD-10-CM | POA: Diagnosis not present

## 2017-01-26 DIAGNOSIS — I82441 Acute embolism and thrombosis of right tibial vein: Secondary | ICD-10-CM | POA: Diagnosis present

## 2017-01-26 DIAGNOSIS — I639 Cerebral infarction, unspecified: Principal | ICD-10-CM | POA: Diagnosis present

## 2017-01-26 DIAGNOSIS — I440 Atrioventricular block, first degree: Secondary | ICD-10-CM | POA: Diagnosis present

## 2017-01-26 DIAGNOSIS — R4701 Aphasia: Secondary | ICD-10-CM | POA: Diagnosis present

## 2017-01-26 DIAGNOSIS — N400 Enlarged prostate without lower urinary tract symptoms: Secondary | ICD-10-CM | POA: Diagnosis present

## 2017-01-26 DIAGNOSIS — R29701 NIHSS score 1: Secondary | ICD-10-CM | POA: Diagnosis present

## 2017-01-26 DIAGNOSIS — I82411 Acute embolism and thrombosis of right femoral vein: Secondary | ICD-10-CM | POA: Diagnosis present

## 2017-01-26 DIAGNOSIS — Z79899 Other long term (current) drug therapy: Secondary | ICD-10-CM | POA: Diagnosis not present

## 2017-01-26 DIAGNOSIS — Z833 Family history of diabetes mellitus: Secondary | ICD-10-CM | POA: Diagnosis not present

## 2017-01-26 DIAGNOSIS — I350 Nonrheumatic aortic (valve) stenosis: Secondary | ICD-10-CM | POA: Diagnosis not present

## 2017-01-26 DIAGNOSIS — R269 Unspecified abnormalities of gait and mobility: Secondary | ICD-10-CM | POA: Diagnosis present

## 2017-01-26 DIAGNOSIS — R339 Retention of urine, unspecified: Secondary | ICD-10-CM | POA: Diagnosis present

## 2017-01-26 DIAGNOSIS — E781 Pure hyperglyceridemia: Secondary | ICD-10-CM

## 2017-01-26 DIAGNOSIS — R4781 Slurred speech: Secondary | ICD-10-CM | POA: Diagnosis not present

## 2017-01-26 DIAGNOSIS — I2510 Atherosclerotic heart disease of native coronary artery without angina pectoris: Secondary | ICD-10-CM | POA: Diagnosis present

## 2017-01-26 DIAGNOSIS — Z8249 Family history of ischemic heart disease and other diseases of the circulatory system: Secondary | ICD-10-CM

## 2017-01-26 DIAGNOSIS — I82491 Acute embolism and thrombosis of other specified deep vein of right lower extremity: Secondary | ICD-10-CM | POA: Diagnosis present

## 2017-01-26 DIAGNOSIS — R42 Dizziness and giddiness: Secondary | ICD-10-CM | POA: Diagnosis present

## 2017-01-26 DIAGNOSIS — E1151 Type 2 diabetes mellitus with diabetic peripheral angiopathy without gangrene: Secondary | ICD-10-CM | POA: Diagnosis present

## 2017-01-26 DIAGNOSIS — Q2112 Patent foramen ovale: Secondary | ICD-10-CM

## 2017-01-26 DIAGNOSIS — E785 Hyperlipidemia, unspecified: Secondary | ICD-10-CM | POA: Diagnosis not present

## 2017-01-26 DIAGNOSIS — I6302 Cerebral infarction due to thrombosis of basilar artery: Secondary | ICD-10-CM | POA: Diagnosis not present

## 2017-01-26 DIAGNOSIS — I10 Essential (primary) hypertension: Secondary | ICD-10-CM | POA: Diagnosis present

## 2017-01-26 DIAGNOSIS — I82431 Acute embolism and thrombosis of right popliteal vein: Secondary | ICD-10-CM | POA: Diagnosis present

## 2017-01-26 LAB — PROTIME-INR
INR: 0.98
Prothrombin Time: 13 seconds (ref 11.4–15.2)

## 2017-01-26 LAB — COMPREHENSIVE METABOLIC PANEL
ALT: 32 U/L (ref 17–63)
AST: 28 U/L (ref 15–41)
Albumin: 4.4 g/dL (ref 3.5–5.0)
Alkaline Phosphatase: 78 U/L (ref 38–126)
Anion gap: 6 (ref 5–15)
BUN: 19 mg/dL (ref 6–20)
CO2: 25 mmol/L (ref 22–32)
Calcium: 9.3 mg/dL (ref 8.9–10.3)
Chloride: 109 mmol/L (ref 101–111)
Creatinine, Ser: 0.9 mg/dL (ref 0.61–1.24)
GFR calc Af Amer: >60 mL/min (ref >60)
GFR calc non Af Amer: >60 mL/min (ref >60)
Glucose, Bld: 111 mg/dL — ABNORMAL HIGH (ref 65–99)
Potassium: 4.1 mmol/L (ref 3.5–5.1)
Sodium: 140 mmol/L (ref 135–145)
Total Bilirubin: 0.3 mg/dL (ref 0.3–1.2)
Total Protein: 7.8 g/dL (ref 6.5–8.1)

## 2017-01-26 LAB — CBC
HCT: 44.8 % (ref 39.0–52.0)
Hemoglobin: 15.8 g/dL (ref 13.0–17.0)
MCH: 29.5 pg (ref 26.0–34.0)
MCHC: 35.3 g/dL (ref 30.0–36.0)
MCV: 83.6 fL (ref 78.0–100.0)
Platelets: 136 10*3/uL — ABNORMAL LOW (ref 150–400)
RBC: 5.36 MIL/uL (ref 4.22–5.81)
RDW: 13.3 % (ref 11.5–15.5)
WBC: 6.5 10*3/uL (ref 4.0–10.5)

## 2017-01-26 LAB — I-STAT CHEM 8, ED
BUN: 18 mg/dL (ref 6–20)
Calcium, Ion: 1.23 mmol/L (ref 1.15–1.40)
Chloride: 105 mmol/L (ref 101–111)
Creatinine, Ser: 0.8 mg/dL (ref 0.61–1.24)
Glucose, Bld: 104 mg/dL — ABNORMAL HIGH (ref 65–99)
HCT: 46 % (ref 39.0–52.0)
Hemoglobin: 15.6 g/dL (ref 13.0–17.0)
Potassium: 4.1 mmol/L (ref 3.5–5.1)
Sodium: 142 mmol/L (ref 135–145)
TCO2: 26 mmol/L (ref 0–100)

## 2017-01-26 LAB — DIFFERENTIAL
Basophils Absolute: 0 10*3/uL (ref 0.0–0.1)
Basophils Relative: 0 %
Eosinophils Absolute: 0.1 10*3/uL (ref 0.0–0.7)
Eosinophils Relative: 2 %
Lymphocytes Relative: 29 %
Lymphs Abs: 1.9 10*3/uL (ref 0.7–4.0)
Monocytes Absolute: 0.5 10*3/uL (ref 0.1–1.0)
Monocytes Relative: 7 %
Neutro Abs: 4.1 10*3/uL (ref 1.7–7.7)
Neutrophils Relative %: 62 %

## 2017-01-26 LAB — I-STAT TROPONIN, ED: Troponin i, poc: 0 ng/mL (ref 0.00–0.08)

## 2017-01-26 LAB — APTT: aPTT: 31 seconds (ref 24–36)

## 2017-01-26 MED ORDER — ASPIRIN EC 81 MG PO TBEC
81.0000 mg | DELAYED_RELEASE_TABLET | Freq: Every day | ORAL | Status: DC
  Administered 2017-01-26 – 2017-01-28 (×3): 81 mg via ORAL
  Filled 2017-01-26 (×3): qty 1

## 2017-01-26 MED ORDER — SENNOSIDES-DOCUSATE SODIUM 8.6-50 MG PO TABS: 1.0000 | ORAL_TABLET | Freq: Every evening | ORAL | Status: DC | PRN

## 2017-01-26 MED ORDER — ACETAMINOPHEN 650 MG RE SUPP: 650.0000 mg | RECTAL | Status: DC | PRN

## 2017-01-26 MED ORDER — STROKE: EARLY STAGES OF RECOVERY BOOK
Freq: Once | Status: AC
  Administered 2017-01-26
  Filled 2017-01-26: qty 1

## 2017-01-26 MED ORDER — ROSUVASTATIN CALCIUM 20 MG PO TABS
20.0000 mg | ORAL_TABLET | Freq: Every day | ORAL | Status: DC
  Filled 2017-01-26: qty 1

## 2017-01-26 MED ORDER — ATORVASTATIN CALCIUM 40 MG PO TABS
40.0000 mg | ORAL_TABLET | Freq: Every day | ORAL | Status: DC
  Administered 2017-01-26: 40 mg via ORAL
  Filled 2017-01-26: qty 1

## 2017-01-26 MED ORDER — ONDANSETRON HCL 4 MG/2ML IJ SOLN: 4.0000 mg | Freq: Three times a day (TID) | INTRAMUSCULAR | Status: DC | PRN

## 2017-01-26 MED ORDER — SODIUM CHLORIDE 0.9 % IV SOLN
INTRAVENOUS | Status: DC
  Administered 2017-01-26: via INTRAVENOUS

## 2017-01-26 MED ORDER — HYDRALAZINE HCL 20 MG/ML IJ SOLN: 5.0000 mg | INTRAMUSCULAR | Status: DC | PRN

## 2017-01-26 MED ORDER — ENOXAPARIN SODIUM 40 MG/0.4ML ~~LOC~~ SOLN
40.0000 mg | SUBCUTANEOUS | Status: DC
  Administered 2017-01-27: 40 mg via SUBCUTANEOUS
  Filled 2017-01-26 (×2): qty 0.4

## 2017-01-26 MED ORDER — ZOLPIDEM TARTRATE 5 MG PO TABS: 5.0000 mg | ORAL_TABLET | Freq: Every evening | ORAL | Status: DC | PRN

## 2017-01-26 MED ORDER — ACETAMINOPHEN 160 MG/5ML PO SOLN: 650.0000 mg | ORAL | Status: DC | PRN

## 2017-01-26 MED ORDER — ACETAMINOPHEN 325 MG PO TABS: 650.0000 mg | ORAL_TABLET | ORAL | Status: DC | PRN

## 2017-01-26 MED ORDER — METOPROLOL SUCCINATE ER 25 MG PO TB24
50.0000 mg | ORAL_TABLET | Freq: Every day | ORAL | Status: DC
  Filled 2017-01-26: qty 1

## 2017-01-26 MED ORDER — CLOPIDOGREL BISULFATE 75 MG PO TABS
75.0000 mg | ORAL_TABLET | Freq: Every day | ORAL | Status: DC
  Administered 2017-01-26 – 2017-01-27 (×2): 75 mg via ORAL
  Filled 2017-01-26 (×2): qty 1

## 2017-01-26 NOTE — ED Notes (Signed)
Pt ambulated to bathroom with steady gait. 

## 2017-01-26 NOTE — ED Notes (Signed)
EDP cleared patient to have something to eat. Pt has passed stroke swallow screen at [facility].

## 2017-01-26 NOTE — ED Notes (Signed)
Patient transported to MRI 

## 2017-01-26 NOTE — ED Notes (Signed)
Patient transported to CT 

## 2017-01-26 NOTE — ED Provider Notes (Addendum)
[facility] DEPT Provider Note   CSN: [id number] Arrival date & time: 01/26/17  1445     History   Chief Complaint Chief Complaint  Patient presents with  . Gait Problem  . Aphasia  . Dizziness    HPI Jacob Hensley is a 71 y.o. male. CC: dizzy and slurred speech.  HPI:  Pt with co plaint of awakening this am at 8:00. He noticed feeling "swimmy headed", and occasional "thick tongue" and slurred speech.  LTKW was last pm at 22:00 pm.   Patient returned from a trip yesterday morning. He states that he slept until about noon after returning from his trip. He got up and then was fine during the day. He went out to dinner with family. He went to bed about 10. Upon waking this morning he states that he did his toiletries with using the restroom and brushing his teeth and hair. He noticed that he felt a little "swimmy headed" when walking around".  His daughter has noticed that he has an occasional slurred speech. He states at times he will feel "thick tongue". No drooling. No obvious facial droop. No vision changes. Does not notice any new lateral weakness or numbness. He is ambulatory but he states he has to hold onto something to help balance himself. No headache.  Past Medical History:  Diagnosis Date  . BPH (benign prostatic hyperplasia)   . Coronary artery disease    CABG 2003-DR. CROITORU IS PT'S CARDIOLOGIST - LAST OFFICE VISIT 08/02/11-PT EXERCISES DAILY-NO C/O OF CHEST PAIN  . Diabetes mellitus    BORDERLINE - PT CHECKS HIS BLOOD SUGARS AT HOME - NO MEDS  . DVT of leg (deep venous thrombosis) ([place]) 2003   S/P CABG SURGERY  . Hyperlipidemia   . Hypertension   . Urine retention    PT STATES HE HAS BEEN DOING I&O CATHS FOR PAST 3 YRS    Patient Active Problem List   Diagnosis Date Noted  . Drug-induced bradycardia 08/01/2014  . CAD s/p CABG 08/06/2013  . Essential hypertension 08/06/2013  . Hyperlipidemia 08/06/2013    Past Surgical History:  Procedure Laterality  Date  . CORONARY ARTERY BYPASS GRAFT  02/05/2002   LIMA to LAD,RIMA to PDA,SVG to diagonal & free left radial arter to the oblique marginal artery - Dr. Cyndia Bent  . CPET w/PFT  1//20/2014   Normal  . CYSTOSCOPY WITH BIOPSY  11/08/2011   Procedure: CYSTOSCOPY WITH BIOPSY;  Surgeon: Fredricka Bonine, MD;  Location: [facility];  Service: Urology;;  Bladder biopsies  . CYSTOSCOPY WITH BIOPSY  06/19/2012   Procedure: CYSTOSCOPY WITH BIOPSY;  Surgeon: Fredricka Bonine, MD;  Location: [facility];  Service: Urology;  Laterality: N/A;  fulgaration of bleeders  . myolema of back  2012  . TONSILLECTOMY    . TRANSURETHRAL RESECTION OF PROSTATE  11-08-2011  . US ECHOCARDIOGRAPHY  04/14/2009   EF >50%,borderline dilated RV w/normal systolic fx,mildly dilated LA,mild AI,MR,TR.       Home Medications    Prior to Admission medications   Medication Sig Start Date End Date Taking? Authorizing Provider  aspirin 81 MG tablet Take 1 tablet (81 mg total) by mouth at bedtime. 11/11/11  Yes Festus Aloe, MD  metoprolol (TOPROL-XL) 50 MG 24 hr tablet Take 50 mg by mouth daily before breakfast.    Yes [provider]  [place]   Yes [provider]  ramipril (ALTACE) 10 MG  capsule Take 10 mg by mouth daily after breakfast.    Yes [provider]  rosuvastatin (CRESTOR) 20 MG tablet Take 20 mg by mouth at bedtime.    Yes [provider]    Family History Family History  Problem Relation Age of Onset  . Cancer Mother        Breast  . Diabetes Father   . Heart failure Father     Social History Social History  Substance Use Topics  . Smoking status: Never Smoker  . Smokeless tobacco: Never Used  . Alcohol use No     Allergies   Patient has no known allergies.   Review of Systems Review of Systems  Constitutional: Negative for appetite change, chills, diaphoresis, fatigue and fever.  HENT:  Negative for mouth sores, sore throat and trouble swallowing.   Eyes: Negative for visual disturbance.  Respiratory: Negative for cough, chest tightness, shortness of breath and wheezing.   Cardiovascular: Negative for chest pain.  Gastrointestinal: Negative for abdominal distention, abdominal pain, diarrhea, nausea and vomiting.  Endocrine: Negative for polydipsia, polyphagia and polyuria.  Genitourinary: Negative for dysuria, frequency and hematuria.  Musculoskeletal: Negative for gait problem.  Skin: Negative for color change, pallor and rash.  Neurological: Positive for dizziness and speech difficulty. Negative for syncope, light-headedness and headaches.  Hematological: Does not bruise/bleed easily.  Psychiatric/Behavioral: Negative for behavioral problems and confusion.     Physical Exam Updated Vital Signs BP (!) 151/98 (BP Location: Left Arm)   Pulse (!) 58   Temp 98.4 F (36.9 C)   Resp 18   Ht 5' 9.5" (1.765 m)   Wt 79.8 kg (176 lb)   SpO2 94%   BMI 25.62 kg/m   Physical Exam  Constitutional: He is oriented to person, place, and time. He appears well-developed and well-nourished. No distress.  HENT:  Head: Normocephalic.  Eyes: Conjunctivae are normal. Pupils are equal, round, and reactive to light. No scleral icterus.  Neck: Normal range of motion. Neck supple. No thyromegaly present.  Cardiovascular: Normal rate and regular rhythm.  Exam reveals no gallop and no friction rub.   No murmur heard. Pulmonary/Chest: Effort normal and breath sounds normal. No respiratory distress. He has no wheezes. He has no rales.  Abdominal: Soft. Bowel sounds are normal. He exhibits no distension. There is no tenderness. There is no rebound.  Musculoskeletal: Normal range of motion.  Neurological: He is alert and oriented to person, place, and time.  No nystagmus. No visual field cuts. No upper or lower droop. Normal facial sensation. Normal palate rise and shoulder shrug. Describes  "swimmy headed " but no nystagmus. No focal weakness or numbness to extremities. No pronator or leg drift. Neg rhomberg.  Skin: Skin is warm and dry. No rash noted.  Psychiatric: He has a normal mood and affect. His behavior is normal.     ED Treatments / Results  Labs (all labs ordered are listed, but only abnormal results are displayed) Labs Reviewed  CBC - Abnormal; Notable for the following:       Result Value   Platelets 136 (*)    All other components within normal limits  DIFFERENTIAL  PROTIME-INR  APTT  COMPREHENSIVE METABOLIC PANEL  I-STAT TROPOININ, ED  I-STAT CHEM 8, ED  CBG MONITORING, ED    EKG  EKG Interpretation  Date/Time:  Sunday January 26 2017 14:58:29 EDT Ventricular Rate:  62 PR Interval:    QRS Duration: 98 QT Interval:  426 QTC Calculation:  433 R Axis:   15 Text Interpretation:  Sinus rhythm Borderline T wave abnormalities Confirmed by Tanna Furry [phone]) on 01/26/2017 3:02:41 PM       Radiology Ct Head Wo Contrast  Result Date: 01/26/2017 CLINICAL DATA:  71 year old male who awoke with dizziness and slurred speech today. Last known well 2300 hours yesterday. EXAM: CT HEAD WITHOUT CONTRAST TECHNIQUE: Contiguous axial images were obtained from the base of the skull through the vertex without intravenous contrast. COMPARISON:  None. FINDINGS: Brain: Cerebral volume is within normal limits for age. No midline shift, ventriculomegaly, mass effect, evidence of mass lesion, intracranial hemorrhage or evidence of cortically based acute infarction. Gray-white matter differentiation is within normal limits throughout the brain. No encephalomalacia identified. Vascular: Calcified atherosclerosis at the skull base. Dominant appearing distal right vertebral artery. No suspicious intracranial vascular hyperdensity. Skull: No osseous abnormality identified. Sinuses/Orbits: Clear. Other: Mild postoperative changes to the right globe. No acute orbit or scalp soft tissue  findings identified. IMPRESSION: Normal for age non contrast CT appearance of the brain. Electronically Signed   By: Genevie Ann M.D.   On: 01/26/2017 15:46    Procedures Procedures (including critical care time)  Medications Ordered in ED Medications - No data to display   Initial Impression / Assessment and Plan / ED Course  I have reviewed the triage vital signs and the nursing notes.  Pertinent labs & imaging results that were available during my care of the patient were reviewed by me and considered in my medical decision making (see chart for details).   Plan Ct. Likely MRI if no CVA noted on CT.Patient remains minimally symptomatic. Occasional slurred speech. I was able to stand him. He can stand in the bili. Did not ambulate him. I placed a call to, and discussed the case with Dr. Bobby Rumpf at [facility] ER. No MRI is available to me today at [facility]. I placed a call to neurology on-call today to vascular consultation upon arrival to [facility].  16:20:  Discussed with patient that CT normal., and discussed need for MRI. NO MIR at [facility] today. Will Transfer to [place]. Discussed with Dr. Rogene Houston at [facility] ER.   16:34: Discussed wth Dr. Orlena Sheldon or Neurology. Will see at [facility] ER in consultation.  Final Clinical Impressions(s) / ED Diagnoses   Final diagnoses:  Slurred speech    New Prescriptions New Prescriptions   No medications on file     Tanna Furry, MD 01/26/17 1622    Tanna Furry, MD 01/26/17 [phone]

## 2017-01-26 NOTE — Progress Notes (Signed)
MRI completed, revealing a small acute linear infarction in the right pons, basilar artery perforator branch territory. Also noted is a small chronic lacunar infarction in the medial right thalamus. Images and Radiology report personally reviewed.   A/R: 1. Add Plavix to ASA.  2. Obtain MRA of head, carotid ultrasound and TTE 3. Cardiac telemetry 4. Switch rosuvastatin to Lipitor 40 mg po qd 5. Permissive HTN x 24 hours, followed by gradual reduction with long term goal of 120/80. Recommendations may change if severe flow-limiting stenosis is seen on MRA head or carotid ultrasound.   Electronically signed: Dr. Kerney Elbe

## 2017-01-26 NOTE — ED Triage Notes (Signed)
pti reports waking up around 8a today with feeling little dizzy headed and gait little wobbly per wife.  Patient's wife states that patient speech is more slurred than normal. Patient was fine when went to be last night around 2300.  Patient reports feeling like first time putting on bi-focal glasses and trying to focus when you walk around.

## 2017-01-26 NOTE — ED Notes (Signed)
Consulting md at bedside

## 2017-01-26 NOTE — ED Notes (Signed)
ED Provider at bedside. 

## 2017-01-26 NOTE — Consult Note (Signed)
Reason for Consult: ?stroke Referring Physician: ER  JACQUEZ SHEETZ is an 71 y.o. male.  HPI: He came back from a out of state trip yesterday morning and felt fine other than normal fatigue.  He had an uneventful day and night.  This am he woke up with slurred speech and an imbalance feeling.  No vertigo.  No diplopia.  No blurred vision.  No weakness or numbness anywhere.  No language disturbances.  His wife feels that his speech is still slurred, but he is more vague about that.  When he walked to the bathroom earlier, he was unsteady.  CT Brain looks normal.  He does take ASA 81 mg qd and did not miss any doses.  He is on a beta blocker,  ACEI, and statin as well.  He has a history of CAD but recent exercise stress test was normal.    Past Medical History:  Diagnosis Date  . BPH (benign prostatic hyperplasia)   . Coronary artery disease    CABG 2003-DR. CROITORU IS PT'S CARDIOLOGIST - LAST OFFICE VISIT 08/02/11-PT EXERCISES DAILY-NO C/O OF CHEST PAIN  . Diabetes mellitus    BORDERLINE - PT CHECKS HIS BLOOD SUGARS AT HOME - NO MEDS  . DVT of leg (deep venous thrombosis) ([place]) 2003   S/P CABG SURGERY  . Hyperlipidemia   . Hypertension   . Urine retention    PT STATES HE HAS BEEN DOING I&O CATHS FOR PAST 3 YRS    Past Surgical History:  Procedure Laterality Date  . CORONARY ARTERY BYPASS GRAFT  02/05/2002   LIMA to LAD,RIMA to PDA,SVG to diagonal & free left radial arter to the oblique marginal artery - Dr. Cyndia Bent  . CPET w/PFT  1//20/2014   Normal  . CYSTOSCOPY WITH BIOPSY  11/08/2011   Procedure: CYSTOSCOPY WITH BIOPSY;  Surgeon: Fredricka Bonine, MD;  Location: [facility];  Service: Urology;;  Bladder biopsies  . CYSTOSCOPY WITH BIOPSY  06/19/2012   Procedure: CYSTOSCOPY WITH BIOPSY;  Surgeon: Fredricka Bonine, MD;  Location: [facility];  Service: Urology;  Laterality: N/A;  fulgaration of bleeders  . myolema of back  2012  . TONSILLECTOMY    . TRANSURETHRAL RESECTION OF  PROSTATE  11-08-2011  . US ECHOCARDIOGRAPHY  04/14/2009   EF >50%,borderline dilated RV w/normal systolic fx,mildly dilated LA,mild AI,MR,TR.    Family History  Problem Relation Age of Onset  . Cancer Mother        Breast  . Diabetes Father   . Heart failure Father     Social History:  reports that he has never smoked. He has never used smokeless tobacco. He reports that he does not drink alcohol or use drugs.  Allergies: No Known Allergies  Prior to Admission medications   Medication Sig Start Date End Date Taking? Authorizing Provider  aspirin 81 MG tablet Take 1 tablet (81 mg total) by mouth at bedtime. 11/11/11  Yes Festus Aloe, MD  metoprolol (TOPROL-XL) 50 MG 24 hr tablet Take 50 mg by mouth daily before breakfast.    Yes [provider]  [place]   Yes [provider]  ramipril (ALTACE) 10 MG capsule Take 10 mg by mouth daily after breakfast.    Yes [provider]  rosuvastatin (CRESTOR) 20 MG tablet Take 20 mg by mouth at bedtime.    Yes [provider]    Medications: Scheduled:  Results for orders placed  or performed during the hospital encounter of 01/26/17 (from the past 48 hour(s))  Protime-INR     Status: None   Collection Time: 01/26/17  3:59 PM  Result Value Ref Range   Prothrombin Time 13.0 11.4 - 15.2 seconds   INR 0.98   APTT     Status: None   Collection Time: 01/26/17  3:59 PM  Result Value Ref Range   aPTT 31 24 - 36 seconds  CBC     Status: Abnormal   Collection Time: 01/26/17  3:59 PM  Result Value Ref Range   WBC 6.5 4.0 - 10.5 K/uL   RBC 5.36 4.22 - 5.81 MIL/uL   Hemoglobin 15.8 13.0 - 17.0 g/dL   HCT 44.8 39.0 - 52.0 %   MCV 83.6 78.0 - 100.0 fL   MCH 29.5 26.0 - 34.0 pg   MCHC 35.3 30.0 - 36.0 g/dL   RDW 13.3 11.5 - 15.5 %   Platelets 136 (L) 150 - 400 K/uL  Differential     Status: None   Collection Time: 01/26/17  3:59 PM  Result Value  Ref Range   Neutrophils Relative % 62 %   Neutro Abs 4.1 1.7 - 7.7 K/uL   Lymphocytes Relative 29 %   Lymphs Abs 1.9 0.7 - 4.0 K/uL   Monocytes Relative 7 %   Monocytes Absolute 0.5 0.1 - 1.0 K/uL   Eosinophils Relative 2 %   Eosinophils Absolute 0.1 0.0 - 0.7 K/uL   Basophils Relative 0 %   Basophils Absolute 0.0 0.0 - 0.1 K/uL  Comprehensive metabolic panel     Status: Abnormal   Collection Time: 01/26/17  3:59 PM  Result Value Ref Range   Sodium 140 135 - 145 mmol/L   Potassium 4.1 3.5 - 5.1 mmol/L   Chloride 109 101 - 111 mmol/L   CO2 25 22 - 32 mmol/L   Glucose, Bld 111 (H) 65 - 99 mg/dL   BUN 19 6 - 20 mg/dL   Creatinine, Ser 0.90 0.61 - 1.24 mg/dL   Calcium 9.3 8.9 - 10.3 mg/dL   Total Protein 7.8 6.5 - 8.1 g/dL   Albumin 4.4 3.5 - 5.0 g/dL   AST 28 15 - 41 U/L   ALT 32 17 - 63 U/L   Alkaline Phosphatase 78 38 - 126 U/L   Total Bilirubin 0.3 0.3 - 1.2 mg/dL   GFR calc non Af Amer >60 >60 mL/min   GFR calc Af Amer >60 >60 mL/min    Comment: (NOTE) The eGFR has been calculated using the CKD EPI equation. This calculation has not been validated in all clinical situations. eGFR's persistently <60 mL/min signify possible Chronic Kidney Disease.    Anion gap 6 5 - 15  I-stat troponin, ED     Status: None   Collection Time: 01/26/17  4:21 PM  Result Value Ref Range   Troponin i, poc 0.00 0.00 - 0.08 ng/mL   Comment 3            Comment: Due to the release kinetics of cTnI, a negative result within the first hours of the onset of symptoms does not rule out myocardial infarction with certainty. If myocardial infarction is still suspected, repeat the test at appropriate intervals.   I-Stat Chem 8, ED     Status: Abnormal   Collection Time: 01/26/17  4:22 PM  Result Value Ref Range   Sodium 142 135 - 145 mmol/L   Potassium 4.1 3.5 - 5.1  mmol/L   Chloride 105 101 - 111 mmol/L   BUN 18 6 - 20 mg/dL   Creatinine, Ser 0.80 0.61 - 1.24 mg/dL   Glucose, Bld 104 (H) 65 -  99 mg/dL   Calcium, Ion 1.23 1.15 - 1.40 mmol/L   TCO2 26 0 - 100 mmol/L   Hemoglobin 15.6 13.0 - 17.0 g/dL   HCT 46.0 39.0 - 52.0 %    Ct Head Wo Contrast  Result Date: 01/26/2017 CLINICAL DATA:  71 year old male who awoke with dizziness and slurred speech today. Last known well 2300 hours yesterday. EXAM: CT HEAD WITHOUT CONTRAST TECHNIQUE: Contiguous axial images were obtained from the base of the skull through the vertex without intravenous contrast. COMPARISON:  None. FINDINGS: Brain: Cerebral volume is within normal limits for age. No midline shift, ventriculomegaly, mass effect, evidence of mass lesion, intracranial hemorrhage or evidence of cortically based acute infarction. Gray-white matter differentiation is within normal limits throughout the brain. No encephalomalacia identified. Vascular: Calcified atherosclerosis at the skull base. Dominant appearing distal right vertebral artery. No suspicious intracranial vascular hyperdensity. Skull: No osseous abnormality identified. Sinuses/Orbits: Clear. Other: Mild postoperative changes to the right globe. No acute orbit or scalp soft tissue findings identified. IMPRESSION: Normal for age non contrast CT appearance of the brain. Electronically Signed   By: Genevie Ann M.D.   On: 01/26/2017 15:46    ROS Blood pressure (!) 142/87, pulse (!) 53, temperature 98.4 F (36.9 C), resp. rate 13, height 5' 9.5" (1.765 m), weight 79.8 kg (176 lb), SpO2 94 %. Neurologic Examination:  Awake, alert, fully oriented. PERL, EOMI. Face symmetrical.  Tongue midline.   Speech- no noticeable dysarthria to me.  Fluent, comprehension, naming, repetition - normal.   Strength 5/5 BUE and BLE. No pronator drift.  Sensory intact in all areas. Finger to nose and heel to shin intact bilaterally.  RAM intact bilaterally.   No babinski.  No hoffman's. Gait- deferred.  Assessment/Plan:  Possible cerebellar related TIA.  No clear evidence of cerebellar dysfunction at  this time, but small infarcts may resolve quicker.  Recommend MRI Brain.  If indicates a new infarct, then will consider ASA failure and recommend switching to Plavix 75 mg qd.  Check FLP and adjust Crestor accordingly.  BP is relatively well controlled.  No DM or smoking risk factors.  Do CDUS and TTE only if MRI is positive for stroke.    Rogue Jury, MD 01/26/2017, 6:35 PM

## 2017-01-26 NOTE — H&P (Signed)
History and Physical    Jacob Hensley CBJ:[id number] DOB: January 11, 1946 DOA: 01/26/2017  Referring MD/NP/PA:   PCP: Thressa Sheller, MD   Patient coming from:  The patient is coming from home.  At baseline, pt is independent for most of ADL.  Chief Complaint: Dizziness, slurred speech, gait instability  HPI: Jacob Hensley is a 71 y.o. male with medical history significant of hypertension, hyperlipidemia, diet-controlled diabetes, remote DVT, CAD, CABG, BPH, who presents with dizziness, slurred speech and gait instability.  Pt states that he woke up around 8 AM today with feeling little dizzy. His gait is wobbly per his wife. Patient's wife states that patient speech is more slurred than normal. Patient was last known normal at 23:00 last night. Patient denies unilateral weakness, numbness or tingling to extremities. No vision change or hearing loss. Patient does not have chest pain, shortness breath, cough, fever, chills. No nausea, vomiting, diarrhea, abdominal pain, symptoms of UTI.  ED Course: pt was found to have WBC 6.5, INR 0.98, PTT 31, negative troponin, electrolytes renal function okay, temperature normal, tachycardia, O2 sat 92-94% on room air. CT head is negative for acute intracranial abnormalities. Patient isn admitted to telemetry bed as inpatient. Neurology was consulted.  MRI of brain showed: 1. Small acute linear infarct in the right pons, Basilar Artery perforator branch territory. No associated hemorrhage or mass effect. 2. Small chronic lacunar infarct in the medial right thalamus.  Review of Systems:   General: no fevers, chills, no changes in body weight, no fatigue HEENT: no blurry vision, hearing changes or sore throat Respiratory: no dyspnea, coughing, wheezing CV: no chest pain, no palpitations GI: no nausea, vomiting, abdominal pain, diarrhea, constipation GU: no dysuria, burning on urination, increased urinary frequency, hematuria  Ext: no leg  edema Neuro: no unilateral weakness, numbness, or tingling, no vision change or hearing loss. Has gait instability and dizziness has slurred speech Skin: no rash, no skin tear. MSK: No muscle spasm, no deformity, no limitation of range of movement in spin Heme: No easy bruising.  Travel history: No recent long distant travel.  Allergy: No Known Allergies  Past Medical History:  Diagnosis Date  . BPH (benign prostatic hyperplasia)   . Coronary artery disease    CABG 2003-DR. CROITORU IS PT'S CARDIOLOGIST - LAST OFFICE VISIT 08/02/11-PT EXERCISES DAILY-NO C/O OF CHEST PAIN  . Diabetes mellitus    BORDERLINE - PT CHECKS HIS BLOOD SUGARS AT HOME - NO MEDS  . DVT of leg (deep venous thrombosis) ([place]) 2003   S/P CABG SURGERY  . Hyperlipidemia   . Hypertension   . Urine retention    PT STATES HE HAS BEEN DOING I&O CATHS FOR PAST 3 YRS    Past Surgical History:  Procedure Laterality Date  . CORONARY ARTERY BYPASS GRAFT  02/05/2002   LIMA to LAD,RIMA to PDA,SVG to diagonal & free left radial arter to the oblique marginal artery - Dr. Cyndia Bent  . CPET w/PFT  1//20/2014   Normal  . CYSTOSCOPY WITH BIOPSY  11/08/2011   Procedure: CYSTOSCOPY WITH BIOPSY;  Surgeon: Fredricka Bonine, MD;  Location: [facility];  Service: Urology;;  Bladder biopsies  . CYSTOSCOPY WITH BIOPSY  06/19/2012   Procedure: CYSTOSCOPY WITH BIOPSY;  Surgeon: Fredricka Bonine, MD;  Location: [facility];  Service: Urology;  Laterality: N/A;  fulgaration of bleeders  . myolema of back  2012  . TONSILLECTOMY    . TRANSURETHRAL RESECTION OF PROSTATE  11-08-2011  . US ECHOCARDIOGRAPHY  04/14/2009   EF >50%,borderline dilated RV w/normal systolic fx,mildly dilated LA,mild AI,MR,TR.    Social History:  reports that he has never smoked. He has never used smokeless tobacco. He reports that he does not drink alcohol or use drugs.  Family History:  Family History  Problem Relation Age of Onset  . Cancer Mother        Breast   . Diabetes Father   . Heart failure Father      Prior to Admission medications   Medication Sig Start Date End Date Taking? Authorizing Provider  aspirin 81 MG tablet Take 1 tablet (81 mg total) by mouth at bedtime. 11/11/11  Yes Festus Aloe, MD  metoprolol (TOPROL-XL) 50 MG 24 hr tablet Take 50 mg by mouth daily before breakfast.    Yes [provider]  [place]   Yes [provider]  ramipril (ALTACE) 10 MG capsule Take 10 mg by mouth daily after breakfast.    Yes [provider]  rosuvastatin (CRESTOR) 20 MG tablet Take 20 mg by mouth at bedtime.    Yes [provider]    Physical Exam: Vitals:   01/26/17 2000 01/26/17 2003 01/26/17 2117 01/26/17 2130  BP: (!) 159/89  (!) 169/94 (!) 160/89  Pulse: (!) 55 (!) 56 (!) 55 (!) 54  Resp: 15 14 18    Temp:      TempSrc:      SpO2: 92% 94% 94% 94%  Weight:      Height:       General: Not in acute distress HEENT:       Eyes: PERRL, EOMI, no scleral icterus.       ENT: No discharge from the ears and nose, no pharynx injection, no tonsillar enlargement.        Neck: No JVD, no bruit, no mass felt. Heme: No neck lymph node enlargement. Cardiac: S1/S2, RRR, No murmurs, No gallops or rubs. Respiratory: No rales, wheezing, rhonchi or rubs. GI: Soft, nondistended, nontender, no rebound pain, no organomegaly, BS present. GU: No hematuria Ext: No pitting leg edema bilaterally. 2+DP/PT pulse bilaterally. Musculoskeletal: No joint deformities, No joint redness or warmth, no limitation of ROM in spin. Skin: No rashes.  Neuro: Alert, oriented X3, cranial nerves II-XII grossly intact, moves all extremities normally. Muscle strength 5/5 in all extremities, sensation to light touch intact. Brachial reflex 2+ bilaterally. Negative Babinski's sign.  Psych: Patient is not psychotic, no suicidal or hemocidal ideation.  Labs on Admission: I have  personally reviewed following labs and imaging studies  CBC:  Recent Labs Lab 01/26/17 1559 01/26/17 1622  WBC 6.5  --   NEUTROABS 4.1  --   HGB 15.8 15.6  HCT 44.8 46.0  MCV 83.6  --   PLT 136*  --    Basic Metabolic Panel:  Recent Labs Lab 01/26/17 1559 01/26/17 1622  NA 140 142  K 4.1 4.1  CL 109 105  CO2 25  --   GLUCOSE 111* 104*  BUN 19 18  CREATININE 0.90 0.80  CALCIUM 9.3  --    GFR: Estimated Creatinine Clearance: 87.4 mL/min (by C-G formula based on SCr of 0.8 mg/dL). Liver Function Tests:  Recent Labs Lab 01/26/17 1559  AST 28  ALT 32  ALKPHOS 78  BILITOT 0.3  PROT 7.8  ALBUMIN 4.4   No results for input(s): LIPASE, AMYLASE in the last 168 hours. No results for input(s): AMMONIA in  the last 168 hours. Coagulation Profile:  Recent Labs Lab 01/26/17 1559  INR 0.98   Cardiac Enzymes: No results for input(s): CKTOTAL, CKMB, CKMBINDEX, TROPONINI in the last 168 hours. BNP (last 3 results) No results for input(s): PROBNP in the last 8760 hours. HbA1C: No results for input(s): HGBA1C in the last 72 hours. CBG: No results for input(s): GLUCAP in the last 168 hours. Lipid Profile: No results for input(s): CHOL, HDL, LDLCALC, TRIG, CHOLHDL, LDLDIRECT in the last 72 hours. Thyroid Function Tests: No results for input(s): TSH, T4TOTAL, FREET4, T3FREE, THYROIDAB in the last 72 hours. Anemia Panel: No results for input(s): VITAMINB12, FOLATE, FERRITIN, TIBC, IRON, RETICCTPCT in the last 72 hours. Urine analysis: No results found for: COLORURINE, APPEARANCEUR, LABSPEC, PHURINE, GLUCOSEU, HGBUR, BILIRUBINUR, KETONESUR, PROTEINUR, UROBILINOGEN, NITRITE, LEUKOCYTESUR Sepsis Labs: @LABRCNTIP (procalcitonin:4,lacticidven:4) )No results found for this or any previous visit (from the past 240 hour(s)).   Radiological Exams on Admission: Ct Head Wo Contrast  Result Date: 01/26/2017 CLINICAL DATA:  71 year old male who awoke with dizziness and slurred  speech today. Last known well 2300 hours yesterday. EXAM: CT HEAD WITHOUT CONTRAST TECHNIQUE: Contiguous axial images were obtained from the base of the skull through the vertex without intravenous contrast. COMPARISON:  None. FINDINGS: Brain: Cerebral volume is within normal limits for age. No midline shift, ventriculomegaly, mass effect, evidence of mass lesion, intracranial hemorrhage or evidence of cortically based acute infarction. Gray-white matter differentiation is within normal limits throughout the brain. No encephalomalacia identified. Vascular: Calcified atherosclerosis at the skull base. Dominant appearing distal right vertebral artery. No suspicious intracranial vascular hyperdensity. Skull: No osseous abnormality identified. Sinuses/Orbits: Clear. Other: Mild postoperative changes to the right globe. No acute orbit or scalp soft tissue findings identified. IMPRESSION: Normal for age non contrast CT appearance of the brain. Electronically Signed   By: Genevie Ann M.D.   On: 01/26/2017 15:46   Mr Brain Wo Contrast (neuro Protocol)  Result Date: 01/26/2017 CLINICAL DATA:  71 year old male who awoke with dizziness and slurred speech today. Last known well 2300 hours yesterday. EXAM: MRI HEAD WITHOUT CONTRAST TECHNIQUE: Multiplanar, multiecho pulse sequences of the brain and surrounding structures were obtained without intravenous contrast. COMPARISON:  Head CT 1539 hours today. FINDINGS: Brain: There is a linear focus of restricted diffusion along the right inferior pons best seen on series 3, image 15. There is little to no associated T2 and FLAIR hyperintensity at this time. No associated hemorrhage or mass effect. No other restricted diffusion. However, there is a chronic lacunar infarct along the medial right thalamus (series 6, image 14). Elsewhere gray and white matter signal is largely normal for age. No cortical encephalomalacia or chronic cerebral blood products identified. No restricted diffusion  to suggest acute infarction. No midline shift, mass effect, evidence of mass lesion, ventriculomegaly, extra-axial collection or acute intracranial hemorrhage. Cervicomedullary junction and pituitary are within normal limits. Vascular: Major intracranial vascular flow voids are preserved, the distal right vertebral artery appears dominant. Skull and upper cervical spine: Negative. Normal bone marrow signal. Sinuses/Orbits: Normal orbits soft tissues. Visualized paranasal sinuses and mastoids are stable and well pneumatized. Other: Visible internal auditory structures appear normal. Negative scalp soft tissues. IMPRESSION: 1. Small acute linear infarct in the right pons, Basilar Artery perforator branch territory. No associated hemorrhage or mass effect. 2. Small chronic lacunar infarct in the medial right thalamus. Electronically Signed   By: Genevie Ann M.D.   On: 01/26/2017 20:55     EKG: Independently reviewed.  Sinus  rhythm, QTC 433, early R-wave progression, nonspecific T-wave change.  Assessment/Plan Principal Problem:   Stroke (cerebrum) (HCC) Active Problems:   CAD s/p CABG   Essential hypertension   Hyperlipidemia   BPH (benign prostatic hyperplasia)   Stroke (cerebrum) [facility]): as shown by MRI of brain. neurology, Dr. Orlena Sheldon and Cheral Marker were consulted, recommended stroke workup.  - will admit to tele bed  - Highly appreciate neurology's consultation, will follow-up recommendations as follows: 1. Add Plavix to ASA.  2. Obtain MRA of head, carotid ultrasound and TTE 3. Cardiac telemetry 4. Switch rosuvastatin to Lipitor 40 mg po qd 5. Permissive HTN x 24 hours, followed by gradual reduction with long term goal of 120/80. Recommendations may change if severe flow-limiting stenosis is seen on MRA head or carotid ultrasound. - check fasting lipid panel and HbA1c  - PT/OT consult - swallowing screen  CAD s/p CABG: no CP -on ASA, and plavix -lipitor  Essential hypertension: -hold  metoprolol and lisinopril for permissive hypertension -IV hydralazine prn for SBP>220 mmHG  HLD: -lipitor  BPH: stable. Asymptomatic. No taking meds now. - Observe. closely    DVT ppx: SQ Lovenox Code Status: Full code Family Communication:   Yes, patient's  wife  at bed side Disposition Plan:  Anticipate discharge back to previous home environment Consults called: neurology, Dr. Orlena Sheldon and Cheral Marker   Admission status: Inpatient/tele     Date of Service 01/26/2017    Ivor Costa [facility] Pager [phone]  If 7PM-7AM, please contact night-coverage [email or web address] Password [facility] 01/26/2017, 10:33 PM

## 2017-01-26 NOTE — ED Provider Notes (Signed)
Patient seen by the daytime on neuro hospitalist. Patient's MRI shows evidence of an acute stroke a small acute linear infarct in the right pons. Basilar artery perforating branch territory. Also there is evidence of a small chronic lacunar infarct in the medial right thalamus area. Patient will require admission. Patient is followed by [facility]. Patient will be by [facility] hospitalist. Also have reconsult. The neuro hospitalist regarding the acute stroke findings. Patient has remained stable.  Results for orders placed or performed during the hospital encounter of 01/26/17  Protime-INR  Result Value Ref Range   Prothrombin Time 13.0 11.4 - 15.2 seconds   INR 0.98   APTT  Result Value Ref Range   aPTT 31 24 - 36 seconds  CBC  Result Value Ref Range   WBC 6.5 4.0 - 10.5 K/uL   RBC 5.36 4.22 - 5.81 MIL/uL   Hemoglobin 15.8 13.0 - 17.0 g/dL   HCT 44.8 39.0 - 52.0 %   MCV 83.6 78.0 - 100.0 fL   MCH 29.5 26.0 - 34.0 pg   MCHC 35.3 30.0 - 36.0 g/dL   RDW 13.3 11.5 - 15.5 %   Platelets 136 (L) 150 - 400 K/uL  Differential  Result Value Ref Range   Neutrophils Relative % 62 %   Neutro Abs 4.1 1.7 - 7.7 K/uL   Lymphocytes Relative 29 %   Lymphs Abs 1.9 0.7 - 4.0 K/uL   Monocytes Relative 7 %   Monocytes Absolute 0.5 0.1 - 1.0 K/uL   Eosinophils Relative 2 %   Eosinophils Absolute 0.1 0.0 - 0.7 K/uL   Basophils Relative 0 %   Basophils Absolute 0.0 0.0 - 0.1 K/uL  Comprehensive metabolic panel  Result Value Ref Range   Sodium 140 135 - 145 mmol/L   Potassium 4.1 3.5 - 5.1 mmol/L   Chloride 109 101 - 111 mmol/L   CO2 25 22 - 32 mmol/L   Glucose, Bld 111 (H) 65 - 99 mg/dL   BUN 19 6 - 20 mg/dL   Creatinine, Ser 0.90 0.61 - 1.24 mg/dL   Calcium 9.3 8.9 - 10.3 mg/dL   Total Protein 7.8 6.5 - 8.1 g/dL   Albumin 4.4 3.5 - 5.0 g/dL   AST 28 15 - 41 U/L   ALT 32 17 - 63 U/L   Alkaline Phosphatase 78 38 - 126 U/L   Total Bilirubin 0.3 0.3 - 1.2 mg/dL   GFR calc non Af Amer >60  >60 mL/min   GFR calc Af Amer >60 >60 mL/min   Anion gap 6 5 - 15  I-stat troponin, ED  Result Value Ref Range   Troponin i, poc 0.00 0.00 - 0.08 ng/mL   Comment 3          I-Stat Chem 8, ED  Result Value Ref Range   Sodium 142 135 - 145 mmol/L   Potassium 4.1 3.5 - 5.1 mmol/L   Chloride 105 101 - 111 mmol/L   BUN 18 6 - 20 mg/dL   Creatinine, Ser 0.80 0.61 - 1.24 mg/dL   Glucose, Bld 104 (H) 65 - 99 mg/dL   Calcium, Ion 1.23 1.15 - 1.40 mmol/L   TCO2 26 0 - 100 mmol/L   Hemoglobin 15.6 13.0 - 17.0 g/dL   HCT 46.0 39.0 - 52.0 %   Ct Head Wo Contrast  Result Date: 01/26/2017 CLINICAL DATA:  71 year old male who awoke with dizziness and slurred speech today. Last known well 2300 hours yesterday. EXAM: CT  HEAD WITHOUT CONTRAST TECHNIQUE: Contiguous axial images were obtained from the base of the skull through the vertex without intravenous contrast. COMPARISON:  None. FINDINGS: Brain: Cerebral volume is within normal limits for age. No midline shift, ventriculomegaly, mass effect, evidence of mass lesion, intracranial hemorrhage or evidence of cortically based acute infarction. Gray-white matter differentiation is within normal limits throughout the brain. No encephalomalacia identified. Vascular: Calcified atherosclerosis at the skull base. Dominant appearing distal right vertebral artery. No suspicious intracranial vascular hyperdensity. Skull: No osseous abnormality identified. Sinuses/Orbits: Clear. Other: Mild postoperative changes to the right globe. No acute orbit or scalp soft tissue findings identified. IMPRESSION: Normal for age non contrast CT appearance of the brain. Electronically Signed   By: Genevie Ann M.D.   On: 01/26/2017 15:46   Mr Brain Wo Contrast (neuro Protocol)  Result Date: 01/26/2017 CLINICAL DATA:  71 year old male who awoke with dizziness and slurred speech today. Last known well 2300 hours yesterday. EXAM: MRI HEAD WITHOUT CONTRAST TECHNIQUE: Multiplanar, multiecho pulse  sequences of the brain and surrounding structures were obtained without intravenous contrast. COMPARISON:  Head CT 1539 hours today. FINDINGS: Brain: There is a linear focus of restricted diffusion along the right inferior pons best seen on series 3, image 15. There is little to no associated T2 and FLAIR hyperintensity at this time. No associated hemorrhage or mass effect. No other restricted diffusion. However, there is a chronic lacunar infarct along the medial right thalamus (series 6, image 14). Elsewhere gray and white matter signal is largely normal for age. No cortical encephalomalacia or chronic cerebral blood products identified. No restricted diffusion to suggest acute infarction. No midline shift, mass effect, evidence of mass lesion, ventriculomegaly, extra-axial collection or acute intracranial hemorrhage. Cervicomedullary junction and pituitary are within normal limits. Vascular: Major intracranial vascular flow voids are preserved, the distal right vertebral artery appears dominant. Skull and upper cervical spine: Negative. Normal bone marrow signal. Sinuses/Orbits: Normal orbits soft tissues. Visualized paranasal sinuses and mastoids are stable and well pneumatized. Other: Visible internal auditory structures appear normal. Negative scalp soft tissues. IMPRESSION: 1. Small acute linear infarct in the right pons, Basilar Artery perforator branch territory. No associated hemorrhage or mass effect. 2. Small chronic lacunar infarct in the medial right thalamus. Electronically Signed   By: Genevie Ann M.D.   On: 01/26/2017 20:55     Fredia Sorrow, MD 01/26/17 2152

## 2017-01-27 ENCOUNTER — Inpatient Hospital Stay (HOSPITAL_COMMUNITY): Payer: Medicare Other

## 2017-01-27 DIAGNOSIS — I6302 Cerebral infarction due to thrombosis of basilar artery: Secondary | ICD-10-CM

## 2017-01-27 DIAGNOSIS — I350 Nonrheumatic aortic (valve) stenosis: Secondary | ICD-10-CM

## 2017-01-27 DIAGNOSIS — I639 Cerebral infarction, unspecified: Secondary | ICD-10-CM

## 2017-01-27 LAB — GLUCOSE, CAPILLARY
Glucose-Capillary: 124 mg/dL — ABNORMAL HIGH (ref 65–99)
Glucose-Capillary: 92 mg/dL (ref 65–99)
Glucose-Capillary: 104 mg/dL — ABNORMAL HIGH (ref 65–99)

## 2017-01-27 LAB — LIPID PANEL
Cholesterol: 142 mg/dL (ref 0–200)
HDL: 39 mg/dL — ABNORMAL LOW (ref >40)
LDL Cholesterol: 72 mg/dL (ref 0–99)
Total CHOL/HDL Ratio: 3.6 RATIO
Triglycerides: 153 mg/dL — ABNORMAL HIGH (ref <150)
VLDL: 31 mg/dL (ref 0–40)

## 2017-01-27 LAB — ECHOCARDIOGRAM COMPLETE
Height: 69.5 in
Weight: 2816 oz

## 2017-01-27 MED ORDER — ROSUVASTATIN CALCIUM 20 MG PO TABS: 40.0000 mg | ORAL_TABLET | Freq: Every day | ORAL | Status: DC

## 2017-01-27 MED ORDER — ATORVASTATIN CALCIUM 40 MG PO TABS: 40.0000 mg | ORAL_TABLET | Freq: Every day | ORAL | Status: DC

## 2017-01-27 MED ORDER — CLOPIDOGREL BISULFATE 75 MG PO TABS: 75.0000 mg | ORAL_TABLET | Freq: Every day | ORAL | 1 refills | Status: DC

## 2017-01-27 MED ORDER — METOPROLOL SUCCINATE ER 25 MG PO TB24
25.0000 mg | ORAL_TABLET | Freq: Every day | ORAL | Status: DC
  Administered 2017-01-27 – 2017-01-28 (×2): 25 mg via ORAL
  Filled 2017-01-27 (×2): qty 1

## 2017-01-27 MED ORDER — ENOXAPARIN SODIUM 40 MG/0.4ML ~~LOC~~ SOLN: 40.0000 mg | Freq: Once | SUBCUTANEOUS | Status: DC

## 2017-01-27 MED ORDER — ROSUVASTATIN CALCIUM 40 MG PO TABS: 40.0000 mg | ORAL_TABLET | Freq: Every day | ORAL | 1 refills | Status: DC

## 2017-01-27 MED ORDER — ROSUVASTATIN CALCIUM 20 MG PO TABS: 20.0000 mg | ORAL_TABLET | Freq: Every day | ORAL | Status: DC

## 2017-01-27 NOTE — Progress Notes (Signed)
OT Cancellation Note  Patient Details Name: Jacob Hensley MRN: [id number] DOB: 1945-08-25   Cancelled Treatment:    Reason Eval/Treat Not Completed: Other (comment). PT on bedrest. Please update activity orders to inititate therapy. Thanks  [place], OT/L  [phone] 01/27/2017 01/27/2017, 8:13 AM

## 2017-01-27 NOTE — Progress Notes (Signed)
*  PRELIMINARY RESULTS* Vascular Ultrasound Transcranial Doppler with B[place] has been completed with Dr. Erlinda Hong. High intensity transient signals (HITS) were heard with Valsalva, suggestive of a small patent foramen ovale (PFO).  01/27/2017 3:57 PM Maudry Mayhew, BS, RVT, RDCS, RDMS

## 2017-01-27 NOTE — Progress Notes (Signed)
  Echocardiogram 2D Echocardiogram has been performed.  Jacob Hensley 01/27/2017, 11:37 AM

## 2017-01-27 NOTE — Progress Notes (Addendum)
*  Preliminary Results* Bilateral lower extremity venous duplex completed. Right lower extremity is positive for subacute versus age indeterminate deep vein thrombosis involving the distal right femoral vein, right popliteal vein, right posterior tibial veins, right peroneal veins, and right gastrocnemius veins. There is no evidence of left lower extremity deep vein thrombosis or Baker's cyst bilaterally.    01/27/2017 3:01 PM Maudry Mayhew, BS, RVT, RDCS, RDMS

## 2017-01-27 NOTE — Progress Notes (Signed)
*  PRELIMINARY RESULTS* Vascular Ultrasound Carotid Duplex (Doppler) has been completed.  Preliminary findings: Bilateral 1-39% ICA stenosis, antegrade vertebral flow.   Everrett Coombe 01/27/2017, 9:04 AM

## 2017-01-27 NOTE — Evaluation (Signed)
Speech Language Pathology Evaluation Patient Details Name: Jacob Hensley MRN: [id number] DOB: 02-17-1946 Today's Date: 01/27/2017 Time: 0973-5329 SLP Time Calculation (min) (ACUTE ONLY): 17 min  Problem List:  Patient Active Problem List   Diagnosis Date Noted  . Stroke (cerebrum) ([place]) 01/26/2017  . BPH (benign prostatic hyperplasia) 01/26/2017  . Drug-induced bradycardia 08/01/2014  . CAD s/p CABG 08/06/2013  . Essential hypertension 08/06/2013  . Hyperlipidemia 08/06/2013   Past Medical History:  Past Medical History:  Diagnosis Date  . BPH (benign prostatic hyperplasia)   . Coronary artery disease    CABG 2003-DR. CROITORU IS PT'S CARDIOLOGIST - LAST OFFICE VISIT 08/02/11-PT EXERCISES DAILY-NO C/O OF CHEST PAIN  . Diabetes mellitus    BORDERLINE - PT CHECKS HIS BLOOD SUGARS AT HOME - NO MEDS  . DVT of leg (deep venous thrombosis) ([place]) 2003   S/P CABG SURGERY  . Hyperlipidemia   . Hypertension   . Urine retention    PT STATES HE HAS BEEN DOING I&O CATHS FOR PAST 3 YRS   Past Surgical History:  Past Surgical History:  Procedure Laterality Date  . CORONARY ARTERY BYPASS GRAFT  02/05/2002   LIMA to LAD,RIMA to PDA,SVG to diagonal & free left radial arter to the oblique marginal artery - Dr. Cyndia Bent  . CPET w/PFT  1//20/2014   Normal  . CYSTOSCOPY WITH BIOPSY  11/08/2011   Procedure: CYSTOSCOPY WITH BIOPSY;  Surgeon: Fredricka Bonine, MD;  Location: [facility];  Service: Urology;;  Bladder biopsies  . CYSTOSCOPY WITH BIOPSY  06/19/2012   Procedure: CYSTOSCOPY WITH BIOPSY;  Surgeon: Fredricka Bonine, MD;  Location: [facility];  Service: Urology;  Laterality: N/A;  fulgaration of bleeders  . myolema of back  2012  . TONSILLECTOMY    . TRANSURETHRAL RESECTION OF PROSTATE  11-08-2011  . US ECHOCARDIOGRAPHY  04/14/2009   EF >50%,borderline dilated RV w/normal systolic fx,mildly dilated LA,mild AI,MR,TR.   HPI:  Pt is a 71 y.o. male with PMH of hypertension,  hyperlipidemia, diet-controlled diabetes, remote DVT, CAD, CABG, BPH, who presented to ED 7/1 with dizziness, slurred speech and gait instability. MRI showed Small acute linear infarct in the right pons, Basilar Artery perforator branch territory. Cognitive linguistic eval ordered as part of stroke workup.    Assessment / Plan / Recommendation Clinical Impression  Pt currently demonstrating a mild dysarthria. Speech intelligibility mildly reduced; judged to be at 95% in conversation. At baseline, pt reportedly speaks softly; this in combination with decreased articulatory precision negatively impacts intelligibility. No cognitive/ language difficulties noted. If dysarthria persists, pt would benefit from outpatient speech therapy to increase communication success. Reviewed general compensatory strategies with pt and wife- speak louder, over-articulate, slow rate. SLP will sign off for acute needs; please re-consult if needs arise.    SLP Assessment  SLP Recommendation/Assessment: All further Speech Lanaguage Pathology  needs can be addressed in the next venue of care SLP Visit Diagnosis: Dysarthria and anarthria (R47.1)    Follow Up Recommendations  Outpatient SLP    Frequency and Duration           SLP Evaluation Cognition  Overall Cognitive Status: Within Functional Limits for tasks assessed Arousal/Alertness: Awake/alert Orientation Level: Oriented X4 Attention: Selective Selective Attention: Appears intact Memory: Appears intact Awareness: Appears intact Safety/Judgment: Appears intact       Comprehension  Auditory Comprehension Overall Auditory Comprehension: Appears within functional limits for tasks assessed Yes/No Questions: Within Functional Limits Commands: Within Functional Limits Conversation: Complex Reading Comprehension  Reading Status: Within funtional limits    Expression Expression Primary Mode of Expression: Verbal Verbal Expression Overall Verbal  Expression: Appears within functional limits for tasks assessed Initiation: No impairment Level of Generative/Spontaneous Verbalization: Conversation Naming: No impairment Pragmatics: No impairment Non-Verbal Means of Communication: Not applicable Written Expression Dominant Hand: Right   Oral / Motor  Oral Motor/Sensory Function Overall Oral Motor/Sensory Function: Within functional limits Motor Speech Overall Motor Speech: Impaired Respiration: Within functional limits Phonation: Low vocal intensity (reports this is baseline) Resonance: Within functional limits Articulation: Impaired Level of Impairment: Conversation Intelligibility: Intelligibility reduced Conversation: 75-100% accurate Motor Planning: Witnin functional limits Motor Speech Errors: Not applicable   GO                    Amy Dionicia Abler, [place], CCC-SLP 01/27/2017, 2:19 PM [phone]

## 2017-01-27 NOTE — Progress Notes (Signed)
Occupational Therapy Evaluation Patient Details Name: Jacob Hensley MRN: [id number] DOB: 1946-06-04 Today's Date: 01/27/2017    History of Present Illness 71 y.o. male with medical history significant of hypertension, hyperlipidemia, diet-controlled diabetes, remote DVT, CAD, CABG, BPH, who presents with dizziness, slurred speech and gait instability. MRI confirmed acute linear infarct in the right pons, Basilar Artery perforator branch territory.   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 Pt very active PTA. Educated pton sings and symptoms of CVA using "BeFst". Pt verbalized understanding. Pt's diplopia appears to have resolved. Pt does not appear to be at baseline regarding his speech or balance. At this time, recommend follow up with OT at the neuro outpt center to facilitate safe return to work and PLOF.     Follow Up Recommendations  Outpatient OT (neuro outpt)    Equipment Recommendations  None recommended by OT    Recommendations for Other Services       Precautions / Restrictions Precautions Precautions: Fall      Mobility Bed Mobility Overal bed mobility: Modified Independent                Transfers Overall transfer level: Needs assistance   Transfers: Sit to/from Stand Sit to Stand: Supervision         General transfer comment: Initially minimally unsteady. IMproved as pt increased mobility.     Balance Overall balance assessment: Needs assistance   Sitting balance-Leahy Scale: Good       Standing balance-Leahy Scale: Fair      Pt states "it's like being on a boat"                       ADL either performed or assessed with clinical judgement   ADL Overall ADL's : Needs assistance/impaired     Grooming: Modified independent;Standing   Upper Body Bathing: Modified independent;Sitting   Lower Body Bathing: Supervison/ safety;Sit to/from stand   Upper Body Dressing  : Modified independent   Lower Body Dressing: Supervision/safety;Sit to/from stand   Toilet Transfer: Designer, fashion/clothing and Hygiene: Supervision/safety       Functional mobility during ADLs: Min guard General ADL Comments: Educated pt/family on recommedation on use of shower chair for bathing     Vision Baseline Vision/History: Wears glasses Wears Glasses: At all times Vision Assessment?: Yes Eye Alignment: Within Functional Limits Ocular Range of Motion: Within Functional Limits Alignment/Gaze Preference: Within Defined Limits Tracking/Visual Pursuits: Able to track stimulus in all quads without difficulty Saccades: Within functional limits Convergence: Within functional limits Visual Fields: No apparent deficits Diplopia Assessment: Other (comment) (was having horizontal diplopia with distance. Has resolved)     Perception     Praxis Praxis Praxis tested?: Within functional limits    Pertinent Vitals/Pain Pain Assessment: No/denies pain     Hand Dominance Right   Extremity/Trunk Assessment Upper Extremity Assessment Upper Extremity Assessment: Overall WFL for tasks assessed   Lower Extremity Assessment Lower Extremity Assessment: Defer to PT evaluation   Cervical / Trunk Assessment Cervical / Trunk Assessment: Normal   Communication Communication Communication: Expressive difficulties   Cognition Arousal/Alertness: Awake/alert Behavior During Therapy: WFL for tasks assessed/performed Overall Cognitive Status: Within Functional Limits for tasks assessed  General Comments       Exercises     Shoulder Instructions      Home Living Family/patient expects to be discharged to:: Private residence Living Arrangements: Spouse/significant other Available Help at Discharge: Family;Available 24 hours/day Type of Home: House Home Access: Stairs to enter [employer] of  Steps: 6-8 Entrance Stairs-Rails: Right Home Layout: Two level;Able to live on main level with bedroom/bathroom     Bathroom Shower/Tub: Occupational psychologist: Standard Bathroom Accessibility: Yes How Accessible: Accessible via walker Home Equipment: Crutches          Prior Functioning/Environment Level of Independence: Independent        Comments: Driving for [employer] -         OT Problem List: Impaired vision/perception;Impaired balance (sitting and/or standing);Decreased safety awareness      OT Treatment/Interventions:      OT Goals(Current goals can be found in the care plan section) Acute Rehab OT Goals Patient Stated Goal: to return to current lifestyle OT Goal Formulation: All assessment and education complete, DC therapy (further OT at the neuro outpt center)  OT Frequency:     Barriers to D/C:            Co-evaluation              AM-PAC PT "6 Clicks" Daily Activity     Outcome Measure Help from another person eating meals?: None Help from another person taking care of personal grooming?: None Help from another person toileting, which includes using toliet, bedpan, or urinal?: None Help from another person bathing (including washing, rinsing, drying)?: None Help from another person to put on and taking off regular upper body clothing?: None Help from another person to put on and taking off regular lower body clothing?: None 6 Click Score: 24   End of Session Equipment Utilized During Treatment: Gait belt Nurse Communication: Mobility status  Activity Tolerance: Patient tolerated treatment well Patient left: in chair;with call bell/phone within reach;with family/visitor present  OT Visit Diagnosis: Unsteadiness on feet (R26.81)                Time: 1300-1330 OT Time Calculation (min): 30 min Charges:  OT General Charges $OT Visit: 1 Procedure OT Evaluation $OT Eval Low Complexity: 1 Procedure OT Treatments $Therapeutic  Activity: 8-22 mins G-Codes:     [facility], OT/L  [phone] 01/27/2017  Annete Ayuso,HILLARY 01/27/2017, 2:01 PM

## 2017-01-27 NOTE — Progress Notes (Addendum)
[facility] PROGRESS NOTE  DUELL HOLDREN WEX:[id number] DOB: 06/25/1946 DOA: 01/26/2017   PCP: Thressa Sheller, MD     Assessment/Plan: Principal Problem:   Stroke (cerebrum) [facility]) Active Problems:   CAD s/p CABG   Essential hypertension   Hyperlipidemia   BPH (benign prostatic hyperplasia)   71 y.o. male with medical history significant of hypertension, hyperlipidemia, diet-controlled diabetes, remote DVT, CAD, CABG, BPH, who presents with dizziness, slurred speech and gait instability. MRI confirmed acute linear infarct in the right pons, Basilar Artery perforator branch territory. No associated hemorrhage or mass effect  Assessment and plan Stroke (cerebrum) [facility]): as shown by MRI of Jacob. Neurology consulted, recommended stroke workup. Telemetry shows sinus rhythm, first-degree AV block MRA did not show any large vessel occlusion Carotid Doppler  Bilateral 1-39% Echo pending Continue statin PT/OT consult Venous doppler positive for DVT and TCD positive for small PFO Neurology recommends full dose anticoagulation with eliquis for DVT     CAD s/p CABG 2003: no CP -on ASA 81 mg ,   -lipitor  Essential hypertension: Continue  metoprolol   -IV hydralazine prn for SBP>220 mmHG  HLD: Continue rosuvastatin  BPH: stable. Asymptomatic. No taking meds now. - Observe. closely   DVT  Will start patient on eliquis, neurology recommends starting eliquis  5mg  PO BID  Tomorrow once decision is made  After talking to the wife  Will give one therapeutic dose of lovenox tonight      DVT prophylaxsis Lovenox  Code Status:  Full code   Family Communication: Discussed in detail with the patient, all imaging results, lab results explained to the patient   Disposition Plan: After completion of stroke workup      Consultants:  Neurology  Procedures:  None  Antibiotics: Anti-infectives    None         HPI/Subjective: Still has some  dysarthria and dizziness   Objective: Vitals:   01/27/17 0200 01/27/17 0400 01/27/17 0600 01/27/17 0800  BP: (!) 146/83 136/79 121/60 (!) 128/56  Pulse: (!) 54 (!) 53 (!) 53 (!) 56  Resp: 18 18 16 16   Temp: 98.9 F (37.2 C)   97.7 F (36.5 C)  TempSrc: Oral   Oral  SpO2: 94% 94% 95% 97%  Weight:      Height:        Intake/Output Summary (Last 24 hours) at 01/27/17 0916 Last data filed at 01/27/17 0731  Gross per 24 hour  Intake            572.5 ml  Output                0 ml  Net            572.5 ml    Exam:  Examination:  General exam: Appears calm and comfortable  Respiratory system: Clear to auscultation. Respiratory effort normal. Cardiovascular system: S1 & S2 heard, RRR. No JVD, murmurs, rubs, gallops or clicks. No pedal edema. Gastrointestinal system: Abdomen is nondistended, soft and nontender. No organomegaly or masses felt. Normal bowel sounds heard. Central nervous system: Alert and oriented. No focal neurological deficits. Extremities: Symmetric 5 x 5 power. Skin: No rashes, lesions or ulcers Psychiatry: Judgement and insight appear normal. Mood & affect appropriate.     Data Reviewed: I have personally reviewed following labs and imaging studies  Micro Results No results found for this or any previous visit (from the past 240 hour(s)).  Radiology Reports Ct Head  Hensley Contrast  Result Date: 01/26/2017 CLINICAL DATA:  71 year old male who awoke with dizziness and slurred speech today. Last known well 2300 hours yesterday. EXAM: CT HEAD WITHOUT CONTRAST TECHNIQUE: Contiguous axial images were obtained from the base of the skull through the vertex without intravenous contrast. COMPARISON:  None. FINDINGS: Jacob: Cerebral volume is within normal limits for age. No midline shift, ventriculomegaly, mass effect, evidence of mass lesion, intracranial hemorrhage or evidence of cortically based acute infarction. Gray-white matter differentiation is within normal limits  throughout the Jacob. No encephalomalacia identified. Vascular: Calcified atherosclerosis at the skull base. Dominant appearing distal right vertebral artery. No suspicious intracranial vascular hyperdensity. Skull: No osseous abnormality identified. Sinuses/Orbits: Clear. Other: Mild postoperative changes to the right globe. No acute orbit or scalp soft tissue findings identified. IMPRESSION: Normal for age non contrast CT appearance of the Jacob. Electronically Signed   By: Genevie Ann M.D.   On: 01/26/2017 15:46   Mr Jacob Hensley Head Hensley Contrast  Result Date: 01/27/2017 CLINICAL DATA:  71 y/o  M; pontine infarction. EXAM: MRA HEAD WITHOUT CONTRAST TECHNIQUE: Angiographic images of the Circle of Willis were obtained using MRA technique without intravenous contrast. COMPARISON:  01/26/2017 MRI of the head FINDINGS: Internal carotid arteries:  Patent. Anterior cerebral arteries:  Patent. Middle cerebral arteries: Patent. Anterior communicating artery: Not identified, likely hypoplastic or absent. Posterior communicating arteries: Patent right. No left identified, likely hypoplastic or absent. Posterior cerebral arteries:  Patent. Basilar artery:  Patent. Vertebral arteries:  Patent. No evidence of high-grade stenosis, large vessel occlusion, or aneurysm unless noted above. IMPRESSION: No large vessel occlusion, aneurysm, or significant stenosis is identified. Unremarkable MRA of the head. Electronically Signed   By: Kristine Garbe M.D.   On: 01/27/2017 02:39   Mr Jacob Hensley Contrast (neuro Protocol)  Result Date: 01/26/2017 CLINICAL DATA:  71 year old male who awoke with dizziness and slurred speech today. Last known well 2300 hours yesterday. EXAM: MRI HEAD WITHOUT CONTRAST TECHNIQUE: Multiplanar, multiecho pulse sequences of the Jacob and surrounding structures were obtained without intravenous contrast. COMPARISON:  Head CT 1539 hours today. FINDINGS: Jacob: There is a linear focus of restricted diffusion along  the right inferior pons best seen on series 3, image 15. There is little to no associated T2 and FLAIR hyperintensity at this time. No associated hemorrhage or mass effect. No other restricted diffusion. However, there is a chronic lacunar infarct along the medial right thalamus (series 6, image 14). Elsewhere gray and white matter signal is largely normal for age. No cortical encephalomalacia or chronic cerebral blood products identified. No restricted diffusion to suggest acute infarction. No midline shift, mass effect, evidence of mass lesion, ventriculomegaly, extra-axial collection or acute intracranial hemorrhage. Cervicomedullary junction and pituitary are within normal limits. Vascular: Major intracranial vascular flow voids are preserved, the distal right vertebral artery appears dominant. Skull and upper cervical spine: Negative. Normal bone marrow signal. Sinuses/Orbits: Normal orbits soft tissues. Visualized paranasal sinuses and mastoids are stable and well pneumatized. Other: Visible internal auditory structures appear normal. Negative scalp soft tissues. IMPRESSION: 1. Small acute linear infarct in the right pons, Basilar Artery perforator branch territory. No associated hemorrhage or mass effect. 2. Small chronic lacunar infarct in the medial right thalamus. Electronically Signed   By: Genevie Ann M.D.   On: 01/26/2017 20:55     CBC  Recent Labs Lab 01/26/17 1559 01/26/17 1622  WBC 6.5  --   HGB 15.8 15.6  HCT 44.8 46.0  PLT 136*  --  MCV 83.6  --   MCH 29.5  --   MCHC 35.3  --   RDW 13.3  --   LYMPHSABS 1.9  --   MONOABS 0.5  --   EOSABS 0.1  --   BASOSABS 0.0  --     Chemistries   Recent Labs Lab 01/26/17 1559 01/26/17 1622  NA 140 142  K 4.1 4.1  CL 109 105  CO2 25  --   GLUCOSE 111* 104*  BUN 19 18  CREATININE 0.90 0.80  CALCIUM 9.3  --   AST 28  --   ALT 32  --   ALKPHOS 78  --   BILITOT 0.3  --     ------------------------------------------------------------------------------------------------------------------ estimated creatinine clearance is 87.4 mL/min (by C-G formula based on SCr of 0.8 mg/dL). ------------------------------------------------------------------------------------------------------------------ No results for input(s): HGBA1C in the last 72 hours. ------------------------------------------------------------------------------------------------------------------  Recent Labs  01/27/17 0410  CHOL 142  HDL 39*  LDLCALC 72  TRIG 153*  CHOLHDL 3.6   ------------------------------------------------------------------------------------------------------------------ No results for input(s): TSH, T4TOTAL, T3FREE, THYROIDAB in the last 72 hours.  Invalid input(s): FREET3 ------------------------------------------------------------------------------------------------------------------ No results for input(s): VITAMINB12, FOLATE, FERRITIN, TIBC, IRON, RETICCTPCT in the last 72 hours.  Coagulation profile  Recent Labs Lab 01/26/17 1559  INR 0.98    No results for input(s): DDIMER in the last 72 hours.  Cardiac Enzymes No results for input(s): CKMB, TROPONINI, MYOGLOBIN in the last 168 hours.  Invalid input(s): CK ------------------------------------------------------------------------------------------------------------------ Invalid input(s): POCBNP   CBG:  Recent Labs Lab 01/27/17 0824  GLUCAP 124*       Studies: Ct Head Hensley Contrast  Result Date: 01/26/2017 CLINICAL DATA:  71 year old male who awoke with dizziness and slurred speech today. Last known well 2300 hours yesterday. EXAM: CT HEAD WITHOUT CONTRAST TECHNIQUE: Contiguous axial images were obtained from the base of the skull through the vertex without intravenous contrast. COMPARISON:  None. FINDINGS: Jacob: Cerebral volume is within normal limits for age. No midline shift, ventriculomegaly,  mass effect, evidence of mass lesion, intracranial hemorrhage or evidence of cortically based acute infarction. Gray-white matter differentiation is within normal limits throughout the Jacob. No encephalomalacia identified. Vascular: Calcified atherosclerosis at the skull base. Dominant appearing distal right vertebral artery. No suspicious intracranial vascular hyperdensity. Skull: No osseous abnormality identified. Sinuses/Orbits: Clear. Other: Mild postoperative changes to the right globe. No acute orbit or scalp soft tissue findings identified. IMPRESSION: Normal for age non contrast CT appearance of the Jacob. Electronically Signed   By: Genevie Ann M.D.   On: 01/26/2017 15:46   Mr Jacob Hensley Head Hensley Contrast  Result Date: 01/27/2017 CLINICAL DATA:  71 y/o  M; pontine infarction. EXAM: MRA HEAD WITHOUT CONTRAST TECHNIQUE: Angiographic images of the Circle of Willis were obtained using MRA technique without intravenous contrast. COMPARISON:  01/26/2017 MRI of the head FINDINGS: Internal carotid arteries:  Patent. Anterior cerebral arteries:  Patent. Middle cerebral arteries: Patent. Anterior communicating artery: Not identified, likely hypoplastic or absent. Posterior communicating arteries: Patent right. No left identified, likely hypoplastic or absent. Posterior cerebral arteries:  Patent. Basilar artery:  Patent. Vertebral arteries:  Patent. No evidence of high-grade stenosis, large vessel occlusion, or aneurysm unless noted above. IMPRESSION: No large vessel occlusion, aneurysm, or significant stenosis is identified. Unremarkable MRA of the head. Electronically Signed   By: Kristine Garbe M.D.   On: 01/27/2017 02:39   Mr Jacob Hensley Contrast (neuro Protocol)  Result Date: 01/26/2017 CLINICAL DATA:  71 year old male who awoke with dizziness and  slurred speech today. Last known well 2300 hours yesterday. EXAM: MRI HEAD WITHOUT CONTRAST TECHNIQUE: Multiplanar, multiecho pulse sequences of the Jacob and  surrounding structures were obtained without intravenous contrast. COMPARISON:  Head CT 1539 hours today. FINDINGS: Jacob: There is a linear focus of restricted diffusion along the right inferior pons best seen on series 3, image 15. There is little to no associated T2 and FLAIR hyperintensity at this time. No associated hemorrhage or mass effect. No other restricted diffusion. However, there is a chronic lacunar infarct along the medial right thalamus (series 6, image 14). Elsewhere gray and white matter signal is largely normal for age. No cortical encephalomalacia or chronic cerebral blood products identified. No restricted diffusion to suggest acute infarction. No midline shift, mass effect, evidence of mass lesion, ventriculomegaly, extra-axial collection or acute intracranial hemorrhage. Cervicomedullary junction and pituitary are within normal limits. Vascular: Major intracranial vascular flow voids are preserved, the distal right vertebral artery appears dominant. Skull and upper cervical spine: Negative. Normal bone marrow signal. Sinuses/Orbits: Normal orbits soft tissues. Visualized paranasal sinuses and mastoids are stable and well pneumatized. Other: Visible internal auditory structures appear normal. Negative scalp soft tissues. IMPRESSION: 1. Small acute linear infarct in the right pons, Basilar Artery perforator branch territory. No associated hemorrhage or mass effect. 2. Small chronic lacunar infarct in the medial right thalamus. Electronically Signed   By: Genevie Ann M.D.   On: 01/26/2017 20:55      No results found for: HGBA1C Lab Results  Component Value Date   LDLCALC 72 01/27/2017   CREATININE 0.80 01/26/2017       Scheduled Meds: . aspirin EC  81 mg Oral Daily  . clopidogrel  75 mg Oral Daily  . enoxaparin (LOVENOX) injection  40 mg Subcutaneous Q24H  . metoprolol succinate  25 mg Oral QAC breakfast  . rosuvastatin  20 mg Oral QHS   Continuous Infusions: . sodium chloride  75 mL/hr at 01/26/17 2353     LOS: 1 day    Time spent: >30 MINS    Reyne Dumas  [facility] Pager [phone]. If 7PM-7AM, please contact night-coverage at [email or web address], password [facility] 01/27/2017, 9:16 AM  LOS: 1 day

## 2017-01-27 NOTE — Progress Notes (Signed)
STROKE TEAM PROGRESS NOTE   HISTORY OF PRESENT ILLNESS (per record) Jacob Hensley is a 71 year-old male with a history of hypertension, hyperlipidemia, diet-controlled diabetes, remote DVT, CAD, CABG, and BPH, who presented 01/26/2017 to the [facility] ED with dizziness, slurred speech and gait instability.  The patient states that he woke up around 8 AM on 01/26/2017 feeling slightly. His wife noted that he had a wobbly gait slurred speech.  LKN 23:00 on 01/15/2017.  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  He currently takes 81mg  ASA daily.   Patient was not administered IV t-PA secondary to arriving outside of the treatment window. He was admitted to General Neurology for further evaluation and treatment.   SUBJECTIVE (INTERVAL HISTORY) He is alone in the room, alert and oriented, ambumbulatory and independently performing ADLs. His slurry speech much improved, close to baseline. Still has slight left nasolabial fold flattening. He just came back from long trip and car ride, denies any discomfort in legs. He had a history of a DVT status post CABG in 2003.   OBJECTIVE Temp:  [97.6 F (36.4 C)-98.9 F (37.2 C)] 97.7 F (36.5 C) (07/02 0800) Pulse Rate:  [53-63] 56 (07/02 0800) Cardiac Rhythm: Sinus bradycardia (07/02 0701) Resp:  [13-18] 16 (07/02 0800) BP: (121-169)/(56-98) 128/56 (07/02 0800) SpO2:  [92 %-97 %] 97 % (07/02 0800) Weight:  [79.8 kg (176 lb)] 79.8 kg (176 lb) (07/01 1453)  CBC:  Recent Labs Lab 01/26/17 1559 01/26/17 1622  WBC 6.5  --   NEUTROABS 4.1  --   HGB 15.8 15.6  HCT 44.8 46.0  MCV 83.6  --   PLT 136*  --     Basic Metabolic Panel:  Recent Labs Lab 01/26/17 1559 01/26/17 1622  NA 140 142  K 4.1 4.1  CL 109 105  CO2 25  --   GLUCOSE 111* 104*  BUN 19 18  CREATININE 0.90 0.80  CALCIUM 9.3  --     Lipid Panel:    Component Value Date/Time   CHOL 142 01/27/2017  0410   TRIG 153 (H) 01/27/2017 0410   HDL 39 (L) 01/27/2017 0410   CHOLHDL 3.6 01/27/2017 0410   VLDL 31 01/27/2017 0410   LDLCALC 72 01/27/2017 0410   HgbA1c: No results found for: HGBA1C Urine Drug Screen: No results found for: LABOPIA, COCAINSCRNUR, LABBENZ, AMPHETMU, THCU, LABBARB  Alcohol Level No results found for: [place] I have personally reviewed the radiological images below and agree with the radiology interpretations.  Ct Head Wo Contrast 01/26/2017 IMPRESSION: Normal for age non contrast CT appearance of the brain.  [name] Head Wo Contrast 01/27/2017 IMPRESSION: No large vessel occlusion, aneurysm, or significant stenosis is identified. Unremarkable MRA of the head.  Mr Brain Wo Contrast (neuro Protocol) 01/26/2017 IMPRESSION: 1. Small acute linear infarct in the right pons, Basilar Artery perforator branch territory. No associated hemorrhage or mass effect. 2. Small chronic lacunar infarct in the medial right thalamus.  CUS - Bilateral: 1-39% ICA stenosis. Vertebral artery flow is antegrade.  TTE - pending  LE venous Doppler pending   PHYSICAL EXAM  Temp:  [97.6 F (36.4 C)-98.9 F (37.2 C)] 98.6 F (37 C) (07/02 1200) Pulse Rate:  [53-63] 54 (07/02 1200) Resp:  [13-18] 16 (07/02 1200) BP: (121-169)/(56-98) 130/81 (07/02 1200) SpO2:  [92 %-97 %] 94 % (07/02 1200) Weight:  [176 lb (79.8 kg)]  176 lb (79.8 kg) (07/01 1453)  General - Well nourished, well developed, in no apparent distress.  Ophthalmologic - Sharp disc margins OU.   Cardiovascular - Regular rate and rhythm.  Mental Status -  Level of arousal and orientation to time, place, and person were intact. Language including expression, naming, repetition, comprehension was assessed and found intact. Fund of Knowledge was assessed and was intact.  Cranial Nerves II - XII - II - Visual field intact OU. III, IV, VI - Extraocular movements intact. V - Facial sensation intact bilaterally. VII -  mild left nasolabial fold flattening. VIII - Hearing & vestibular intact bilaterally. X - Palate elevates symmetrically. XI - Chin turning & shoulder shrug intact bilaterally. XII - Tongue protrusion intact.  Motor Strength - The patient's strength was normal in all extremities and pronator drift was absent.  Bulk was normal and fasciculations were absent.   Motor Tone - Muscle tone was assessed at the neck and appendages and was normal.  Reflexes - The patient's reflexes were 1+ in all extremities and he had no pathological reflexes.  Sensory - Light touch, temperature/pinprick were assessed and were symmetrical.    Coordination - The patient had normal movements in the hands with no ataxia or dysmetria.  Tremor was absent.  Gait and Station - The patient's transfers, posture, gait, station, and turns were observed as normal.   ASSESSMENT/PLAN Mr. Jacob Hensley is a 71 y.o. male with history of  hypertension, hyperlipidemia, diet-controlled diabetes, remote DVT, CAD, CABG, and BPH, presenting with dizziness, slurred speech and gait instability. He did not receive IV t-PA due to arriving outside of the treatment window.   Stroke: small acute linear infarct in the right pons, BA territory, likely secondary to small vessel disease source  Resultant  mild left nasolabial fold flattening  CT head: no acute stroke  MRI head: No large vessel occlusion, aneurysm, or significant stenosis is identified  MRA head: Unremarkable MRA of the head  Carotid Doppler: Unremarkable  2D Echo  pending  LE venous Doppler pending  LDL 72  HgbA1c pending  SCDs for VTE prophylaxis  Diet Heart Room service appropriate? Yes; Fluid consistency: Thin  aspirin 81 mg daily prior to admission, now on aspirin 81 mg daily and clopidogrel 75 mg daily. Continue DAPT for 3-4 weeks according to CHANCE trial, then ASA or plavix alone.   Patient counseled to be compliant with his antithrombotic  medications  Ongoing aggressive stroke risk factor management  Therapy recommendations:  pending  Disposition:  Pending  History of DVT  In 2003 s/p CABG  Pt also just s/p long bus ride PTA  LE venous Doppler pending  Hypertension  Stable  Permissive hypertension (OK if < 220/120) but gradually normalize in 5-7 days  On metoprolol  Long-term BP goal normotensive  Hyperlipidemia  Home meds: rosuvastatin  LDL 72, goal < 70  Crestor resumed  Continue statin at discharge  Diabetes  HgbA1c pending, goal < 7.0  Controlled  CBG monitoring  Other Stroke Risk Factors  Advanced age  Coronary artery disease status post CABG  Other Active Problems  None  Hospital day # 1  Rosalin Hawking, MD PhD Stroke Neurology 01/27/2017 2:36 PM    To contact Stroke Continuity provider, please refer to [email or web address] After hours, contact General Neurology

## 2017-01-27 NOTE — Evaluation (Signed)
Physical Therapy Evaluation Patient Details Name: Jacob Hensley MRN: [id number] DOB: 04-01-1946 Today's Date: 01/27/2017   History of Present Illness  71 y.o. male with medical history significant of hypertension, hyperlipidemia, diet-controlled diabetes, remote DVT, CAD, CABG, BPH, who presents with dizziness, slurred speech and gait instability. MRI confirmed acute linear infarct in the right pons, Basilar Artery perforator branch territory.  Clinical Impression  Orders received for PT evaluation. Patient demonstrates very modest deficits in functional mobility. Educated patient extensively regarding BEFAST stroke signs as well as safety with mobility.  Patient does not feel he is 100% at his balance baseline, will recommend outpatient neuro PT follow for further high level balance assessment and activity program. Will defer all further acute PT needs at this time. Feel patient will be safe for d/c home.      Follow Up Recommendations Outpatient PT (for extensive high level balance and activity program )    Equipment Recommendations  None recommended by PT    Recommendations for Other Services       Precautions / Restrictions Precautions Precautions: Fall      Mobility  Bed Mobility Overal bed mobility: Modified Independent                Transfers Overall transfer level: Needs assistance   Transfers: Sit to/from Stand Sit to Stand: Supervision         General transfer comment: Initially minimally unsteady. IMproved as pt increased mobility.   Ambulation/Gait Ambulation/Gait assistance: Independent Ambulation Distance (Feet): 610 Feet Assistive device: None Gait Pattern/deviations: WFL(Within Functional Limits) Gait velocity: modestly decreaed via gait speed testing  Gait velocity interpretation: at or above normal speed for age/gender General Gait Details: modest instability but overall no significant deviations noted  Stairs            Wheelchair  Mobility    Modified Rankin (Stroke Patients Only) Modified Rankin (Stroke Patients Only) Pre-Morbid Rankin Score: No symptoms Modified Rankin: Slight disability     Balance Overall balance assessment: Needs assistance   Sitting balance-Leahy Scale: Good       Standing balance-Leahy Scale: Good               High level balance activites: Side stepping;Backward walking;Direction changes;Turns;Sudden stops;Head turns High Level Balance Comments: supervision for safety but no significant deficits, modestly decreased in backward walking Standardized Balance Assessment Standardized Balance Assessment : Dynamic Gait Index   Dynamic Gait Index Level Surface: Normal Change in Gait Speed: Mild Impairment Gait with Horizontal Head Turns: Normal Gait with Vertical Head Turns: Normal Gait and Pivot Turn: Normal Step Over Obstacle: Mild Impairment Step Around Obstacles: Mild Impairment Steps: Mild Impairment Total Score: 20       Pertinent Vitals/Pain      Home Living Family/patient expects to be discharged to:: Private residence Living Arrangements: Spouse/significant other Available Help at Discharge: Family;Available 24 hours/day Type of Home: House Home Access: Stairs to enter Entrance Stairs-Rails: Right Entrance Stairs-Number of Steps: 6-8 Home Layout: Two level;Able to live on main level with bedroom/bathroom Home Equipment: Crutches      Prior Function Level of Independence: Independent         Comments: Driving for [place]   Dominant Hand: Right    Extremity/Trunk Assessment   Upper Extremity Assessment Upper Extremity Assessment: Overall WFL for tasks assessed    Lower Extremity Assessment Lower Extremity Assessment: Overall WFL for tasks assessed    Cervical / Trunk  Assessment Cervical / Trunk Assessment: Normal  Communication   Communication: Expressive difficulties  Cognition Arousal/Alertness:  Awake/alert Behavior During Therapy: WFL for tasks assessed/performed Overall Cognitive Status: Within Functional Limits for tasks assessed                                        General Comments General comments (skin integrity, edema, etc.): educated on BEFAST stroke education    Exercises     Assessment/Plan    PT Assessment All further PT needs can be met in the next venue of care  PT Problem List Decreased balance       PT Treatment Interventions      PT Goals (Current goals can be found in the Care Plan section)  Acute Rehab PT Goals Patient Stated Goal: to return to current lifestyle PT Goal Formulation: All assessment and education complete, DC therapy    Frequency     Barriers to discharge        Co-evaluation               AM-PAC PT "6 Clicks" Daily Activity  Outcome Measure Difficulty turning over in bed (including adjusting bedclothes, sheets and blankets)?: None Difficulty moving from lying on back to sitting on the side of the bed? : None Difficulty sitting down on and standing up from a chair with arms (e.g., wheelchair, bedside commode, etc,.)?: None Help needed moving to and from a bed to chair (including a wheelchair)?: None Help needed walking in hospital room?: None Help needed climbing 3-5 steps with a railing? : None 6 Click Score: 24    End of Session Equipment Utilized During Treatment: Gait belt Activity Tolerance: Patient tolerated treatment well Patient left: in bed;with call bell/phone within reach;with family/visitor present Nurse Communication: Mobility status PT Visit Diagnosis: Difficulty in walking, not elsewhere classified (R26.2)    Time: 0737-1062 PT Time Calculation (min) (ACUTE ONLY): 20 min   Charges:   PT Evaluation $PT Eval Low Complexity: 1 Procedure     PT G CodesAlben Deeds, PT DPT NCS [phone]   Duncan Dull 01/27/2017, 6:23 PM

## 2017-01-27 NOTE — Progress Notes (Signed)
Pt admitted to the unit from ED; pt A&O x4; MAE x4; NIH 1. VSS; telemetry applied and verified with CCMD and NT called to second verify. Pt oriented to the unit and room; fall/safety precaution and prevention education completed with pt and spouse at bedside. Both voices understanding denies any questions. Pt skin clean, dry and intact with no pressure ulcers or opened wounds noted. Will continue to monitor pt closely. Delia Heady RN

## 2017-01-28 DIAGNOSIS — I6302 Cerebral infarction due to thrombosis of basilar artery: Secondary | ICD-10-CM

## 2017-01-28 DIAGNOSIS — Q2112 Patent foramen ovale: Secondary | ICD-10-CM

## 2017-01-28 DIAGNOSIS — I82411 Acute embolism and thrombosis of right femoral vein: Secondary | ICD-10-CM

## 2017-01-28 DIAGNOSIS — Q211 Atrial septal defect: Secondary | ICD-10-CM

## 2017-01-28 LAB — VAS US CAROTID
LEFT ECA DIAS: -12 cm/s
LEFT VERTEBRAL DIAS: -9 cm/s
Left CCA dist dias: -25 cm/s
Left CCA dist sys: -102 cm/s
Left CCA prox dias: 16 cm/s
Left CCA prox sys: 109 cm/s
Left ICA dist dias: -28 cm/s
Left ICA dist sys: -76 cm/s
Left ICA prox dias: -18 cm/s
Left ICA prox sys: -60 cm/s
RIGHT ECA DIAS: -14 cm/s
RIGHT VERTEBRAL DIAS: -17 cm/s
Right CCA prox dias: 17 cm/s
Right CCA prox sys: 103 cm/s
Right cca dist sys: -39 cm/s

## 2017-01-28 LAB — HEMOGLOBIN A1C
Hgb A1c MFr Bld: 6.7 % — ABNORMAL HIGH (ref 4.8–5.6)
Mean Plasma Glucose: 146 mg/dL

## 2017-01-28 LAB — GLUCOSE, CAPILLARY
Glucose-Capillary: 129 mg/dL — ABNORMAL HIGH (ref 65–99)
Glucose-Capillary: 129 mg/dL — ABNORMAL HIGH (ref 65–99)

## 2017-01-28 MED ORDER — APIXABAN 5 MG PO TABS: 5.0000 mg | ORAL_TABLET | Freq: Two times a day (BID) | ORAL | 10 refills | Status: DC

## 2017-01-28 MED ORDER — APIXABAN 5 MG PO TABS
5.0000 mg | ORAL_TABLET | Freq: Two times a day (BID) | ORAL | Status: DC
  Administered 2017-01-28: 5 mg via ORAL
  Filled 2017-01-28: qty 1

## 2017-01-28 NOTE — Care Management Note (Signed)
Case Management Note  Patient Details  Name: Jacob Hensley MRN: [id number] Date of Birth: 1945/10/28  Subjective/Objective:                    Action/Plan: Pt discharging home with self care. CM consulted for outpatient therapy. CM met with the patient and his wife and inquired about outpatient therapy. He is interested in going to [facility]. Orders in EPIC and information on the AVS.  CM also provided him with a 30 day free card for his Eliquis. Pt uses mail order and will have his PCP send them the script for Eliquis during the 30 day free period. Wife to provide transportation home.   Expected Discharge Date:  01/28/17               Expected Discharge Plan:  OP Rehab  In-House Referral:     Discharge planning Services  CM Consult  Post Acute Care Choice:    Choice offered to:     DME Arranged:    DME Agency:     HH Arranged:    HH Agency:     Status of Service:  Completed, signed off  If discussed at [employer] of Stay Meetings, dates discussed:    Additional Comments:  Pollie Friar, RN 01/28/2017, 2:56 PM

## 2017-01-28 NOTE — Progress Notes (Signed)
STROKE TEAM PROGRESS NOTE   SUBJECTIVE (INTERVAL HISTORY) His wife is at bedside. I had long discussion with pt and his wife at bedside, updated pt current condition, treatment plan and bleeding precautions. They expressed understanding and appreciation. And they agree with eliquis 5mg  bid and ASA 81mg . Will follow up in clinic.     OBJECTIVE Temp:  [97.6 F (36.4 C)-98.9 F (37.2 C)] 97.7 F (36.5 C) (07/03 1004) Pulse Rate:  [49-58] 58 (07/03 1004) Cardiac Rhythm: Sinus bradycardia (07/03 0700) Resp:  [15-18] 18 (07/03 1004) BP: (114-164)/(59-100) 137/79 (07/03 1004) SpO2:  [94 %-98 %] 96 % (07/03 1004)  CBC:   Recent Labs Lab 01/26/17 1559 01/26/17 1622  WBC 6.5  --   NEUTROABS 4.1  --   HGB 15.8 15.6  HCT 44.8 46.0  MCV 83.6  --   PLT 136*  --     Basic Metabolic Panel:   Recent Labs Lab 01/26/17 1559 01/26/17 1622  NA 140 142  K 4.1 4.1  CL 109 105  CO2 25  --   GLUCOSE 111* 104*  BUN 19 18  CREATININE 0.90 0.80  CALCIUM 9.3  --     Lipid Panel:     Component Value Date/Time   CHOL 142 01/27/2017 0410   TRIG 153 (H) 01/27/2017 0410   HDL 39 (L) 01/27/2017 0410   CHOLHDL 3.6 01/27/2017 0410   VLDL 31 01/27/2017 0410   LDLCALC 72 01/27/2017 0410   HgbA1c:  Lab Results  Component Value Date   HGBA1C 6.7 (H) 01/27/2017   Urine Drug Screen: No results found for: LABOPIA, COCAINSCRNUR, LABBENZ, AMPHETMU, THCU, LABBARB  Alcohol Level No results found for: [place] I have personally reviewed the radiological images below and agree with the radiology interpretations.  Ct Head Wo Contrast 01/26/2017 IMPRESSION: Normal for age non contrast CT appearance of the brain.  [name] Head Wo Contrast 01/27/2017 IMPRESSION: No large vessel occlusion, aneurysm, or significant stenosis is identified. Unremarkable MRA of the head.  Mr Brain Wo Contrast (neuro Protocol) 01/26/2017 IMPRESSION: 1. Small acute linear infarct in the right pons, Basilar Artery  perforator branch territory. No associated hemorrhage or mass effect. 2. Small chronic lacunar infarct in the medial right thalamus.  CUS - Bilateral: 1-39% ICA stenosis. Vertebral artery flow is antegrade.  TTE - Left ventricle: The cavity size was normal. Wall thickness was   increased in a pattern of mild LVH. Systolic function was normal.   The estimated ejection fraction was in the range of 60% to 65%.   Doppler parameters are consistent with abnormal left ventricular   relaxation (grade 1 diastolic dysfunction). - Aortic valve: There was mild regurgitation.  LE venous Doppler Right lower extremity is positive for subacute versus age indeterminate deep vein thrombosis involving the distal right femoral vein, right popliteal vein, right posterior tibial veins, right peroneal veins, and right gastrocnemius veins. There is no evidence of left lower extremity deep vein thrombosis or Baker's cyst bilaterally  TCD bubble study - High intensity transient signals (HITS) (17) were heard with Valsalva, suggestive of a small patent foramen ovale (PFO) with spencer degree II.   PHYSICAL EXAM  Temp:  [97.6 F (36.4 C)-98.9 F (37.2 C)] 97.7 F (36.5 C) (07/03 1004) Pulse Rate:  [49-58] 58 (07/03 1004) Resp:  [15-18] 18 (07/03 1004) BP: (114-164)/(59-100) 137/79 (07/03 1004) SpO2:  [94 %-98 %] 96 % (07/03 1004)  General - Well nourished, well developed, in no apparent distress.  Ophthalmologic - Sharp disc margins OU.   Cardiovascular - Regular rate and rhythm.  Mental Status -  Level of arousal and orientation to time, place, and person were intact. Language including expression, naming, repetition, comprehension was assessed and found intact. Fund of Knowledge was assessed and was intact.  Cranial Nerves II - XII - II - Visual field intact OU. III, IV, VI - Extraocular movements intact. V - Facial sensation intact bilaterally. VII - facial symmetric VIII - Hearing & vestibular  intact bilaterally. X - Palate elevates symmetrically. XI - Chin turning & shoulder shrug intact bilaterally. XII - Tongue protrusion intact.  Motor Strength - The patient's strength was normal in all extremities and pronator drift was absent.  Bulk was normal and fasciculations were absent.   Motor Tone - Muscle tone was assessed at the neck and appendages and was normal.  Reflexes - The patient's reflexes were 1+ in all extremities and he had no pathological reflexes.  Sensory - Light touch, temperature/pinprick were assessed and were symmetrical.    Coordination - The patient had normal movements in the hands with no ataxia or dysmetria.  Tremor was absent.  Gait and Station - The patient's transfers, posture, gait, station, and turns were observed as normal.   ASSESSMENT/PLAN Mr. Jacob Hensley is a 71 y.o. male with history of  hypertension, hyperlipidemia, diet-controlled diabetes, remote DVT, CAD, CABG, and BPH, presenting with dizziness, slurred speech and gait instability. He did not receive IV t-PA due to arriving outside of the treatment window.   Stroke: small acute linear infarct in the right pons, BA territory, likely secondary to small vessel disease source. However, due to right LE DVT and small PFO, paradoxical emboli still cannot be completely excluded.  Resultant  mild left nasolabial fold flattening  CT head: no acute stroke  MRI head: No large vessel occlusion, aneurysm, or significant stenosis is identified  MRA head: Unremarkable MRA of the head  Carotid Doppler: Unremarkable  2D Echo  EF 60-65%  TCD bubble study spencer degree II PFO with valsalva maneuver  LE venous Doppler right LE subacute DVT  LDL 72  HgbA1c 6.7  SCDs for VTE prophylaxis Diet Heart Room service appropriate? Yes; Fluid consistency: Thin  aspirin 81 mg daily prior to admission, now on aspirin 81 mg daily and clopidogrel 75 mg daily. Had a lengthy discussion with patient and  wife, they are in agreement with aspirin 81 and eliquis 5mg  twice a day.  Patient counseled to be compliant with his antithrombotic medications  Ongoing aggressive stroke risk factor management  Therapy recommendations:  pending  Disposition:  Pending  Recurrent right LE DVT  First LE DVT in 2003 s/p CABG  LE venous doppler right LE subacute DVT  Pt just s/p long bus ride PTA  Due to recurrent DVT and patient occupation, recommend long-term anticoagulation for DVT prophylaxis. Patient and wife agreed on eliquis 5mg  bid.  Hypertension  Stable, on metoprolol  Long-term BP goal normotensive  Hyperlipidemia  Home meds: rosuvastatin  LDL 72, goal < 70  Crestor resumed  Continue statin at discharge  Diabetes  HgbA1c 6.7, goal < 7.0  Controlled  CBG monitoring  Other Stroke Risk Factors  Advanced age  Coronary artery disease status post CABG - continue aspirin 81 for cardiac prevention  Other Active Problems  None  Hospital day # 2   Neurology will sign off. Please call with questions. Pt will follow up with Dr. Erlinda Hong at [facility] in about  6 weeks. Thanks for the consult.  Rosalin Hawking, MD PhD Stroke Neurology 01/28/2017 11:21 AM    To contact Stroke Continuity provider, please refer to [email or web address] After hours, contact General Neurology

## 2017-01-28 NOTE — Discharge Summary (Signed)
Physician Discharge Summary  Jacob Hensley MRN: [id number] DOB/AGE: 1946/02/10 71 y.o.  PCP: Thressa Sheller, MD   Admit date: 01/26/2017 Discharge date: 01/28/2017  Discharge Diagnoses:    Principal Problem:   Stroke (cerebrum) [facility]) Active Problems:   CAD s/p CABG   Essential hypertension   Hyperlipidemia   BPH (benign prostatic hyperplasia) New onset diabetes   Follow-up recommendations Follow-up with PCP in 3-5 days , including all  additional recommended appointments as below Follow-up CBC, CMP in 3-5 days Patient to follow-up with Dr.Xu, Jindong, MD, in 4-6 weeks Repeat hemoglobin A1c in 3 months     Current Discharge Medication List    START taking these medications   Details  apixaban (ELIQUIS) 5 MG TABS tablet Take 1 tablet (5 mg total) by mouth 2 (two) times daily. Qty: 60 tablet, Refills: 10      CONTINUE these medications which have CHANGED   Details  rosuvastatin (CRESTOR) 40 MG tablet Take 1 tablet (40 mg total) by mouth at bedtime. Qty: 30 tablet, Refills: 1      CONTINUE these medications which have NOT CHANGED   Details  aspirin 81 MG tablet Take 1 tablet (81 mg total) by mouth at bedtime. Qty: 30 tablet    metoprolol (TOPROL-XL) 50 MG 24 hr tablet Take 50 mg by mouth daily before breakfast.     OVER THE COUNTER MEDICATION VITALIZER GOLD  - SHAKELEE FOOD SUPPLEMENT    ramipril (ALTACE) 10 MG capsule Take 10 mg by mouth daily after breakfast.          Discharge Condition:Stable   Discharge Instructions Get Medicines reviewed and adjusted: Please take all your medications with you for your next visit with your Primary MD  Please request your Primary MD to go over all hospital tests and procedure/radiological results at the follow up, please ask your Primary MD to get all Hospital records sent to his/her office.  If you experience worsening of your admission symptoms, develop shortness of breath, life threatening emergency, suicidal  or homicidal thoughts you must seek medical attention immediately by calling 911 or calling your MD immediately if symptoms less severe.  You must read complete instructions/literature along with all the possible adverse reactions/side effects for all the Medicines you take and that have been prescribed to you. Take any new Medicines after you have completely understood and accpet all the possible adverse reactions/side effects.   Do not drive when taking Pain medications.   Do not take more than prescribed Pain, Sleep and Anxiety Medications  Special Instructions: If you have smoked or chewed Tobacco in the last 2 yrs please stop smoking, stop any regular Alcohol and or any Recreational drug use.  Wear Seat belts while driving.  Please note  You were cared for by a hospitalist during your hospital stay. Once you are discharged, your primary care physician will handle any further medical issues. Please note that NO REFILLS for any discharge medications will be authorized once you are discharged, as it is imperative that you return to your primary care physician (or establish a relationship with a primary care physician if you do not have one) for your aftercare needs so that they can reassess your need for medications and monitor your lab values.  Discharge Instructions    Ambulatory referral to Occupational Therapy    Complete by:  As directed    Ambulatory referral to Physical Therapy    Complete by:  As directed    Diet - low  sodium heart healthy    Complete by:  As directed    Diet - low sodium heart healthy    Complete by:  As directed    Increase activity slowly    Complete by:  As directed    Increase activity slowly    Complete by:  As directed        No Known Allergies    Disposition: 01-Home or Self Care   Consults:  Neurology     Significant Diagnostic Studies:  Ct Head Wo Contrast  Result Date: 01/26/2017 CLINICAL DATA:  71 year old male who awoke with  dizziness and slurred speech today. Last known well 2300 hours yesterday. EXAM: CT HEAD WITHOUT CONTRAST TECHNIQUE: Contiguous axial images were obtained from the base of the skull through the vertex without intravenous contrast. COMPARISON:  None. FINDINGS: Brain: Cerebral volume is within normal limits for age. No midline shift, ventriculomegaly, mass effect, evidence of mass lesion, intracranial hemorrhage or evidence of cortically based acute infarction. Gray-white matter differentiation is within normal limits throughout the brain. No encephalomalacia identified. Vascular: Calcified atherosclerosis at the skull base. Dominant appearing distal right vertebral artery. No suspicious intracranial vascular hyperdensity. Skull: No osseous abnormality identified. Sinuses/Orbits: Clear. Other: Mild postoperative changes to the right globe. No acute orbit or scalp soft tissue findings identified. IMPRESSION: Normal for age non contrast CT appearance of the brain. Electronically Signed   By: Genevie Ann M.D.   On: 01/26/2017 15:46   [name] Head Wo Contrast  Result Date: 01/27/2017 CLINICAL DATA:  71 y/o  M; pontine infarction. EXAM: MRA HEAD WITHOUT CONTRAST TECHNIQUE: Angiographic images of the Circle of Willis were obtained using MRA technique without intravenous contrast. COMPARISON:  01/26/2017 MRI of the head FINDINGS: Internal carotid arteries:  Patent. Anterior cerebral arteries:  Patent. Middle cerebral arteries: Patent. Anterior communicating artery: Not identified, likely hypoplastic or absent. Posterior communicating arteries: Patent right. No left identified, likely hypoplastic or absent. Posterior cerebral arteries:  Patent. Basilar artery:  Patent. Vertebral arteries:  Patent. No evidence of high-grade stenosis, large vessel occlusion, or aneurysm unless noted above. IMPRESSION: No large vessel occlusion, aneurysm, or significant stenosis is identified. Unremarkable MRA of the head. Electronically Signed    By: Kristine Garbe M.D.   On: 01/27/2017 02:39   Mr Brain Wo Contrast (neuro Protocol)  Result Date: 01/26/2017 CLINICAL DATA:  71 year old male who awoke with dizziness and slurred speech today. Last known well 2300 hours yesterday. EXAM: MRI HEAD WITHOUT CONTRAST TECHNIQUE: Multiplanar, multiecho pulse sequences of the brain and surrounding structures were obtained without intravenous contrast. COMPARISON:  Head CT 1539 hours today. FINDINGS: Brain: There is a linear focus of restricted diffusion along the right inferior pons best seen on series 3, image 15. There is little to no associated T2 and FLAIR hyperintensity at this time. No associated hemorrhage or mass effect. No other restricted diffusion. However, there is a chronic lacunar infarct along the medial right thalamus (series 6, image 14). Elsewhere gray and white matter signal is largely normal for age. No cortical encephalomalacia or chronic cerebral blood products identified. No restricted diffusion to suggest acute infarction. No midline shift, mass effect, evidence of mass lesion, ventriculomegaly, extra-axial collection or acute intracranial hemorrhage. Cervicomedullary junction and pituitary are within normal limits. Vascular: Major intracranial vascular flow voids are preserved, the distal right vertebral artery appears dominant. Skull and upper cervical spine: Negative. Normal bone marrow signal. Sinuses/Orbits: Normal orbits soft tissues. Visualized paranasal sinuses and mastoids are stable and  well pneumatized. Other: Visible internal auditory structures appear normal. Negative scalp soft tissues. IMPRESSION: 1. Small acute linear infarct in the right pons, Basilar Artery perforator branch territory. No associated hemorrhage or mass effect. 2. Small chronic lacunar infarct in the medial right thalamus. Electronically Signed   By: Genevie Ann M.D.   On: 01/26/2017 20:55        Filed Weights   01/26/17 1453  Weight: 79.8 kg (176  lb)     Microbiology: No results found for this or any previous visit (from the past 240 hour(s)).     Blood Culture    Component Value Date/Time   SDES URINE, CATHETERIZED 11/08/2011 0800   SPECREQUEST NONE 11/08/2011 0800   CULT ESCHERICHIA COLI KLEBSIELLA PNEUMONIAE 11/08/2011 0800   REPTSTATUS 11/11/2011 FINAL 11/08/2011 0800      Labs: Results for orders placed or performed during the hospital encounter of 01/26/17 (from the past 48 hour(s))  Protime-INR     Status: None   Collection Time: 01/26/17  3:59 PM  Result Value Ref Range   Prothrombin Time 13.0 11.4 - 15.2 seconds   INR 0.98   APTT     Status: None   Collection Time: 01/26/17  3:59 PM  Result Value Ref Range   aPTT 31 24 - 36 seconds  CBC     Status: Abnormal   Collection Time: 01/26/17  3:59 PM  Result Value Ref Range   WBC 6.5 4.0 - 10.5 K/uL   RBC 5.36 4.22 - 5.81 MIL/uL   Hemoglobin 15.8 13.0 - 17.0 g/dL   HCT 44.8 39.0 - 52.0 %   MCV 83.6 78.0 - 100.0 fL   MCH 29.5 26.0 - 34.0 pg   MCHC 35.3 30.0 - 36.0 g/dL   RDW 13.3 11.5 - 15.5 %   Platelets 136 (L) 150 - 400 K/uL  Differential     Status: None   Collection Time: 01/26/17  3:59 PM  Result Value Ref Range   Neutrophils Relative % 62 %   Neutro Abs 4.1 1.7 - 7.7 K/uL   Lymphocytes Relative 29 %   Lymphs Abs 1.9 0.7 - 4.0 K/uL   Monocytes Relative 7 %   Monocytes Absolute 0.5 0.1 - 1.0 K/uL   Eosinophils Relative 2 %   Eosinophils Absolute 0.1 0.0 - 0.7 K/uL   Basophils Relative 0 %   Basophils Absolute 0.0 0.0 - 0.1 K/uL  Comprehensive metabolic panel     Status: Abnormal   Collection Time: 01/26/17  3:59 PM  Result Value Ref Range   Sodium 140 135 - 145 mmol/L   Potassium 4.1 3.5 - 5.1 mmol/L   Chloride 109 101 - 111 mmol/L   CO2 25 22 - 32 mmol/L   Glucose, Bld 111 (H) 65 - 99 mg/dL   BUN 19 6 - 20 mg/dL   Creatinine, Ser 0.90 0.61 - 1.24 mg/dL   Calcium 9.3 8.9 - 10.3 mg/dL   Total Protein 7.8 6.5 - 8.1 g/dL   Albumin 4.4  3.5 - 5.0 g/dL   AST 28 15 - 41 U/L   ALT 32 17 - 63 U/L   Alkaline Phosphatase 78 38 - 126 U/L   Total Bilirubin 0.3 0.3 - 1.2 mg/dL   GFR calc non Af Amer >60 >60 mL/min   GFR calc Af Amer >60 >60 mL/min    Comment: (NOTE) The eGFR has been calculated using the CKD EPI equation. This calculation has not been validated in all  clinical situations. eGFR's persistently <60 mL/min signify possible Chronic Kidney Disease.    Anion gap 6 5 - 15  I-stat troponin, ED     Status: None   Collection Time: 01/26/17  4:21 PM  Result Value Ref Range   Troponin i, poc 0.00 0.00 - 0.08 ng/mL   Comment 3            Comment: Due to the release kinetics of cTnI, a negative result within the first hours of the onset of symptoms does not rule out myocardial infarction with certainty. If myocardial infarction is still suspected, repeat the test at appropriate intervals.   I-Stat Chem 8, ED     Status: Abnormal   Collection Time: 01/26/17  4:22 PM  Result Value Ref Range   Sodium 142 135 - 145 mmol/L   Potassium 4.1 3.5 - 5.1 mmol/L   Chloride 105 101 - 111 mmol/L   BUN 18 6 - 20 mg/dL   Creatinine, Ser 0.80 0.61 - 1.24 mg/dL   Glucose, Bld 104 (H) 65 - 99 mg/dL   Calcium, Ion 1.23 1.15 - 1.40 mmol/L   TCO2 26 0 - 100 mmol/L   Hemoglobin 15.6 13.0 - 17.0 g/dL   HCT 46.0 39.0 - 52.0 %  Hemoglobin A1c     Status: Abnormal   Collection Time: 01/27/17  4:10 AM  Result Value Ref Range   Hgb A1c MFr Bld 6.7 (H) 4.8 - 5.6 %    Comment: (NOTE)         Pre-diabetes: 5.7 - 6.4         Diabetes: >6.4         Glycemic control for adults with diabetes: <7.0    Mean Plasma Glucose 146 mg/dL    Comment: (NOTE) Performed At: [facility] [place], [place] [id number] Lindon Romp MD QM:[id number]   Lipid panel     Status: Abnormal   Collection Time: 01/27/17  4:10 AM  Result Value Ref Range   Cholesterol 142 0 - 200 mg/dL   Triglycerides 153 (H) <150 mg/dL   HDL 39 (L)  >40 mg/dL   Total CHOL/HDL Ratio 3.6 RATIO   VLDL 31 0 - 40 mg/dL   LDL Cholesterol 72 0 - 99 mg/dL    Comment:        Total Cholesterol/HDL:CHD Risk Coronary Heart Disease Risk Table                     Men   Women  1/2 Average Risk   3.4   3.3  Average Risk       5.0   4.4  2 X Average Risk   9.6   7.1  3 X Average Risk  23.4   11.0        Use the calculated Patient Ratio above and the CHD Risk Table to determine the patient's CHD Risk.        ATP III CLASSIFICATION (LDL):  <100     mg/dL   Optimal  100-129  mg/dL   Near or Above                    Optimal  130-159  mg/dL   Borderline  160-189  mg/dL   High  >190     mg/dL   Very High   Glucose, capillary     Status: Abnormal   Collection Time: 01/27/17  8:24 AM  Result Value Ref  Range   Glucose-Capillary 124 (H) 65 - 99 mg/dL  Glucose, capillary     Status: None   Collection Time: 01/27/17  4:52 PM  Result Value Ref Range   Glucose-Capillary 92 65 - 99 mg/dL  Glucose, capillary     Status: Abnormal   Collection Time: 01/27/17 10:27 PM  Result Value Ref Range   Glucose-Capillary 104 (H) 65 - 99 mg/dL   Comment 1 Notify RN    Comment 2 Document in Chart   Glucose, capillary     Status: Abnormal   Collection Time: 01/28/17  6:24 AM  Result Value Ref Range   Glucose-Capillary 129 (H) 65 - 99 mg/dL   Comment 1 Notify RN    Comment 2 Document in Chart   Glucose, capillary     Status: Abnormal   Collection Time: 01/28/17 11:21 AM  Result Value Ref Range   Glucose-Capillary 129 (H) 65 - 99 mg/dL     Lipid Panel     Component Value Date/Time   CHOL 142 01/27/2017 0410   TRIG 153 (H) 01/27/2017 0410   HDL 39 (L) 01/27/2017 0410   CHOLHDL 3.6 01/27/2017 0410   VLDL 31 01/27/2017 0410   LDLCALC 72 01/27/2017 0410     Lab Results  Component Value Date   HGBA1C 6.7 (H) 01/27/2017      HPI   Jacob Hensley is a 72 year-old male with a history of hypertension, hyperlipidemia, diet-controlled diabetes,  remote DVT, CAD, CABG, and BPH, who presented 01/26/2017 to the [facility] ED with dizziness, slurred speech and gait instability.  The patient states that he woke up around 8 AM on 01/26/2017 feeling slightly. His wife noted that he had a wobbly gait slurred speech.  LKN 23:00 on 01/15/2017.  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  He currently takes 86m ASA daily. Patient was not administered IV t-PA secondary to arriving outside of the treatment window. He was admitted to General Neurology for further evaluation and treatment  HOSPITAL COURSE  Stroke (cerebrum) ([facility]: as shown by MRI of brain. Neurology consulted, recommended stroke workup. Telemetry shows sinus rhythm, first-degree AV block MRA did not show any large vessel occlusion Carotid Doppler Bilateral 1-39% Echo  showed EF of 60-65% with mild grade 1 diastolic dysfunction Continue statin, hemoglobin A1c 6.7. PT/OT consult-recommended outpatient OT, PT Venous doppler positive for DVT and TCD positive for small PFO Neurology recommends full dose anticoagulation with eliquis for DVT  as below    CAD s/p CABG 2003:no CP -on ASA 81 mg ,   neurology recommends to continue aspirin for history of Coronary artery disease -lipitor  Essential hypertension: Continue metoprolol   -IV hydralazine prn for SBP>220 mmHG  HLD: Continue rosuvastatin  BPH: stable. Asymptomatic. No taking meds now. - Observe. closely   DVT   neurology recommends starting eliquis  545mPO BID      New-onset diabetes-hemoglobin A1c 6.7 Patient would like to lose weight and repeat hemoglobin A1c in 3 months   Discharge Exam:   Blood pressure 137/79, pulse (!) 58, temperature 97.7 F (36.5 C), temperature source Oral, resp. rate 18, height 5' 9.5" (1.765 m), weight 79.8 kg (176 lb), SpO2 96 %.  Cardiovascular system: S1 & S2 heard, RRR. No JVD, murmurs, rubs, gallops or  clicks. No pedal edema. Gastrointestinal system: Abdomen is nondistended, soft and nontender. No organomegaly or masses felt. Normal  bowel sounds heard. Central nervous system: Alert and oriented. No focal neurological deficits. Extremities: Symmetric 5 x 5 power. Skin: No rashes, lesions or ulcers Psychiatry: Judgement and insight appear normal. Mood & affect appropriate.        SignedReyne Dumas 01/28/2017, 12:44 PM        Time spent >1 hour

## 2017-02-04 ENCOUNTER — Other Ambulatory Visit: Payer: Self-pay

## 2017-02-04 DIAGNOSIS — Z09 Encounter for follow-up examination after completed treatment for conditions other than malignant neoplasm: Secondary | ICD-10-CM | POA: Diagnosis not present

## 2017-02-04 DIAGNOSIS — I639 Cerebral infarction, unspecified: Secondary | ICD-10-CM | POA: Diagnosis not present

## 2017-02-04 DIAGNOSIS — E1122 Type 2 diabetes mellitus with diabetic chronic kidney disease: Secondary | ICD-10-CM | POA: Diagnosis not present

## 2017-02-04 DIAGNOSIS — Z86718 Personal history of other venous thrombosis and embolism: Secondary | ICD-10-CM | POA: Diagnosis not present

## 2017-02-04 NOTE — Patient Outreach (Signed)
[place] [facility]) Care Management  02/04/2017  Jacob Hensley [birth year] [id number]   EMMI-STROKE RED ON EMMI ALERT Day # 6 Date: 01/31/17 Red Alert Reason: "Feeling worse overall? Yes"   RN CM made call back to patient per his request. Spoke with patient who reports he is doing well since discharge home. Reviewed and addressed red alert. patient states that machine misunderstood his response and recorded inaccurate response. He voices that he is not feeling worse. He prefers to himself as one of the "lucky ones" as he has had minimal problems related to his recent stroke. 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 He has supportive spouse to assist with any needs. He had MD f/u appt today and has f/u appt with stroke MD scheduled. No issues with transportation or meds. He denies any RN CM needs or concerns at this time. patient aware of s/s of worsening condition, stroke and when to seek medical attention. Advised patient that they would continue to get automated EMMI-Stroke post discharge calls to assess how they are doing following recent hospitalization and will receive a call from a nurse if any of their responses were abnormal. Patient voiced understanding and was appreciative of f/u call.   Plan: RN CM will notify [facility] administrative assistant of case status.   Enzo Montgomery, RN,BSN,CCM [place] Management Telephonic Care Management Coordinator Direct Phone: [phone] Toll Free: [phone] Fax: [phone]

## 2017-02-04 NOTE — Patient Outreach (Signed)
[facility] [facility]) Care Management  02/04/2017  JAKYRON FABRO 23-Apr-1946 [id number]      EMMI-STROKE RED ON EMMI ALERT Day # 6 Date: 01/31/17 Red Alert Reason: "Feeling worse overall? Yes"    Outreach attempt # 1 to patient. Patient requested call back as he was currently at MD appt.       Plan: RN CM will make outreach attempt to patient within one business day.   Enzo Montgomery, RN,BSN,CCM [place] Management Telephonic Care Management Coordinator Direct Phone: [phone] Toll Free: [phone] Fax: [phone]

## 2017-02-06 NOTE — Progress Notes (Signed)
Fitness for duty form put on Dr. Erlinda Hong desk to review. Dr. Erlinda Hong will return to office on  02/17/2017.

## 2017-02-11 ENCOUNTER — Other Ambulatory Visit: Payer: Self-pay | Admitting: *Deleted

## 2017-02-11 NOTE — Patient Outreach (Signed)
[place] [facility]) Care Management  02/11/2017  Jacob Hensley [birth year] [id number]  Referral via EMMI-Stroke red alert on day# 02/10/2017 for has not had follow up visit.  Call attempt x 1. Patient gave HIPPA verification. Patient voices that there was some trouble with the automated call when he answered call, however he has had follow up appointment on 07/10 at primary care office with PA-Tracy Nicki Reaper.   Patient states he is taking medications as instructed by his doctors. States would like to get Eliquis placed on mail order but does not know how long he needs to take it. Advised patient to talk to his doctor regarding placing Eliquis on mail order & duration of taking medication. Patient voices understanding & states he will call office tomorrow.  Patient voices that he has not had any symptoms of stroke and that speech is completely clear now. Stroke symptoms & action plan reviewed with patient who voices understanding. .  Patient has no further concerns. EMMI call completed.  Plan: Send to care management assistant to close case.   Sherrin Daisy, RN BSN [place] Management Coordinator [facility] Care Management  [phone]

## 2017-02-12 ENCOUNTER — Ambulatory Visit: Payer: Medicare Other | Attending: Internal Medicine

## 2017-02-12 ENCOUNTER — Ambulatory Visit: Payer: Medicare Other | Admitting: Occupational Therapy

## 2017-02-12 ENCOUNTER — Telehealth: Payer: Self-pay | Admitting: Neurology

## 2017-02-12 DIAGNOSIS — R2689 Other abnormalities of gait and mobility: Secondary | ICD-10-CM

## 2017-02-12 DIAGNOSIS — R278 Other lack of coordination: Secondary | ICD-10-CM | POA: Insufficient documentation

## 2017-02-12 DIAGNOSIS — M6281 Muscle weakness (generalized): Secondary | ICD-10-CM | POA: Diagnosis not present

## 2017-02-12 DIAGNOSIS — R42 Dizziness and giddiness: Secondary | ICD-10-CM | POA: Insufficient documentation

## 2017-02-12 DIAGNOSIS — I6302 Cerebral infarction due to thrombosis of basilar artery: Secondary | ICD-10-CM

## 2017-02-12 MED ORDER — APIXABAN 5 MG PO TABS: 5.0000 mg | ORAL_TABLET | Freq: Two times a day (BID) | ORAL | 3 refills | Status: DC

## 2017-02-12 MED ORDER — ROSUVASTATIN CALCIUM 40 MG PO TABS: 40.0000 mg | ORAL_TABLET | Freq: Every day | ORAL | 3 refills | Status: AC

## 2017-02-12 NOTE — Therapy (Signed)
[facility] [address] [place], [place], [address] Phone: [phone]   Fax:  [phone]  Physical Therapy Evaluation  Patient Details  Name: Jacob Hensley MRN: [id number] Date of Birth: June 28, 1946 Referring Provider: Dr. Allyson Sabal  Encounter Date: 02/12/2017      PT End of Session - 02/12/17 1139    Visit Number 1   Number of Visits 5   Date for PT Re-Evaluation 03/14/17   Authorization Type Medicare primary and [place] [employer] secondary. G-CODE AND PN EVERY 10TH VISIT.    PT Start Time 1048   PT Stop Time 1130   PT Time Calculation (min) 42 min   Equipment Utilized During Treatment --  min guard prn   Activity Tolerance Patient tolerated treatment well   Behavior During Therapy WFL for tasks assessed/performed      Past Medical History:  Diagnosis Date  . BPH (benign prostatic hyperplasia)   . Coronary artery disease    CABG 2003-DR. CROITORU IS PT'S CARDIOLOGIST - LAST OFFICE VISIT 08/02/11-PT EXERCISES DAILY-NO C/O OF CHEST PAIN  . Diabetes mellitus    BORDERLINE - PT CHECKS HIS BLOOD SUGARS AT HOME - NO MEDS  . DVT of leg (deep venous thrombosis) ([place]) 2003   S/P CABG SURGERY  . Hyperlipidemia   . Hypertension   . Urine retention    PT STATES HE HAS BEEN DOING I&O CATHS FOR PAST 3 YRS    Past Surgical History:  Procedure Laterality Date  . CORONARY ARTERY BYPASS GRAFT  02/05/2002   LIMA to LAD,RIMA to PDA,SVG to diagonal & free left radial arter to the oblique marginal artery - Dr. Cyndia Bent  . CPET w/PFT  1//20/2014   Normal  . CYSTOSCOPY WITH BIOPSY  11/08/2011   Procedure: CYSTOSCOPY WITH BIOPSY;  Surgeon: Fredricka Bonine, MD;  Location: [facility];  Service: Urology;;  Bladder biopsies  . CYSTOSCOPY WITH BIOPSY  06/19/2012   Procedure: CYSTOSCOPY WITH BIOPSY;  Surgeon: Fredricka Bonine, MD;  Location: [facility];  Service: Urology;  Laterality: N/A;  fulgaration of bleeders  . myolema of  back  2012  . TONSILLECTOMY    . TRANSURETHRAL RESECTION OF PROSTATE  11-08-2011  . US ECHOCARDIOGRAPHY  04/14/2009   EF >50%,borderline dilated RV w/normal systolic fx,mildly dilated LA,mild AI,MR,TR.    There were no vitals filed for this visit.       Subjective Assessment - 02/12/17 1059    Subjective Pt reports he feels balance and gait is back to baseline since 01/26/2017 CVA. However, pt reports he occasionally has a "funny feeling in his head", but denied dizziness.  Pt is walking between 10,000-20,000 steps every day. Pt's CVA sx's were blurred/double vision, slurred speech, and gait issues.   Pertinent History CAD s/p CABG, hx R LE DVT, DM (borderline), hyperlipidemia, HTN   Patient Stated Goals "I'm not doing anything different now, after the stroke."   Currently in Pain? No/denies            [facility] PT Assessment - 02/12/17 1106      Assessment   Medical Diagnosis Stroke (cerebrum)   Referring Provider Dr. Allyson Sabal   Onset Date/Surgical Date 01/26/17   Hand Dominance Right   Prior Therapy none     Precautions   Precautions Other (comment);Fall  R LE DVT   Precaution Comments based on FGA score     Restrictions   Weight Bearing Restrictions No     Balance Screen   Has  the patient fallen in the past 6 months Yes   How many times? 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  No   Is the patient reluctant to leave their home because of a fear of falling?  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   Leisure Travel, drive for work     Cognition   Overall Cognitive Status Within Functional Limits for tasks assessed     Sensation   Light Touch Appears Intact     Coordination   Gross Motor Movements are Fluid and Coordinated Yes   Fine Motor Movements are Fluid and Coordinated Yes     Posture/Postural Control   Posture/Postural Control Postural limitations   Postural Limitations Rounded Shoulders;Forward head     Tone   Assessment Location Right Lower Extremity;Left Lower Extremity     ROM / Strength   AROM / PROM / Strength AROM;Strength     AROM   Overall AROM  Within functional limits for tasks performed   Overall AROM Comments BLE WNL     Strength   Overall Strength Within functional limits for tasks performed   Overall Strength Comments BLE WNL     Transfers   Transfers Sit to Stand;Stand to Sit   Sit to Stand 7: Independent;Without upper extremity assist;From chair/3-in-1   Stand to Sit 7: Independent;Without upper extremity assist;To chair/3-in-1     Ambulation/Gait   Ambulation/Gait Yes   Ambulation/Gait Assistance 7: Independent   Ambulation Distance (Feet) 200 Feet   Assistive device None   Gait Pattern Within Functional Limits;Narrow base of support   Ambulation Surface Level;Indoor   Gait velocity 4.80ft/sec. no AD     Functional Gait  Assessment   Gait assessed  Yes   Gait Level Surface Walks 20 ft in less than 7 sec but greater than 5.5 sec, uses assistive device, slower speed, mild gait deviations, or deviates 6-10 in outside of the 12 in walkway width.  5.6 sec.   Change in Gait Speed Able to change speed, demonstrates mild gait deviations, deviates 6-10 in outside of the 12 in walkway width, or no gait deviations, unable to achieve a major  change in velocity, or uses a change in velocity, or uses an assistive device.   Gait with Horizontal Head Turns Performs head turns with moderate changes in gait velocity, slows down, deviates 10-15 in outside 12 in walkway width but recovers, can continue to walk.   Gait with Vertical Head Turns Performs task with slight change in gait velocity (eg, minor disruption to smooth gait path), deviates 6 - 10 in outside 12 in walkway width or uses assistive device   Gait and Pivot Turn Pivot turns safely within 3 sec and stops  quickly with no loss of balance.   Step Over Obstacle Is able to step over 2 stacked shoe boxes taped together (9 in total height) without changing gait speed. No evidence of imbalance.   Gait with Narrow Base of Support Is able to ambulate for 10 steps heel to toe with no staggering.   Gait with Eyes Closed Walks 20 ft, uses assistive device, slower speed, mild gait deviations, deviates 6-10 in outside 12 in walkway width. Ambulates 20 ft in less than 9 sec but greater than 7 sec.  8.3 sec.    Ambulating Backwards Walks 20 ft, uses assistive device, slower speed, mild gait deviations, deviates 6-10 in outside 12 in walkway width.   Steps Alternating feet, no rail.   Total Score 23   FGA comment: 23/30: indicates moderate falls risk.      RLE Tone   RLE Tone Within Functional Limits     LLE Tone   LLE Tone Within Functional Limits            Objective measurements completed on examination: See above findings.                  PT Education - 02/12/17 1139    Education provided Yes   Education Details PT discussed outcome measure results and PT POC, frequency and duration.   Person(s) Educated Patient   Methods Explanation   Comprehension Verbalized understanding          PT Short Term Goals - 02/12/17 1150      PT SHORT TERM GOAL #1   Title same as LTGs           PT Long Term Goals - 02/12/17 1150      PT LONG TERM GOAL #1   Title Pt  will be IND with HEP to improve balance, posture, and unsteadiness. TARGET DATE: 03/12/17   Status New     PT LONG TERM GOAL #2   Title Pt will improve FGA score to 30/30 to decr. falls risk.   Status New     PT LONG TERM GOAL #3   Title Pt will amb. IND 1000', over even/uneven terrain, while performing intermittent head turns to improve functional mobility.    Status New                Plan - 02/12/17 1142    Clinical Impression Statement Pt is a pleasant 71y/o male presenting to OPPT neuro s/p CVA (R pons per MD note). Pt's PMH significant for the following: CAD s/p CABG, hx R LE DVT, DM (borderline), hyperlipidemia, HTN. The following deficits were noted upon exam: gait deviations during dynamic activities, impaired balance, impaired posture, and dizzy-like symptoms (as pt did not want to call "funny feeling in head" dizziness). Pt's gait speed is WNL. Pt's FGA score indicates pt is at moderate risk for falls. Pt would benefit from skilled PT to improve safety during functional mobility.    History and Personal Factors relevant to plan of care: pt drives a tour bus part-time   Clinical Presentation Stable   Clinical Presentation due to: CAD s/p CABG, hx R LE DVT, DM (borderline), hyperlipidemia, HTN   Clinical Decision Making Moderate   Rehab Potential Good   Clinical Impairments Affecting Rehab Potential see above   PT Frequency 1x / week   PT Duration 4 weeks   PT Treatment/Interventions ADLs/Self Care Home Management;Biofeedback;Canalith Repostioning;Neuromuscular re-education;Balance training;Therapeutic exercise;Therapeutic activities;Manual techniques;Functional mobility training;Stair training;Gait training;DME Instruction;Patient/family education;Vestibular  PT Next Visit Plan Provided balance HEP and perform vestibular assessment.    Consulted and Agree with Plan of Care Patient      Patient will benefit from skilled therapeutic intervention in order to improve the  following deficits and impairments:  Abnormal gait, Decreased balance, Dizziness, Postural dysfunction  Visit Diagnosis: Other abnormalities of gait and mobility - Plan: PT plan of care cert/re-cert  Dizziness and giddiness - Plan: PT plan of care cert/re-cert      G-Codes - 87/86/76 1152    Functional Assessment Tool Used (Outpatient Only) FGA: 23/30   Functional Limitation Mobility: Walking and moving around   Mobility: Walking and Moving Around Current Status [phone]) At least 20 percent but less than 40 percent impaired, limited or restricted   Mobility: Walking and Moving Around Goal Status [phone]) At least 1 percent but less than 20 percent impaired, limited or restricted       Problem List Patient Active Problem List   Diagnosis Date Noted  . Acute deep vein thrombosis (DVT) of femoral vein of right lower extremity ([place])   . PFO (patent foramen ovale)   . Stroke (cerebrum) ([place]) 01/26/2017  . BPH (benign prostatic hyperplasia) 01/26/2017  . Drug-induced bradycardia 08/01/2014  . CAD s/p CABG 08/06/2013  . Essential hypertension 08/06/2013  . Hyperlipidemia 08/06/2013    Niley Helbig L 02/12/2017, 11:54 AM  [facility] [address] [place], [place], [address] Phone: [phone]   Fax:  [phone]  Name: Jacob Hensley MRN: [id number] Date of Birth: [birth year]  Geoffry Paradise, PT,DPT 02/12/17 11:55 AM Phone: [phone] Fax: [phone]

## 2017-02-12 NOTE — Telephone Encounter (Signed)
Called pt and he would like to his eliquis and crestor to be refilled for 3 months to [facility]. I have ordered them. He would like to go back to work and I am OK with it. He said he will have some form to send in to let me fill out. I am OK with that. He expressed appreciation.   Rosalin Hawking, MD PhD Stroke Neurology 02/12/2017 3:19 PM

## 2017-02-12 NOTE — Telephone Encounter (Signed)
DR. Erlinda Hong this patient was seen in the hospital by you. Can you advise him on the medication that was prescribed. Patient has appt in September 2018 with you. Please advise him.

## 2017-02-12 NOTE — Telephone Encounter (Signed)
Patient uses [facility] for his prescriptions. [phone].  Wants a call back from nurse regarding this meds and dosages need to change. Best call back is [phone]

## 2017-02-12 NOTE — Therapy (Signed)
[facility] [address] [place], [place], [address] Phone: [phone]   Fax:  [phone]  Occupational Therapy Evaluation  Patient Details  Name: Jacob Hensley MRN: [id number] Date of Birth: July 19, 1946 Referring Provider: Helayne Seminole, PA-C  Encounter Date: 02/12/2017      OT End of Session - 02/12/17 1313    Visit Number 1   Number of Visits 4   Date for OT Re-Evaluation 03/14/17   Authorization Type MCR - G code needed   Authorization - Visit Number 1   Authorization - Number of Visits 10   OT Start Time 8413   OT Stop Time 1230   OT Time Calculation (min) 45 min   Activity Tolerance Patient tolerated treatment well      Past Medical History:  Diagnosis Date  . BPH (benign prostatic hyperplasia)   . Coronary artery disease    CABG 2003-DR. CROITORU IS PT'S CARDIOLOGIST - LAST OFFICE VISIT 08/02/11-PT EXERCISES DAILY-NO C/O OF CHEST PAIN  . Diabetes mellitus    BORDERLINE - PT CHECKS HIS BLOOD SUGARS AT HOME - NO MEDS  . DVT of leg (deep venous thrombosis) ([place]) 2003   S/P CABG SURGERY  . Hyperlipidemia   . Hypertension   . Urine retention    PT STATES HE HAS BEEN DOING I&O CATHS FOR PAST 3 YRS    Past Surgical History:  Procedure Laterality Date  . CORONARY ARTERY BYPASS GRAFT  02/05/2002   LIMA to LAD,RIMA to PDA,SVG to diagonal & free left radial arter to the oblique marginal artery - Dr. Cyndia Bent  . CPET w/PFT  1//20/2014   Normal  . CYSTOSCOPY WITH BIOPSY  11/08/2011   Procedure: CYSTOSCOPY WITH BIOPSY;  Surgeon: Fredricka Bonine, MD;  Location: [facility];  Service: Urology;;  Bladder biopsies  . CYSTOSCOPY WITH BIOPSY  06/19/2012   Procedure: CYSTOSCOPY WITH BIOPSY;  Surgeon: Fredricka Bonine, MD;  Location: [facility];  Service: Urology;  Laterality: N/A;  fulgaration of bleeders  . myolema of back  2012  . TONSILLECTOMY    . TRANSURETHRAL RESECTION OF PROSTATE  11-08-2011  . US  ECHOCARDIOGRAPHY  04/14/2009   EF >50%,borderline dilated RV w/normal systolic fx,mildly dilated LA,mild AI,MR,TR.    There were no vitals filed for this visit.      Subjective Assessment - 02/12/17 1149    Subjective  I'm doing good   Pertinent History CVA 01/26/17, PMH: CAD, CABG 01/2002, DVT (Resolved)    Currently in Pain? No/denies           [facility] OT Assessment - 02/12/17 1151      Assessment   Diagnosis CVA   Referring Provider Helayne Seminole, PA-C   Onset Date 01/26/17   Prior Therapy no outpatient therapy     Precautions   Precautions Other (comment);Fall   Precaution Comments Rt LE DVT     Restrictions   Weight Bearing Restrictions No     Balance Screen   Has the patient fallen in the past 6 months --  See P.T. note     Home  Environment   Bathroom Building control surveyor;Door   Additional Comments Pt lives in 2 story home with 6 steps to enter. 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   Leisure Travel, drive for work     ADL   Eating/Feeding Independent   Grooming Independent   Multimedia programmer - Water engineer -  Development worker, community [place] care of all shopping needs independently   [place] light daily tasks such as dishwashing, bed making   Meal Prep --  Wife and pt share task, pt back to [place] own vehicle   Medication Management Is responsible for taking medication in correct dosages at correct time   Physiological scientist financial matters  independently (budgets, writes checks, pays rent, bills goes to bank), collects and keeps track of income     Mobility   Mobility Status Independent     Written Expression   Dominant Hand Right   Handwriting --  DENIES CHANGE     Vision - History   Baseline Vision Wears glasses all the time   Visual History Cataracts  Lt eye   Additional Comments diplopia resolved     Observation/Other Assessments   Observations spells "world" backwards w/o error, subtract by 7"s with extra time and self corrected error, delayed recall 3/3     Sensation   Light Touch Appears Intact     Coordination   9 Hole Peg Test Right;Left   Right 9 Hole Peg Test 21.69 sec   Left 9 Hole Peg Test 18.47 sec     Edema   Edema none in UE's     Tone   Assessment Location --  none     AROM   Overall AROM Comments BUE WNL's     Strength   Overall Strength Comments LUE MMT grossly 5/5, RUE MMT sh. flex, abd, and ER 4+/5, IR 5/5 (pt does report old biceps tear RUE that never was repaired, which possibly contributing to mild Rt sh. weakness)     Hand Function   Right Hand Grip (lbs) 80 lbs   Left Hand Grip (lbs) 75 lbs                              OT Long Term Goals - 02/12/17 1318      OT LONG TERM GOAL #1   Title Pt independent with HEP for Rt hand coordination and Rt shoulder strengthening   Time 4   Period Weeks   Status New     OT LONG TERM GOAL #2   Title Pt to improve coordination Rt hand by 3 sec. or greater on 9 hole peg test   Baseline eval = 21.69 sec   Time 4   Period Weeks   Status New               Plan - 02/12/17 1314    Clinical Impression Statement Pt is a 71 y.o. male who presents to outpatient rehab s/p CVA on 01/26/17. PMH: CABG x 1 (01/2002), DVT, CAD, HTN. Pt presents today with only mild coordination deficits and mild RUE weakness. Pt has returned to PLOF for most activities   Occupational Profile and client history currently impacting  functional performance Mild weakness/coordination RUE, previous h/o CABG and CAD which may  also limit endurance   Occupational performance deficits (Please refer to evaluation for details): Work;Leisure   Rehab Potential Excellent   OT Frequency 1x / week   OT Duration 4 weeks   OT Treatment/Interventions Therapeutic activities;Therapeutic exercises;Passive range of motion;Manual Therapy;DME and/or AE instruction;Patient/family education   Plan coordination and UE strengthening HEP [facility])   Clinical Decision Making Limited treatment options, no task modification necessary   Consulted and Agree with Plan of Care Patient      Patient will benefit from skilled therapeutic intervention in order to improve the following deficits and impairments:  Decreased strength, Impaired UE functional use, Decreased coordination  Visit Diagnosis: Other lack of coordination - Plan: Ot plan of care cert/re-cert  Muscle weakness (generalized) - Plan: Ot plan of care cert/re-cert      G-Codes - 12/82/08 1319    Functional Assessment Tool Used (Outpatient only) RUE: based on 9 hole peg test, MMT, and clinical judgement   Functional Limitation Carrying, moving and handling objects   Carrying, Moving and Handling Objects Current Status (H3887) At least 1 percent but less than 20 percent impaired, limited or restricted   Carrying, Moving and Handling Objects Goal Status (J9597) 0 percent impaired, limited or restricted      Problem List Patient Active Problem List   Diagnosis Date Noted  . Acute deep vein thrombosis (DVT) of femoral vein of right lower extremity ([place])   . PFO (patent foramen ovale)   . Stroke (cerebrum) ([place]) 01/26/2017  . BPH (benign prostatic hyperplasia) 01/26/2017  . Drug-induced bradycardia 08/01/2014  . CAD s/p CABG 08/06/2013  . Essential hypertension 08/06/2013  . Hyperlipidemia 08/06/2013    Carey Bullocks, OTR/L 02/12/2017, 1:23 PM  [facility] [address] [place], [place], [address] Phone: [phone]   Fax:  [phone]  Name: Jacob Hensley MRN: [id number] Date of Birth: Oct 06, 1945

## 2017-02-18 ENCOUNTER — Telehealth: Payer: Self-pay | Admitting: Neurology

## 2017-02-18 ENCOUNTER — Telehealth: Payer: Self-pay

## 2017-02-18 NOTE — Telephone Encounter (Addendum)
Rn call [facility] at [phone]. Rn stated the medication is requiring a tier exception. Pts member ID number is [id number]. The reference number for tier exception is PAU [id number].Rn spoke with rep, and a decision will be made in 3 to 5 days. The medication does not require a PA.

## 2017-02-18 NOTE — Telephone Encounter (Signed)
RN receive fax that tier exception was denied for patients eliquis.

## 2017-02-18 NOTE — Telephone Encounter (Signed)
Rn call patient, he is ready to return to work. The form will be fax to [phone]. The form is a one page that only wants return date, and a statement that pt has no restrictions. PT will not be charge for form.

## 2017-02-18 NOTE — Telephone Encounter (Signed)
Revised. 

## 2017-02-18 NOTE — Telephone Encounter (Signed)
Jacob Hensley from [employer] calling re: the reason the tier exception was not completed by Dr Erlinda Hong, Jacob Hensley said she obtained that information from [facility]. The request was for for financial  Assistance of the pt.  Please call [phone] and ref#[id number]

## 2017-02-18 NOTE — Telephone Encounter (Signed)
Rn call [place] with financial assistance. Rn stated patients medications were sent to a local pharmacy. He call to change the pharmacy to [facility]. [place] stated the pts insurance company needs to be call, and the tier exception can be done over the phone. Rn will call [facility].

## 2017-02-18 NOTE — Telephone Encounter (Signed)
RN call patient to find if he receive his eliquis rx. Pt stated he receive the medication on 02/17/2017. He receive a 90 day supply, and paid 90.00 dollars for it. RN stated his tier exception was denied for a lower co payment on the medication.Pt stated he knew the insurance would deny it. Pt is taking medication as prescribed, and will be on it long term for DVT.PT verbalized understanding.

## 2017-02-19 ENCOUNTER — Ambulatory Visit: Payer: Medicare Other

## 2017-02-19 ENCOUNTER — Ambulatory Visit: Payer: Medicare Other | Admitting: Occupational Therapy

## 2017-02-19 DIAGNOSIS — R42 Dizziness and giddiness: Secondary | ICD-10-CM | POA: Diagnosis not present

## 2017-02-19 DIAGNOSIS — R2689 Other abnormalities of gait and mobility: Secondary | ICD-10-CM

## 2017-02-19 DIAGNOSIS — M6281 Muscle weakness (generalized): Secondary | ICD-10-CM | POA: Diagnosis not present

## 2017-02-19 DIAGNOSIS — R278 Other lack of coordination: Secondary | ICD-10-CM

## 2017-02-19 NOTE — Patient Instructions (Signed)
  Coordination Activities  Perform the following activities for 10 minutes 1-2 times per day with right hand(s).   Rotate ball in fingertips (clockwise and counter-clockwise x 5 revolutions each way).  Deal cards with your thumb (Hold deck in hand and push card off top with thumb).  Pick up coins one at a time until you get 5 in your hand, then move coins from palm to fingertips to stack one at a time. Do 3 stacks of 5   Strengthening: Resisted Flexion   Hold tubing with __Rt___ arm(s) at side. Pull forward and up. Move shoulder through pain-free range of motion. Repeat __10__ times per set.  Do _1-2_ sessions per day , every other day   Strengthening: Resisted Extension   Hold tubing in __Rt___ hand(s), arm forward. Pull arm back, elbow straight. Repeat _10___ times per set. Do _1-2___ sessions per day, every other day.   Resisted Horizontal Abduction: Bilateral   Sit or stand, tubing in both hands, arms out in front. Keeping arms straight, pinch shoulder blades together and stretch arms out. Repeat _10___ times per set. Do _1-2___ sessions per day, every other day.   Elbow Flexion: Resisted   With tubing held in ___Rt___ hand(s) and other end secured under foot, curl arm up as far as possible. Repeat _10___ times per set. Do _1-2___ sessions per day, every other day.    Elbow Extension: Resisted   Sit in chair with resistive band secured at armrest (or hold with other hand) and ___RT____ elbow bent. Straighten elbow. Repeat _10___ times per set.  Do _1-2___ sessions per day, every other day.   Copyright  VHI. All rights reserved.

## 2017-02-19 NOTE — Telephone Encounter (Signed)
Rn call Melburn Hake in human resources at [phone] about fitness for duty form. Rn stated the form was filled out by Dr. Erlinda Hong that patient can return to work part time on 7/23/218. Beth stated she will be go to the fax machine to get fax. Rn fax form twice to [phone],and it was confirmed.

## 2017-02-19 NOTE — Therapy (Signed)
[facility] [address] [place], [place], [address] Phone: [phone]   Fax:  [phone]  Occupational Therapy Treatment  Patient Details  Name: Jacob Hensley MRN: [id number] Date of Birth: 01/12/1946 Referring Provider: Helayne Seminole, PA-C  Encounter Date: 02/19/2017      OT End of Session - 02/19/17 1326    Visit Number 2   Number of Visits 4   Date for OT Re-Evaluation 03/14/17   Authorization Type MCR - G code needed   Authorization - Visit Number 2   Authorization - Number of Visits 10   OT Start Time 1027   OT Stop Time 1315   OT Time Calculation (min) 45 min   Activity Tolerance Patient tolerated treatment well      Past Medical History:  Diagnosis Date  . BPH (benign prostatic hyperplasia)   . Coronary artery disease    CABG 2003-DR. CROITORU IS PT'S CARDIOLOGIST - LAST OFFICE VISIT 08/02/11-PT EXERCISES DAILY-NO C/O OF CHEST PAIN  . Diabetes mellitus    BORDERLINE - PT CHECKS HIS BLOOD SUGARS AT HOME - NO MEDS  . DVT of leg (deep venous thrombosis) ([place]) 2003   S/P CABG SURGERY  . Hyperlipidemia   . Hypertension   . Urine retention    PT STATES HE HAS BEEN DOING I&O CATHS FOR PAST 3 YRS    Past Surgical History:  Procedure Laterality Date  . CORONARY ARTERY BYPASS GRAFT  02/05/2002   LIMA to LAD,RIMA to PDA,SVG to diagonal & free left radial arter to the oblique marginal artery - Dr. Cyndia Bent  . CPET w/PFT  1//20/2014   Normal  . CYSTOSCOPY WITH BIOPSY  11/08/2011   Procedure: CYSTOSCOPY WITH BIOPSY;  Surgeon: Fredricka Bonine, MD;  Location: [facility];  Service: Urology;;  Bladder biopsies  . CYSTOSCOPY WITH BIOPSY  06/19/2012   Procedure: CYSTOSCOPY WITH BIOPSY;  Surgeon: Fredricka Bonine, MD;  Location: [facility];  Service: Urology;  Laterality: N/A;  fulgaration of bleeders  . myolema of back  2012  . TONSILLECTOMY    . TRANSURETHRAL RESECTION OF PROSTATE  11-08-2011  . US  ECHOCARDIOGRAPHY  04/14/2009   EF >50%,borderline dilated RV w/normal systolic fx,mildly dilated LA,mild AI,MR,TR.    There were no vitals filed for this visit.      Subjective Assessment - 02/19/17 1233    Pertinent History CVA 01/26/17, PMH: CAD, CABG 01/2002, DVT (Resolved)    Currently in Pain? 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 Pt performed each x 10 reps     Fine Motor Coordination   Other Fine Motor Exercises Pt issued coordination HEP - see pt instructions for details. Pt performed each                OT Education - 02/19/17 1253    Education provided Yes   Education Details Theraband HEP,  Coordination HEP, safety recommendations re: returning to commercial driving   Person(s) Educated Patient   Methods Explanation;Demonstration;Handout   Comprehension Verbalized understanding;Returned demonstration             OT Long Term Goals - 02/19/17 1327      OT LONG TERM GOAL #1   Title Pt independent with HEP for Rt hand coordination and Rt shoulder strengthening   Time 4   Period Weeks   Status On-going     OT LONG TERM GOAL #2   Title Pt to improve coordination Rt hand by 3 sec. or greater on 9 hole peg test   Baseline eval = 21.69 sec   Time 4   Period Weeks   Status New               Plan - 02/19/17 1327    Clinical Impression Statement Pt approximating goals.    Rehab Potential Excellent   OT Frequency 1x / week   OT Duration 4  weeks   OT Treatment/Interventions Therapeutic activities;Therapeutic exercises;Passive range of motion;Manual Therapy;DME and/or AE instruction;Patient/family education   Plan review HEP's, re-assess 9 hole peg test, and possible d/c next session. Also perform environmental scanning with physical task      Patient will benefit from skilled therapeutic intervention in order to improve the following deficits and impairments:  Decreased strength, Impaired UE functional use, Decreased coordination  Visit Diagnosis: Other lack of coordination  Muscle weakness (generalized)    Problem List Patient Active Problem List   Diagnosis Date Noted  . Acute deep vein thrombosis (DVT) of femoral vein of right lower extremity ([facility])   . PFO (patent foramen ovale)   . Stroke (cerebrum) ([facility]) 01/26/2017  . BPH (benign prostatic hyperplasia) 01/26/2017  . Drug-induced bradycardia 08/01/2014  . CAD s/p CABG 08/06/2013  . Essential hypertension 08/06/2013  . Hyperlipidemia 08/06/2013    Carey Bullocks, OTR/L 02/19/2017, 1:28 PM  [facility] [address] [place], [place], [address] Phone: [phone]   Fax:  [phone]  Name: Jacob Hensley MRN: [id number] Date of Birth: 23-Jul-1946

## 2017-02-19 NOTE — Patient Instructions (Addendum)
Backward    Walk backwards with eyes open. Take 10 even steps, making sure each foot lifts off floor. Repeat for __4__ laps per session. Do __1__ sessions per day. Repeat on compliant surface: ____grass next to wall of house____.  Copyright  VHI. All rights reserved.  Up / Down Head Motion    Perform without assistive device. Walking on solid surface, move head and eyes toward ceiling for __2__ steps. Then, move head and eyes toward floor for __2__ steps. Repeat __4__ times per session. Do __1__ sessions per day. Perform over grassy terrain, next to wall.  Copyright  VHI. All rights reserved.  Side to Side Head Motion    Perform without assistive device. Walking on solid surface, turn head and eyes to left for __2__ steps.  Then, turn head and eyes to opposite side for ___2_ steps. Repeat sequence __4__ times per session. Do __1__ sessions per day. Perform in grass, next to wall.  Copyright  VHI. All rights reserved.

## 2017-02-19 NOTE — Therapy (Signed)
[facility] [address] [place], [place], [address] Phone: [phone]   Fax:  [phone]  Physical Therapy Treatment  Patient Details  Name: Jacob Hensley MRN: [id number] Date of Birth: [birth year] Referring Provider: Dr. Allyson Sabal  Encounter Date: 02/19/2017      PT End of Session - 02/19/17 1233    Visit Number 2   Number of Visits 5   Date for PT Re-Evaluation 03/14/17   Authorization Type Medicare primary and [employer] secondary. G-CODE AND PN EVERY 10TH VISIT.    PT Start Time 1147   PT Stop Time 1230   PT Time Calculation (min) 43 min   Equipment Utilized During Treatment --  S prn   Activity Tolerance Patient tolerated treatment well   Behavior During Therapy WFL for tasks assessed/performed      Past Medical History:  Diagnosis Date  . BPH (benign prostatic hyperplasia)   . Coronary artery disease    CABG 2003-DR. CROITORU IS PT'S CARDIOLOGIST - LAST OFFICE VISIT 08/02/11-PT EXERCISES DAILY-NO C/O OF CHEST PAIN  . Diabetes mellitus    BORDERLINE - PT CHECKS HIS BLOOD SUGARS AT HOME - NO MEDS  . DVT of leg (deep venous thrombosis) ([facility] [facility]) 2003   S/P CABG SURGERY  . Hyperlipidemia   . Hypertension   . Urine retention    PT STATES HE HAS BEEN DOING I&O CATHS FOR PAST 3 YRS    Past Surgical History:  Procedure Laterality Date  . CORONARY ARTERY BYPASS GRAFT  02/05/2002   LIMA to LAD,RIMA to PDA,SVG to diagonal & free left radial arter to the oblique marginal artery - Dr. Cyndia Bent  . CPET w/PFT  1//20/2014   Normal  . CYSTOSCOPY WITH BIOPSY  11/08/2011   Procedure: CYSTOSCOPY WITH BIOPSY;  Surgeon: Fredricka Bonine, MD;  Location: [facility];  Service: Urology;;  Bladder biopsies  . CYSTOSCOPY WITH BIOPSY  06/19/2012   Procedure: CYSTOSCOPY WITH BIOPSY;  Surgeon: Fredricka Bonine, MD;  Location: [facility];  Service: Urology;  Laterality: N/A;  fulgaration of bleeders  . myolema of back  2012   . TONSILLECTOMY    . TRANSURETHRAL RESECTION OF PROSTATE  11-08-2011  . US ECHOCARDIOGRAPHY  04/14/2009   EF >50%,borderline dilated RV w/normal systolic fx,mildly dilated LA,mild AI,MR,TR.    There were no vitals filed for this visit.      Subjective Assessment - 02/19/17 1150    Subjective Pt reported he's already walked 11,000' today (he walked 5 miles). Pt spoke to Dr. Phoebe Sharps nurse yesterday, regarding residual "funny feeling in head" s/p CVA and the nurse told pt it's normal to experience residual side effects s/p CVA. Pt reported Dr. Erlinda Hong cleared pt for driving.  Pt reported his BP has been low when he takes it at home.    Pertinent History CAD s/p CABG, hx R LE DVT, DM (borderline), hyperlipidemia, HTN   Patient Stated Goals "I'm not doing anything different now, after the stroke."   Currently in Pain? No/denies                Vestibular Assessment - 02/19/17 1152      Vestibular Assessment   General Observation Pt reported after PT session, he performed dynamic gait activities while walking and reported he did fine.      Symptom Behavior   Type of Dizziness "Funny feeling in head"   Frequency of Dizziness Every once in a while, a few days a week.  Duration of Dizziness Pt not sure, but states "maybe a couple hours"   Aggravating Factors No known aggravating factors   Relieving Factors No known relieving factors     Occulomotor Exam   Occulomotor Alignment Normal   Spontaneous Absent   Gaze-induced Absent   Smooth Pursuits Intact   Saccades Intact   Comment Pt denied dizziness during occulomotor exam. B HIT (-).     Vestibulo-Occular Reflex   VOR 1 Head Only (x 1 viewing) WNL and no c/o dizziness.   VOR Cancellation Normal     Positional Testing   Dix-Hallpike --  not performed 2/2 symptoms not consistent with BPPV     Orthostatics   BP supine (x 5 minutes) 134/76   HR supine (x 5 minutes) 54   BP sitting 126/84   HR sitting 56   BP standing (after 1  minute) 134/76   HR standing (after 1 minute) 59   Orthostatics Comment No denied dizziness during supine to sit and sit to stand.                  [facility] Adult PT Treatment/Exercise - 02/19/17 1235      High Level Balance   High Level Balance Activities Backward walking;Head turns   High Level Balance Comments Performed 200' over uneven terrain, sidewalk and grass: cues for instructions, please see pt instructions for HEP details.                 PT Education - 02/19/17 1233    Education provided Yes   Education Details PT discussed exam findings and provided pt with balance HEP. PT discussed potential d/c next session 2/2 great progress.   Person(s) Educated Patient   Methods Explanation;Demonstration;Verbal cues;Handout   Comprehension Verbalized understanding;Returned demonstration          PT Short Term Goals - 02/12/17 1150      PT SHORT TERM GOAL #1   Title same as LTGs           PT Long Term Goals - 02/12/17 1150      PT LONG TERM GOAL #1   Title Pt will be IND with HEP to improve balance, posture, and unsteadiness. TARGET DATE: 03/12/17   Status New     PT LONG TERM GOAL #2   Title Pt will improve FGA score to 30/30 to decr. falls risk.   Status New     PT LONG TERM GOAL #3   Title Pt will amb. IND 1000', over even/uneven terrain, while performing intermittent head turns to improve functional mobility.    Status New               Plan - 02/19/17 1233    Clinical Impression Statement Pt's vestibular exam negative for provoking dizziness and negative for orthostasis. Pt is demonstrating progress, as he's able to perform high level balance activities over uneven terrain with S. Pt's intermittent unsteadiness, likely 2/2 decr. Muscle reaction time vs. Vestibular impairments. Pt does decr. speed during head turns and backwards amb. to ensure safety. PT will assess goals next session and likely d/c pt.    Rehab Potential Good   Clinical  Impairments Affecting Rehab Potential see above   PT Frequency 1x / week   PT Duration 4 weeks   PT Treatment/Interventions ADLs/Self Care Home Management;Biofeedback;Canalith Repostioning;Neuromuscular re-education;Balance training;Therapeutic exercise;Therapeutic activities;Manual techniques;Functional mobility training;Stair training;Gait training;DME Instruction;Patient/family education;Vestibular   PT Next Visit Plan Check goals and potential d/c.    Consulted  and Agree with Plan of Care Patient      Patient will benefit from skilled therapeutic intervention in order to improve the following deficits and impairments:  Abnormal gait, Decreased balance, Dizziness, Postural dysfunction  Visit Diagnosis: Other abnormalities of gait and mobility  Dizziness and giddiness     Problem List Patient Active Problem List   Diagnosis Date Noted  . Acute deep vein thrombosis (DVT) of femoral vein of right lower extremity ([facility])   . PFO (patent foramen ovale)   . Stroke (cerebrum) ([place] [facility]) 01/26/2017  . BPH (benign prostatic hyperplasia) 01/26/2017  . Drug-induced bradycardia 08/01/2014  . CAD s/p CABG 08/06/2013  . Essential hypertension 08/06/2013  . Hyperlipidemia 08/06/2013    Miller,Jennifer L 02/19/2017, 12:36 PM  [facility] [address] [place], [place], [address] Phone: [phone]   Fax:  [phone]  Name: JANCARLO BIERMANN MRN: [id number] Date of Birth: 1945/12/05  Geoffry Paradise, PT,DPT 02/19/17 12:36 PM Phone: [phone] Fax: [phone]

## 2017-02-25 ENCOUNTER — Ambulatory Visit: Payer: Medicare Other | Admitting: Occupational Therapy

## 2017-02-25 DIAGNOSIS — R278 Other lack of coordination: Secondary | ICD-10-CM | POA: Diagnosis not present

## 2017-02-25 DIAGNOSIS — R42 Dizziness and giddiness: Secondary | ICD-10-CM | POA: Diagnosis not present

## 2017-02-25 DIAGNOSIS — M6281 Muscle weakness (generalized): Secondary | ICD-10-CM

## 2017-02-25 DIAGNOSIS — R2689 Other abnormalities of gait and mobility: Secondary | ICD-10-CM | POA: Diagnosis not present

## 2017-02-25 NOTE — Therapy (Signed)
[facility] [address] [place], [place], [address] Phone: [phone]   Fax:  [phone]  Occupational Therapy Treatment  Patient Details  Name: Jacob Hensley MRN: [id number] Date of Birth: 1945-10-29 Referring Provider: Helayne Seminole, PA-C  Encounter Date: 02/25/2017      OT End of Session - 02/25/17 1046    Visit Number 3   Number of Visits 4   Date for OT Re-Evaluation 03/14/17   Authorization Type MCR - G code needed   Authorization - Visit Number 3   Authorization - Number of Visits 10   OT Start Time 0100   OT Stop Time 1100   OT Time Calculation (min) 45 min   Activity Tolerance Patient tolerated treatment well      Past Medical History:  Diagnosis Date  . BPH (benign prostatic hyperplasia)   . Coronary artery disease    CABG 2003-DR. CROITORU IS PT'S CARDIOLOGIST - LAST OFFICE VISIT 08/02/11-PT EXERCISES DAILY-NO C/O OF CHEST PAIN  . Diabetes mellitus    BORDERLINE - PT CHECKS HIS BLOOD SUGARS AT HOME - NO MEDS  . DVT of leg (deep venous thrombosis) ([facility]) 2003   S/P CABG SURGERY  . Hyperlipidemia   . Hypertension   . Urine retention    PT STATES HE HAS BEEN DOING I&O CATHS FOR PAST 3 YRS    Past Surgical History:  Procedure Laterality Date  . CORONARY ARTERY BYPASS GRAFT  02/05/2002   LIMA to LAD,RIMA to PDA,SVG to diagonal & free left radial arter to the oblique marginal artery - Dr. Cyndia Bent  . CPET w/PFT  1//20/2014   Normal  . CYSTOSCOPY WITH BIOPSY  11/08/2011   Procedure: CYSTOSCOPY WITH BIOPSY;  Surgeon: Fredricka Bonine, MD;  Location: [facility];  Service: Urology;;  Bladder biopsies  . CYSTOSCOPY WITH BIOPSY  06/19/2012   Procedure: CYSTOSCOPY WITH BIOPSY;  Surgeon: Fredricka Bonine, MD;  Location: [facility];  Service: Urology;  Laterality: N/A;  fulgaration of bleeders  . myolema of back  2012  . TONSILLECTOMY    . TRANSURETHRAL RESECTION OF PROSTATE  11-08-2011  . US  ECHOCARDIOGRAPHY  04/14/2009   EF >50%,borderline dilated RV w/normal systolic fx,mildly dilated LA,mild AI,MR,TR.    There were no vitals filed for this visit.      Subjective Assessment - 02/25/17 1017    Subjective  Even my wife couldn't do the penny ex's   Pertinent History CVA 01/26/17, PMH: CAD, CABG 01/2002, DVT (Resolved)    Currently in Pain? No/denies            [facility] OT Assessment - 02/25/17 0001      Coordination   Right 9 Hole Peg Test 19.84 sec                  OT Treatments/Exercises (OP) - 02/25/17 0001      Shoulder Exercises: ROM/Strengthening   UBE (Upper Arm Bike) x 8 min. Level 5 for UE strength/endurance   Other ROM/Strengthening Exercises Reviewed theraband HEP - pt demo each x 15 reps     Fine Motor Coordination   Other Fine Motor Exercises Reviewed coordination HEP - pt demo each     Visual/Perceptual Exercises   Scanning Environmental   Scanning - Environmental Pt missed first 3 cards however wasn't expecting them to be hanging and was looking on countertop, Once pt realized cards were hanging, pt found all w/o difficulty while ambulating and tossing ball in Rt  hand, then Lt hand                     OT Long Term Goals - 03-18-17 1047      OT LONG TERM GOAL #1   Title Pt independent with HEP for Rt hand coordination and Rt shoulder strengthening   Time 4   Period Weeks   Status Achieved     OT LONG TERM GOAL #2   Title Pt to improve coordination Rt hand by 3 sec. or greater on 9 hole peg test   Baseline eval = 21.69 sec   Time 4   Period Weeks   Status Partially Met  improved by 2 sec.                Plan - 18-Mar-2017 1048    Clinical Impression Statement Pt met LTG #1 and only off by 1 sec. in reaching LTG #2.    Rehab Potential Excellent   OT Treatment/Interventions Therapeutic activities;Therapeutic exercises;Passive range of motion;Manual Therapy;DME and/or AE instruction;Patient/family education   Plan  D/C O.Darene Lamer    Consulted and Agree with Plan of Care Patient      Patient will benefit from skilled therapeutic intervention in order to improve the following deficits and impairments:  Decreased strength, Impaired UE functional use, Decreased coordination  Visit Diagnosis: Muscle weakness (generalized)  Other lack of coordination      G-Codes - March 18, 2017 1049    Functional Assessment Tool Used (Outpatient only) RUE: based on 9 hole peg test, MMT, and clinical judgement   Functional Limitation Carrying, moving and handling objects   Carrying, Moving and Handling Objects Goal Status ([id number]) 0 percent impaired, limited or restricted   Carrying, Moving and Handling Objects Discharge Status [phone]) 0 percent impaired, limited or restricted      Problem List Patient Active Problem List   Diagnosis Date Noted  . Acute deep vein thrombosis (DVT) of femoral vein of right lower extremity ([facility] [facility])   . PFO (patent foramen ovale)   . Stroke (cerebrum) ([facility]) 01/26/2017  . BPH (benign prostatic hyperplasia) 01/26/2017  . Drug-induced bradycardia 08/01/2014  . CAD s/p CABG 08/06/2013  . Essential hypertension 08/06/2013  . Hyperlipidemia 08/06/2013   OCCUPATIONAL THERAPY DISCHARGE SUMMARY  Visits from Start of Care: 3  Current functional level related to goals / functional outcomes: See above   Remaining deficits: none   Education / Equipment: Pt was provided with HEP's and safety recommendations for driving.   Plan: Patient agrees to discharge.  Patient goals were met. Patient is being discharged due to meeting the stated rehab goals.  ?????        Carey Bullocks, OTR/L 03-18-17, 11:01 AM  [facility] [address] [place], [place], [address] Phone: [phone]   Fax:  [phone]  Name: Jacob Hensley MRN: [id number] Date of Birth: [birth year]

## 2017-02-27 DIAGNOSIS — N400 Enlarged prostate without lower urinary tract symptoms: Secondary | ICD-10-CM | POA: Diagnosis not present

## 2017-02-28 ENCOUNTER — Ambulatory Visit: Payer: Medicare Other | Attending: Internal Medicine

## 2017-02-28 DIAGNOSIS — R2689 Other abnormalities of gait and mobility: Secondary | ICD-10-CM | POA: Diagnosis not present

## 2017-02-28 NOTE — Therapy (Signed)
[facility] [address] [place], [place], [address] Phone: [phone]   Fax:  [phone]  Physical Therapy Treatment  Patient Details  Name: Jacob Hensley MRN: [id number] Date of Birth: 04/09/1946 Referring Provider: Dr. Allyson Sabal  Encounter Date: 02/28/2017      PT End of Session - 02/28/17 1133    Visit Number 3   Number of Visits 5   Date for PT Re-Evaluation 03/14/17   Authorization Type Medicare primary and [place] [employer] secondary. G-CODE AND PN EVERY 10TH VISIT.    PT Start Time 1102   PT Stop Time 1131   PT Time Calculation (min) 29 min   Equipment Utilized During Treatment --  min guard PRN   Activity Tolerance Patient tolerated treatment well   Behavior During Therapy WFL for tasks assessed/performed      Past Medical History:  Diagnosis Date  . BPH (benign prostatic hyperplasia)   . Coronary artery disease    CABG 2003-DR. CROITORU IS PT'S CARDIOLOGIST - LAST OFFICE VISIT 08/02/11-PT EXERCISES DAILY-NO C/O OF CHEST PAIN  . Diabetes mellitus    BORDERLINE - PT CHECKS HIS BLOOD SUGARS AT HOME - NO MEDS  . DVT of leg (deep venous thrombosis) ([place]) 2003   S/P CABG SURGERY  . Hyperlipidemia   . Hypertension   . Urine retention    PT STATES HE HAS BEEN DOING I&O CATHS FOR PAST 3 YRS    Past Surgical History:  Procedure Laterality Date  . CORONARY ARTERY BYPASS GRAFT  02/05/2002   LIMA to LAD,RIMA to PDA,SVG to diagonal & free left radial arter to the oblique marginal artery - Dr. Cyndia Bent  . CPET w/PFT  1//20/2014   Normal  . CYSTOSCOPY WITH BIOPSY  11/08/2011   Procedure: CYSTOSCOPY WITH BIOPSY;  Surgeon: Fredricka Bonine, MD;  Location: [facility];  Service: Urology;;  Bladder biopsies  . CYSTOSCOPY WITH BIOPSY  06/19/2012   Procedure: CYSTOSCOPY WITH BIOPSY;  Surgeon: Fredricka Bonine, MD;  Location: [facility];  Service: Urology;  Laterality: N/A;  fulgaration of bleeders  . myolema of  back  2012  . TONSILLECTOMY    . TRANSURETHRAL RESECTION OF PROSTATE  11-08-2011  . US ECHOCARDIOGRAPHY  04/14/2009   EF >50%,borderline dilated RV w/normal systolic fx,mildly dilated LA,mild AI,MR,TR.    There were no vitals filed for this visit.      Subjective Assessment - 02/28/17 1104    Subjective Pt denied changes or falls since last visit. Pt able walk through the woods and over gravel without LOB. Pt reports BP has been good. Pt reports "funny feeling in head" is better.    Pertinent History CAD s/p CABG, hx R LE DVT, DM (borderline), hyperlipidemia, HTN   Patient Stated Goals "I'm not doing anything different now, after the stroke."   Currently in Pain? No/denies            [facility] PT Assessment - 02/28/17 1112      Functional Gait  Assessment   Gait assessed  Yes   Gait Level Surface Walks 20 ft in less than 5.5 sec, no assistive devices, good speed, no evidence for imbalance, normal gait pattern, deviates no more than 6 in outside of the 12 in walkway width.   Change in Gait Speed Able to smoothly change walking speed without loss of balance or gait deviation. Deviate no more than 6 in outside of the 12 in walkway width.   Gait with Horizontal Head Turns  Performs head turns smoothly with slight change in gait velocity (eg, minor disruption to smooth gait path), deviates 6-10 in outside 12 in walkway width, or uses an assistive device.   Gait with Vertical Head Turns Performs head turns with no change in gait. Deviates no more than 6 in outside 12 in walkway width.   Gait and Pivot Turn Pivot turns safely within 3 sec and stops quickly with no loss of balance.   Step Over Obstacle Is able to step over 2 stacked shoe boxes taped together (9 in total height) without changing gait speed. No evidence of imbalance.   Gait with Narrow Base of Support Is able to ambulate for 10 steps heel to toe with no staggering.   Gait with Eyes Closed Walks 20 ft, no assistive devices, good  speed, no evidence of imbalance, normal gait pattern, deviates no more than 6 in outside 12 in walkway width. Ambulates 20 ft in less than 7 sec.   Ambulating Backwards Walks 20 ft, no assistive devices, good speed, no evidence for imbalance, normal gait   Steps Alternating feet, no rail.   Total Score 29   FGA comment: 29/30                     [facility] Adult PT Treatment/Exercise - 02/28/17 1121      Ambulation/Gait   Ambulation/Gait Yes   Ambulation/Gait Assistance 7: Independent  no LOB, while performing head turns over uneven terrain.   Ambulation Distance (Feet) 1000 Feet   Assistive device None   Gait Pattern Within Functional Limits   Ambulation Surface Level;Unlevel;Indoor;Outdoor;Paved;Grass                PT Education - 02/28/17 1132    Education provided Yes   Education Details PT discussed goals and outcome measure results. PT discussed d/c due to great progress. PT reviewed balance HEP with pt and educated pt he can reduce frequency to 2x/week vs. daily.   Person(s) Educated Patient   Methods Explanation   Comprehension Verbalized understanding          PT Short Term Goals - 02/12/17 1150      PT SHORT TERM GOAL #1   Title same as LTGs           PT Long Term Goals - 02/28/17 1134      PT LONG TERM GOAL #1   Title Pt will be IND with HEP to improve balance, posture, and unsteadiness. TARGET DATE: 03/12/17   Status Achieved     PT LONG TERM GOAL #2   Title Pt will improve FGA score to 30/30 to decr. falls risk.   Status Partially Met     PT LONG TERM GOAL #3   Title Pt will amb. IND 1000', over even/uneven terrain, while performing intermittent head turns to improve functional mobility.    Status Achieved               Plan - 02/28/17 1133    Clinical Impression Statement Pt met LTGs 1 and 3, pt partially met FGA score (he scored 29/30 vs. goal of 30/30) but FGA score indicates pt is not at risk for falls. Pt demonstrated  excellent progress and is discharged from PT. Please see pt instructions for details.    Rehab Potential Good   Clinical Impairments Affecting Rehab Potential see above   PT Frequency 1x / week   PT Duration 4 weeks   PT Treatment/Interventions ADLs/Self Care Home  Management;Biofeedback;Canalith Repostioning;Neuromuscular re-education;Balance training;Therapeutic exercise;Therapeutic activities;Manual techniques;Functional mobility training;Stair training;Gait training;DME Instruction;Patient/family education;Vestibular   Consulted and Agree with Plan of Care Patient      Patient will benefit from skilled therapeutic intervention in order to improve the following deficits and impairments:  Abnormal gait, Decreased balance, Dizziness, Postural dysfunction  Visit Diagnosis: Other abnormalities of gait and mobility       G-Codes - Mar 16, 2017 1137    Functional Assessment Tool Used (Outpatient Only) FGA: 29/30   Functional Limitation Mobility: Walking and moving around   Mobility: Walking and Moving Around Goal Status [phone]) At least 1 percent but less than 20 percent impaired, limited or restricted   Mobility: Walking and Moving Around Discharge Status [phone]) At least 1 percent but less than 20 percent impaired, limited or restricted      Problem List Patient Active Problem List   Diagnosis Date Noted  . Acute deep vein thrombosis (DVT) of femoral vein of right lower extremity ([place])   . PFO (patent foramen ovale)   . Stroke (cerebrum) ([place]) 01/26/2017  . BPH (benign prostatic hyperplasia) 01/26/2017  . Drug-induced bradycardia 08/01/2014  . CAD s/p CABG 08/06/2013  . Essential hypertension 08/06/2013  . Hyperlipidemia 08/06/2013    Tavi Gaughran L March 16, 2017, 11:37 AM  [facility] [address] [place], [place], [address] Phone: [phone]   Fax:  [phone]  Name: Jacob Hensley MRN: [id number] Date of  Birth: 12-Jan-1946  PHYSICAL THERAPY DISCHARGE SUMMARY  Visits from Start of Care: 3  Current functional level related to goals / functional outcomes:     PT Long Term Goals - Mar 16, 2017 1134      PT LONG TERM GOAL #1   Title Pt will be IND with HEP to improve balance, posture, and unsteadiness. TARGET DATE: 03/12/17   Status Achieved     PT LONG TERM GOAL #2   Title Pt will improve FGA score to 30/30 to decr. falls risk.   Status Partially Met     PT LONG TERM GOAL #3   Title Pt will amb. IND 1000', over even/uneven terrain, while performing intermittent head turns to improve functional mobility.    Status Achieved        Remaining deficits: None   Education / Equipment: HEP  Plan: Patient agrees to discharge.  Patient goals were met. Patient is being discharged due to meeting the stated rehab goals.  ?????         Geoffry Paradise, PT,DPT 2017/03/16 11:37 AM Phone: [phone] Fax: [phone]

## 2017-03-03 ENCOUNTER — Encounter: Payer: Self-pay | Admitting: Neurology

## 2017-03-03 ENCOUNTER — Ambulatory Visit (INDEPENDENT_AMBULATORY_CARE_PROVIDER_SITE_OTHER): Payer: Medicare Other | Admitting: Neurology

## 2017-03-03 VITALS — BP 151/88 | HR 57 | Ht 69.0 in | Wt 182.0 lb

## 2017-03-03 DIAGNOSIS — Q2112 Patent foramen ovale: Secondary | ICD-10-CM

## 2017-03-03 DIAGNOSIS — Q211 Atrial septal defect: Secondary | ICD-10-CM

## 2017-03-03 DIAGNOSIS — E781 Pure hyperglyceridemia: Secondary | ICD-10-CM | POA: Insufficient documentation

## 2017-03-03 DIAGNOSIS — I2510 Atherosclerotic heart disease of native coronary artery without angina pectoris: Secondary | ICD-10-CM | POA: Diagnosis not present

## 2017-03-03 DIAGNOSIS — I639 Cerebral infarction, unspecified: Secondary | ICD-10-CM | POA: Diagnosis not present

## 2017-03-03 DIAGNOSIS — I82409 Acute embolism and thrombosis of unspecified deep veins of unspecified lower extremity: Secondary | ICD-10-CM | POA: Diagnosis not present

## 2017-03-03 DIAGNOSIS — I10 Essential (primary) hypertension: Secondary | ICD-10-CM

## 2017-03-03 DIAGNOSIS — E1159 Type 2 diabetes mellitus with other circulatory complications: Secondary | ICD-10-CM | POA: Diagnosis not present

## 2017-03-03 DIAGNOSIS — I6302 Cerebral infarction due to thrombosis of basilar artery: Secondary | ICD-10-CM | POA: Diagnosis not present

## 2017-03-03 DIAGNOSIS — E119 Type 2 diabetes mellitus without complications: Secondary | ICD-10-CM | POA: Insufficient documentation

## 2017-03-03 NOTE — Progress Notes (Signed)
STROKE NEUROLOGY FOLLOW UP NOTE  NAME: JOHNNEY SCARLATA DOB: June 21, 1946  REASON FOR VISIT: stroke follow up HISTORY FROM: pt and chart  Today we had the pleasure of seeing JERET GOYER in follow-up at our Neurology Clinic. Pt was accompanied by no one.   History Summary Mr. JAKAREE PICKARD is a 71 y.o. male with history of  hypertension, hyperlipidemia, diet-controlled diabetes, CAD s/p CABG, remote DVT post CABG procedure admitted on 01/26/17 for dizziness, slurred speech and gait instability. MRI showed small acute linear infarct in the right pons. MRA, CUS, TTE all unremarkable. However, he was found to have right LE subacute DVT and spencer degree II PFO with valsalva maneuver. LDL 72 and A1C 6.7. 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 Due to his recurrent DVTs with PFO, he was put on eliquis for DVT and stroke prevention. Also continued ASA for cardiac prevention. crestor also continued. He was discharged in good condition.   Interval History During the interval time, the patient has been doing well.  He is very active, walking and swimming. His glucose in good control. BP fluctuates at home, but still within the goal. 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 He is on eliquis and ASA without bleeding. Bp today 151/88. He has not take his BP meds today.   REVIEW OF SYSTEMS: Full 14 system review of systems performed and notable only for those listed below and in HPI above, all others are negative:  Constitutional:   Cardiovascular:  Ear/Nose/Throat:   Skin:  Eyes:   Respiratory:   Gastroitestinal:   Genitourinary:  Hematology/Lymphatic:   Endocrine:  Musculoskeletal:  Joint pain Allergy/Immunology:   Neurological:   Psychiatric:  Sleep:   The following represents the patient's updated allergies and side effects list: No Known Allergies  The neurologically  relevant items on the patient's problem list were reviewed on today's visit.  Neurologic Examination  A problem focused neurological exam (12 or more points of the single system neurologic examination, vital signs counts as 1 point, cranial nerves count for 8 points) was performed.  Blood pressure (!) 151/88, pulse (!) 57, height 5\' 9"  (1.753 m), weight 182 lb (82.6 kg).  General - Well nourished, well developed, in no apparent distress.  Ophthalmologic - Sharp disc margins OU.   Cardiovascular - Regular rate and rhythm with no murmur.  Mental Status -  Level of arousal and orientation to time, place, and person were intact. Language including expression, naming, repetition, comprehension was assessed and found intact. Fund of Knowledge was assessed and was intact.  Cranial Nerves II - XII - II - Visual field intact OU. III, IV, VI - Extraocular movements intact. V - Facial sensation intact bilaterally. VII - Facial movement intact bilaterally. VIII - Hearing & vestibular intact bilaterally. X - Palate elevates symmetrically. XI - Chin turning & shoulder shrug intact bilaterally. XII - Tongue protrusion intact  Motor Strength - The patient's strength was normal in all extremities and pronator drift was absent.  Bulk was normal and fasciculations were absent.   Motor Tone - Muscle tone was assessed at the neck and appendages and was normal.  Reflexes - The patient's reflexes were 1+ in all extremities and he had no pathological reflexes.  Sensory - Light touch, temperature/pinprick were assessed and were normal.    Coordination - The patient had  normal movements in the hands and feet with no ataxia or dysmetria.  Tremor was absent.  Gait and Station - The patient's transfers, posture, gait, station, and turns were observed as normal.   Functional score  mRS = 0   0 - No symptoms.   1 - No significant disability. Able to carry out all usual activities, despite some  symptoms.   2 - Slight disability. Able to look after own affairs without assistance, but unable to carry out all previous activities.   3 - Moderate disability. Requires some help, but able to walk unassisted.   4 - Moderately severe disability. Unable to attend to own bodily needs without assistance, and unable to walk unassisted.   5 - Severe disability. Requires constant nursing care and attention, bedridden, incontinent.   6 - Dead.   NIH Stroke Scale = 0   Data reviewed: I personally reviewed the images and agree with the radiology interpretations.  Ct Head Wo Contrast 01/26/2017 IMPRESSION: Normal for age non contrast CT appearance of the brain.  [name] Head Wo Contrast 01/27/2017 IMPRESSION: No large vessel occlusion, aneurysm, or significant stenosis is identified. Unremarkable MRA of the head.  Mr Brain Wo Contrast (neuro Protocol) 01/26/2017 IMPRESSION: 1. Small acute linear infarct in the right pons, Basilar Artery perforator branch territory. No associated hemorrhage or mass effect. 2. Small chronic lacunar infarct in the medial right thalamus.  CUS - Bilateral: 1-39% ICA stenosis. Vertebral artery flow is antegrade.  TTE - Left ventricle: The cavity size was normal. Wall thickness was increased in a pattern of mild LVH. Systolic function was normal. The estimated ejection fraction was in the range of 60% to 65%. Doppler parameters are consistent with abnormal left ventricular relaxation (grade 1 diastolic dysfunction). - Aortic valve: There was mild regurgitation.  LE venous Doppler Right lower extremity is positivefor subacute versus age indeterminatedeep vein thrombosis involving the distal right femoral vein, right popliteal vein, right posterior tibial veins, right peroneal veins, and right gastrocnemius veins. There is no evidence of left lower extremity deep vein thrombosis orBaker's cyst bilaterally  TCD bubble study - High intensity transient  signals (HITS) (17) were heard with Valsalva, suggestive of a small patent foramen ovale (PFO) with spencer degree II.  Component     Latest Ref Rng & Units 01/27/2017  Cholesterol     0 - 200 mg/dL 142  Triglycerides     <150 mg/dL 153 (H)  HDL Cholesterol     >40 mg/dL 39 (L)  Total CHOL/HDL Ratio     RATIO 3.6  VLDL     0 - 40 mg/dL 31  LDL (calc)     0 - 99 mg/dL 72  Hemoglobin A1C     4.8 - 5.6 % 6.7 (H)  Mean Plasma Glucose     mg/dL 146     Assessment: As you may recall, he is a 71 y.o. Caucasian male with PMH of hypertension, hyperlipidemia, diet-controlled diabetes, CAD s/p CABG, remote DVT post CABG procedure admitted on 01/26/17 for small acute linear infarct in the right pons. MRA, CUS, TTE all unremarkable. However, he was found to have right LE subacute DVT and spencer degree II PFO with valsalva maneuver. LDL 72 and A1C 6.7. 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 Although the stroke felt likely due to small vessel disease, due to his recurrent DVTs with PFO, he was put on eliquis for DVT and stroke prevention. Also  continued ASA for cardiac prevention. Continued Crestor also. He has been doing well since discharge. I do not have driving restrictions for him from stroke standpoint at this time, but pt understand that his company may have further restrictions for his driving due to stroke.   Plan:  - continue eliquis and ASA and crestor for stroke and cardiac prevention - check BP at home and record, ideally at the same time of the day. - check glucose at home - Follow up with your primary care physician for stroke risk factor modification. Recommend maintain blood pressure goal <130/80, diabetes with hemoglobin A1c goal below 7.0% and lipids with LDL cholesterol goal below 70 mg/dL.  - No restriction for driving at this time from stroke standpoint, but he understood that it will up to his company policy.  - healthy diet and regular  exercise. - follow up in 6 months with me  No orders of the defined types were placed in this encounter.   No orders of the defined types were placed in this encounter.   Patient Instructions  - continue eliquis and ASA for stroke and cardiac prevention - check BP at home and record, ideally at the same time of the day. - check glucose at home - Follow up with your primary care physician for stroke risk factor modification. Recommend maintain blood pressure goal <130/80, diabetes with hemoglobin A1c goal below 7.0% and lipids with LDL cholesterol goal below 70 mg/dL.  - No restriction for driving at this time from stroke standpoint, but [place] with your company policy.  - healthy diet and regular exercise. - follow up in 6 months with me     Rosalin Hawking, MD PhD [facility] [address] [place], [place] [address] [phone]

## 2017-03-03 NOTE — Patient Instructions (Addendum)
-   continue eliquis and ASA for stroke and cardiac prevention - check BP at home and record, ideally at the same time of the day. - check glucose at home - Follow up with your primary care physician for stroke risk factor modification. Recommend maintain blood pressure goal <130/80, diabetes with hemoglobin A1c goal below 7.0% and lipids with LDL cholesterol goal below 70 mg/dL.  - No restriction for driving at this time from stroke standpoint, but [place] with your company policy.  - healthy diet and regular exercise. - follow up in 6 months with me

## 2017-03-04 ENCOUNTER — Ambulatory Visit: Payer: Medicare Other

## 2017-03-05 ENCOUNTER — Telehealth: Payer: Self-pay

## 2017-03-05 NOTE — Telephone Encounter (Signed)
Rn call patient that his office notes will be fax to [facility] urgent care. Pt verbalized understanding. It will be to attention Dr. Owens Shark.

## 2017-03-05 NOTE — Telephone Encounter (Signed)
Rn call [facility] urgent care at [phone]. Rn verified fax number is [phone]. The staff stated fax office notes to attention Dr. Owens Shark, and the will be given to him. Rn fax office notes to place that does his physicals for his job. Notes fax twice, and receive.

## 2017-03-11 ENCOUNTER — Ambulatory Visit: Payer: Medicare Other

## 2017-04-02 ENCOUNTER — Ambulatory Visit: Payer: Self-pay | Admitting: Neurology

## 2017-04-30 ENCOUNTER — Ambulatory Visit
Admission: RE | Admit: 2017-04-30 | Discharge: 2017-04-30 | Disposition: A | Discharge status: Home / Self Care | Payer: Medicare Other | Source: Ambulatory Visit | Attending: Internal Medicine | Admitting: Internal Medicine

## 2017-04-30 ENCOUNTER — Other Ambulatory Visit: Payer: Self-pay | Admitting: Internal Medicine

## 2017-04-30 DIAGNOSIS — Z9181 History of falling: Secondary | ICD-10-CM | POA: Diagnosis not present

## 2017-04-30 DIAGNOSIS — Z125 Encounter for screening for malignant neoplasm of prostate: Secondary | ICD-10-CM | POA: Diagnosis not present

## 2017-04-30 DIAGNOSIS — I10 Essential (primary) hypertension: Secondary | ICD-10-CM | POA: Diagnosis not present

## 2017-04-30 DIAGNOSIS — T148XXA Other injury of unspecified body region, initial encounter: Secondary | ICD-10-CM | POA: Diagnosis not present

## 2017-04-30 DIAGNOSIS — E1122 Type 2 diabetes mellitus with diabetic chronic kidney disease: Secondary | ICD-10-CM | POA: Diagnosis not present

## 2017-04-30 DIAGNOSIS — Z23 Encounter for immunization: Secondary | ICD-10-CM | POA: Diagnosis not present

## 2017-04-30 DIAGNOSIS — S0093XA Contusion of unspecified part of head, initial encounter: Secondary | ICD-10-CM | POA: Diagnosis not present

## 2017-04-30 DIAGNOSIS — Z7901 Long term (current) use of anticoagulants: Secondary | ICD-10-CM | POA: Diagnosis not present

## 2017-04-30 DIAGNOSIS — S0990XA Unspecified injury of head, initial encounter: Secondary | ICD-10-CM | POA: Diagnosis not present

## 2017-04-30 DIAGNOSIS — I129 Hypertensive chronic kidney disease with stage 1 through stage 4 chronic kidney disease, or unspecified chronic kidney disease: Secondary | ICD-10-CM | POA: Diagnosis not present

## 2017-04-30 DIAGNOSIS — E559 Vitamin D deficiency, unspecified: Secondary | ICD-10-CM | POA: Diagnosis not present

## 2017-05-01 DIAGNOSIS — L03114 Cellulitis of left upper limb: Secondary | ICD-10-CM | POA: Diagnosis not present

## 2017-05-01 DIAGNOSIS — T148XXA Other injury of unspecified body region, initial encounter: Secondary | ICD-10-CM | POA: Diagnosis not present

## 2017-05-02 DIAGNOSIS — Z23 Encounter for immunization: Secondary | ICD-10-CM | POA: Diagnosis not present

## 2017-05-02 DIAGNOSIS — Z0000 Encounter for general adult medical examination without abnormal findings: Secondary | ICD-10-CM | POA: Diagnosis not present

## 2017-05-15 DIAGNOSIS — E785 Hyperlipidemia, unspecified: Secondary | ICD-10-CM | POA: Diagnosis not present

## 2017-05-15 DIAGNOSIS — Q211 Atrial septal defect: Secondary | ICD-10-CM | POA: Diagnosis not present

## 2017-05-15 DIAGNOSIS — Z7982 Long term (current) use of aspirin: Secondary | ICD-10-CM | POA: Diagnosis not present

## 2017-05-15 DIAGNOSIS — M25512 Pain in left shoulder: Secondary | ICD-10-CM | POA: Diagnosis not present

## 2017-05-15 DIAGNOSIS — E559 Vitamin D deficiency, unspecified: Secondary | ICD-10-CM | POA: Diagnosis not present

## 2017-05-15 DIAGNOSIS — I2510 Atherosclerotic heart disease of native coronary artery without angina pectoris: Secondary | ICD-10-CM | POA: Diagnosis not present

## 2017-05-15 DIAGNOSIS — Z7289 Other problems related to lifestyle: Secondary | ICD-10-CM | POA: Diagnosis not present

## 2017-05-15 DIAGNOSIS — I6302 Cerebral infarction due to thrombosis of basilar artery: Secondary | ICD-10-CM | POA: Diagnosis not present

## 2017-05-15 DIAGNOSIS — I129 Hypertensive chronic kidney disease with stage 1 through stage 4 chronic kidney disease, or unspecified chronic kidney disease: Secondary | ICD-10-CM | POA: Diagnosis not present

## 2017-05-15 DIAGNOSIS — D696 Thrombocytopenia, unspecified: Secondary | ICD-10-CM | POA: Diagnosis not present

## 2017-05-15 DIAGNOSIS — Z1159 Encounter for screening for other viral diseases: Secondary | ICD-10-CM | POA: Diagnosis not present

## 2017-05-15 DIAGNOSIS — M8580 Other specified disorders of bone density and structure, unspecified site: Secondary | ICD-10-CM | POA: Diagnosis not present

## 2017-05-19 ENCOUNTER — Telehealth: Payer: Self-pay | Admitting: Neurology

## 2017-05-19 ENCOUNTER — Telehealth: Payer: Self-pay | Admitting: *Deleted

## 2017-05-19 ENCOUNTER — Telehealth: Payer: Self-pay

## 2017-05-19 NOTE — Telephone Encounter (Signed)
Patient saw Dr. Deland Pretty last week and was put on Pantoprazole 40mg  Sodium Delay Release tablet because Dr. Shelia Media said he could develop ulcers and bleed. He currently takes apixaban (ELIQUIS) 5 MG TABS tablet and aspirin 81 MG tablet. Please call and discuss because he is not sure he should take this new medication. I advised Dr. Erlinda Hong is out of the office today but I went send message to his nurse. 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

## 2017-05-19 NOTE — Telephone Encounter (Signed)
I would recommend stopping the aspirin at this point. The benefit is less than the increase in bleeding risk. MCr

## 2017-05-19 NOTE — Telephone Encounter (Signed)
Message has been left with the patient with Dr. Victorino December recommendation. He has been advised to call if he has any further questions.

## 2017-05-19 NOTE — Telephone Encounter (Signed)
05/19/17 2:00 PM  Rosalin Hawking, MD routed this conversation to Me  Rosalin Hawking, MD      05/19/17 1:40 PM  Note    Discussed with pt and he stated that he saw Dr. Shelia Media and prescribed protonix. He just concerns about if there is any interaction between the medications. I told him that the protonix will not interact with eliquis or ASA. I ask him to follow up with cardiologist to see if he still needs ASA for his CAD. If needed, he will continue to coated ASA to decrease risk of stomach irritation. He is seeing his cardiologist today.   He also mentioned about his permit to operate [employer] which will expire on 07/26/17. He is going to work with [employer]. I ask him to discuss with [employer] to see if they require specific doctor to sign his permit renewal. If not, he can come back to [place] and we will write him a letter. He expressed understanding and appreciation.  Rosalin Hawking, MD PhD Stroke Neurology 05/19/2017 1:59 PM

## 2017-05-19 NOTE — Addendum Note (Signed)
Addended by: Ricci Barker on: 05/19/2017 02:58 PM   Modules accepted: Orders

## 2017-05-19 NOTE — Telephone Encounter (Signed)
The patient came in as a walk in. He stated that he had been hospitalized in July for a stroke. He has since then been started on Eliquis 5 mg bid and Pantoprazole 40 mg daily. He would like to know Dr. Victorino December opinion on continuing with the 81 mg aspirin daily. According to the patient, Dr. Erlinda Hong his neurologist, stated that he should talk to his cardiologist about the aspirin. Message routed to the physician for his recommendation.

## 2017-05-19 NOTE — Telephone Encounter (Signed)
Discussed with pt and he stated that he saw Dr. Shelia Media and prescribed protonix. He just concerns about if there is any interaction between the medications. I told him that the protonix will not interact with eliquis or ASA. I ask him to follow up with cardiologist to see if he still needs ASA for his CAD. If needed, he will continue to coated ASA to decrease risk of stomach irritation. He is seeing his cardiologist today.   He also mentioned about his permit to operate [employer] which will expire on 07/26/17. He is going to work with [employer]. I ask him to discuss with [employer] to see if they require specific doctor to sign his permit renewal. If not, he can come back to [place] and we will write him a letter. He expressed understanding and appreciation.  Rosalin Hawking, MD PhD Stroke Neurology 05/19/2017 1:59 PM

## 2017-05-19 NOTE — Telephone Encounter (Signed)
Rn call patient about his PCP prescribing his protonix. Pt stated he does not have any acid reflux or stomach problems. He read all the side effects,and had major concerns about it. Rn recommend he call his PCP to tell him the concerns about it. PT also spoke with a pharmacist about it to.  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 PT was cleared by Dr. Erlinda Hong to return to work, but his job has guidelines if a person has a stroke they cannot drive for a period of year. Pt stated his PCP recommend he call Dr. Erlinda Hong about the form. 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 He wanted to know if Dr. Erlinda Hong cannot do does he know any colleagues who can do the paperwork. Rn stated a message will be sent to Dr. Erlinda Hong. PT verbalized understanding.

## 2017-06-04 DIAGNOSIS — L821 Other seborrheic keratosis: Secondary | ICD-10-CM | POA: Diagnosis not present

## 2017-06-04 DIAGNOSIS — D485 Neoplasm of uncertain behavior of skin: Secondary | ICD-10-CM | POA: Diagnosis not present

## 2017-06-04 DIAGNOSIS — Z85820 Personal history of malignant melanoma of skin: Secondary | ICD-10-CM | POA: Diagnosis not present

## 2017-06-04 DIAGNOSIS — D1801 Hemangioma of skin and subcutaneous tissue: Secondary | ICD-10-CM | POA: Diagnosis not present

## 2017-06-04 DIAGNOSIS — L570 Actinic keratosis: Secondary | ICD-10-CM | POA: Diagnosis not present

## 2017-06-04 DIAGNOSIS — Z85828 Personal history of other malignant neoplasm of skin: Secondary | ICD-10-CM | POA: Diagnosis not present

## 2017-06-04 DIAGNOSIS — C44519 Basal cell carcinoma of skin of other part of trunk: Secondary | ICD-10-CM | POA: Diagnosis not present

## 2017-08-25 ENCOUNTER — Other Ambulatory Visit: Payer: Self-pay

## 2017-08-25 ENCOUNTER — Telehealth: Payer: Self-pay | Admitting: Cardiovascular Disease

## 2017-08-25 DIAGNOSIS — I2510 Atherosclerotic heart disease of native coronary artery without angina pectoris: Secondary | ICD-10-CM

## 2017-08-25 DIAGNOSIS — I6302 Cerebral infarction due to thrombosis of basilar artery: Secondary | ICD-10-CM

## 2017-08-25 DIAGNOSIS — Z951 Presence of aortocoronary bypass graft: Secondary | ICD-10-CM

## 2017-08-25 MED ORDER — APIXABAN 5 MG PO TABS: 5.0000 mg | ORAL_TABLET | Freq: Two times a day (BID) | ORAL | 1 refills | Status: DC

## 2017-08-25 NOTE — Telephone Encounter (Signed)
Yes, please schedule for plain ETT (remind him to skip metoprolol that day) and yearly follow-up (does not really matter in what order).

## 2017-08-25 NOTE — Telephone Encounter (Signed)
Patient calling to have an ETT completed. Patient states that he usually has an annual ETT. I did not see order, please verify if patient needs one.

## 2017-08-25 NOTE — Telephone Encounter (Signed)
Returned call to patient. He has no cardiac complaints, is just wondering if he needs annual ETT.  Routed to MD to advise on this request

## 2017-08-26 ENCOUNTER — Telehealth: Payer: Self-pay

## 2017-08-26 NOTE — Telephone Encounter (Signed)
Rn call [employer] at [phone]. Rn spoke with Charmaine about office getting letter that patients eliqiuis was denied. Charmaine stated the medication is on the patients preferred list for his insurance. While on the phone [facility] was call, and they stated pt refill the medication too early. Pt receives his medication thru mail order. [facility] went to [facility] to get [facility] day [facility] until the mail order came in. Pt pick up medication yesterday for 30 day supply. The medication is cover on his insurance plan.

## 2017-08-26 NOTE — Telephone Encounter (Signed)
Patient called with MD recommendations. Test ordered. He is aware a scheduler will call him to set up appointments.

## 2017-09-03 ENCOUNTER — Ambulatory Visit: Payer: Medicare Other | Admitting: Neurology

## 2017-09-05 ENCOUNTER — Telehealth (HOSPITAL_COMMUNITY): Payer: Self-pay

## 2017-09-05 NOTE — Telephone Encounter (Signed)
Encounter complete. 

## 2017-09-09 ENCOUNTER — Encounter (HOSPITAL_COMMUNITY): Payer: Self-pay | Admitting: *Deleted

## 2017-09-09 ENCOUNTER — Ambulatory Visit (HOSPITAL_COMMUNITY)
Admission: RE | Admit: 2017-09-09 | Discharge: 2017-09-09 | Disposition: A | Discharge status: Home / Self Care | Payer: Medicare Other | Source: Ambulatory Visit | Attending: Internal Medicine | Admitting: Internal Medicine

## 2017-09-09 DIAGNOSIS — Z951 Presence of aortocoronary bypass graft: Secondary | ICD-10-CM | POA: Diagnosis not present

## 2017-09-09 DIAGNOSIS — I493 Ventricular premature depolarization: Secondary | ICD-10-CM | POA: Diagnosis not present

## 2017-09-09 DIAGNOSIS — I998 Other disorder of circulatory system: Secondary | ICD-10-CM | POA: Diagnosis not present

## 2017-09-09 NOTE — Progress Notes (Signed)
Dr. Debara Pickett reviewed Ett and said pt could leave.

## 2017-09-10 LAB — EXERCISE TOLERANCE TEST
Estimated workload: 17.5 METS
Exercise duration (min): 14 min
Exercise duration (sec): 44 s
MPHR: 149 {beats}/min
Peak HR: 150 {beats}/min
Percent HR: 100 %
RPE: 16
Rest HR: 57 {beats}/min

## 2017-09-12 ENCOUNTER — Telehealth: Payer: Self-pay | Admitting: Cardiovascular Disease

## 2017-09-12 NOTE — Telephone Encounter (Signed)
Notes recorded by Diana Eves, CMA on 09/12/2017 at 9:18 AM EST Results reviewed in mychart 09/12/17 at 9:03a. ------  Notes recorded by Diana Eves, CMA on 09/11/2017 at 11:28 AM EST Results released to [employer]. ------  Notes recorded by Sanda Klein, MD on 09/10/2017 at 11:53 AM EST Excellent exercise tolerance, unchanged from last year. Similar "false positive" ST depression has been seen on multiple previous stress tests, ever since his CABG.  OK for CDL.

## 2017-09-12 NOTE — Telephone Encounter (Signed)
Patient called with explanation of ETT results. He reviewed results in MyChart and was concerned about some of the study highlights. Answered all of patient questions to his satisfaction. He is thankful for Dr. Loletha Grayer and his care. No further assistance needed.

## 2017-09-12 NOTE — Telephone Encounter (Signed)
Jacob Hensley is calling about his test results . Please call   Thanks

## 2017-09-15 ENCOUNTER — Ambulatory Visit (INDEPENDENT_AMBULATORY_CARE_PROVIDER_SITE_OTHER): Payer: Medicare Other | Admitting: Neurology

## 2017-09-15 ENCOUNTER — Encounter: Payer: Self-pay | Admitting: Neurology

## 2017-09-15 VITALS — BP 135/72 | HR 80 | Wt 184.8 lb

## 2017-09-15 DIAGNOSIS — I2510 Atherosclerotic heart disease of native coronary artery without angina pectoris: Secondary | ICD-10-CM | POA: Diagnosis not present

## 2017-09-15 DIAGNOSIS — I6302 Cerebral infarction due to thrombosis of basilar artery: Secondary | ICD-10-CM

## 2017-09-15 DIAGNOSIS — I82409 Acute embolism and thrombosis of unspecified deep veins of unspecified lower extremity: Secondary | ICD-10-CM | POA: Diagnosis not present

## 2017-09-15 DIAGNOSIS — E785 Hyperlipidemia, unspecified: Secondary | ICD-10-CM

## 2017-09-15 DIAGNOSIS — I10 Essential (primary) hypertension: Secondary | ICD-10-CM | POA: Diagnosis not present

## 2017-09-15 DIAGNOSIS — E1122 Type 2 diabetes mellitus with diabetic chronic kidney disease: Secondary | ICD-10-CM | POA: Diagnosis not present

## 2017-09-15 DIAGNOSIS — E1159 Type 2 diabetes mellitus with other circulatory complications: Secondary | ICD-10-CM

## 2017-09-15 DIAGNOSIS — I129 Hypertensive chronic kidney disease with stage 1 through stage 4 chronic kidney disease, or unspecified chronic kidney disease: Secondary | ICD-10-CM | POA: Diagnosis not present

## 2017-09-15 DIAGNOSIS — Q211 Atrial septal defect: Secondary | ICD-10-CM

## 2017-09-15 DIAGNOSIS — Q2112 Patent foramen ovale: Secondary | ICD-10-CM

## 2017-09-15 NOTE — Patient Instructions (Addendum)
I had a long d/w patient about his recent stroke, risk for recurrent stroke/TIAs, personally independently reviewed imaging studies and stroke evaluation results and answered questions.Continue Eliquis (apixaban) daily  for secondary stroke prevention and maintain strict control of hypertension with blood pressure goal below 130/90, diabetes with hemoglobin A1c goal below 6.5% and lipids with LDL cholesterol goal below 70 mg/dL. I also advised the patient to eat a healthy diet with plenty of whole grains, cereals, fruits and vegetables, exercise regularly and maintain ideal body weight. Followup in the future with me in 1 year.  -continue to monitor blood pressure and blood sugar at home  -follow with PCP regarding Eliquis prescription  -follow with PCP for cholesterol level checks  -follow up in 1 year or earlier if needed

## 2017-09-15 NOTE — Progress Notes (Signed)
STROKE NEUROLOGY FOLLOW UP NOTE  NAME: Jacob Hensley DOB: 1946-02-14  REASON FOR VISIT: stroke follow up HISTORY FROM: pt and chart  Today we had the pleasure of seeing Jacob Hensley in follow-up at our Neurology Clinic. Pt was accompanied by no one.   History Summary [name] is a 72 y.o. male with history of  hypertension, hyperlipidemia, diet-controlled diabetes, CAD s/p CABG, remote DVT post CABG procedure admitted on 01/26/17 for dizziness, slurred speech and gait instability. MRI showed small acute linear infarct in the right pons. MRA, CUS, TTE all unremarkable. However, he was found to have right LE subacute DVT and spencer degree II PFO with valsalva maneuver. LDL 72 and A1C 6.7. 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 Due to his recurrent DVTs with PFO, he was put on eliquis for DVT and stroke prevention. Also continued ASA for cardiac prevention. crestor also continued. He was discharged in good condition.   History 03/03/17 During the interval time, the patient has been doing well.  He is very active, walking and swimming. His glucose in good control. BP fluctuates at home, but still within the goal. 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 He is on eliquis and ASA without bleeding. Bp today 151/88. He has not take his BP meds today.   History 09/15/17 Since previous visit, patient did have a fall on 04/30/2017 on Eliquis.  No acute intracranial abnormality was found.  Patient doing well overall.  Denies residual side effects from stroke on 01/26/2017.  Patient continues to take Eliquis which she is tolerating well.  Denies side effects of increased bleeding or bruising.  Her patient's last A1c 6.6.  Patient has been trying to eat healthier and avoid sugars and glucose levels have been ranging from 90s-150s.  PCP has been following glycemic control.  Patient  recently underwent ETT which showed excellent exercise tolerance and was unchanged from last year.  Blood pressure remains satisfactory at today's visit 138/72.  Patient states this is typically what it runs at home.  Patient continues to take Crestor and denies myalgias.  Patient denies new or worsening TIA stroke symptoms.   REVIEW OF SYSTEMS: Full 14 system review of systems performed and notable only for those listed below and in HPI above, all others are negative: runny nose Interesting  PAST MEDICAL HISTORY Past Medical History:  Diagnosis Date  . BPH (benign prostatic hyperplasia)   . Coronary artery disease    CABG 2003-DR. CROITORU IS PT'S CARDIOLOGIST - LAST OFFICE VISIT 08/02/11-PT EXERCISES DAILY-NO C/O OF CHEST PAIN  . Diabetes mellitus    BORDERLINE - PT CHECKS HIS BLOOD SUGARS AT HOME - NO MEDS  . DVT of leg (deep venous thrombosis) ([place]) 2003   S/P CABG SURGERY  . Hyperlipidemia   . Hypertension   . Stroke ([place])   . Urine retention    PT STATES HE HAS BEEN DOING I&O CATHS FOR PAST 3 YRS    PAST SURGICAL HISTORY Past Surgical History:  Procedure Laterality Date  . CORONARY ARTERY BYPASS GRAFT  02/05/2002   LIMA to LAD,RIMA to PDA,SVG to diagonal & free left radial arter to the oblique marginal artery - Dr. Cyndia Bent  . CPET w/PFT  1//20/2014   Normal  . CYSTOSCOPY WITH BIOPSY  11/08/2011   Procedure: CYSTOSCOPY WITH BIOPSY;  Surgeon: Marja Kays  Junious Silk, MD;  Location: [facility];  Service: Urology;;  Bladder biopsies  . CYSTOSCOPY WITH BIOPSY  06/19/2012   Procedure: CYSTOSCOPY WITH BIOPSY;  Surgeon: Fredricka Bonine, MD;  Location: [facility];  Service: Urology;  Laterality: N/A;  fulgaration of bleeders  . myolema of back  2012  . TONSILLECTOMY    . TRANSURETHRAL RESECTION OF PROSTATE  11-08-2011  . US ECHOCARDIOGRAPHY  04/14/2009   EF >50%,borderline dilated RV w/normal systolic fx,mildly dilated LA,mild AI,MR,TR.   Family History  Problem Relation Age of Onset    . Cancer Mother        Breast  . Diabetes Father   . Heart failure Father     CURRENT MEDICATIONS Current Outpatient Medications on File Prior to Visit  Medication Sig Dispense Refill  . apixaban (ELIQUIS) 5 MG TABS tablet Take 1 tablet (5 mg total) by mouth 2 (two) times daily. 60 tablet 1  . metoprolol (TOPROL-XL) 50 MG 24 hr tablet Take 50 mg by mouth daily before breakfast.     . OVER THE COUNTER MEDICATION VITALIZER GOLD  - SHAKELEE FOOD SUPPLEMENT    . ramipril (ALTACE) 10 MG capsule Take 10 mg by mouth daily after breakfast.     . rosuvastatin (CRESTOR) 40 MG tablet Take 1 tablet (40 mg total) by mouth at bedtime. 90 tablet 3   No current facility-administered medications on file prior to visit.     SOCIAL HISTORY Social History   Socioeconomic History  . Marital status: Married    Spouse name: None  . Number of children: None  . Years of education: None  . Highest education level: None  Social Needs  . Financial resource strain: None  . Food insecurity - worry: None  . Food insecurity - inability: None  . Transportation needs - medical: None  . Transportation needs - non-medical: None  Occupational History  . None  Tobacco Use  . Smoking status: Never Smoker  . Smokeless tobacco: Never Used  Substance and Sexual Activity  . Alcohol use: No  . Drug use: No  . Sexual activity: None  Other Topics Concern  . None  Social History Narrative  . None   NKDA    Physical Exam:  General: well developed, elderly caucasian male, well nourished, seated, in no evident distress Head: head normocephalic and atraumatic.   Neck: supple with no carotid or supraclavicular bruits Cardiovascular: regular rate and rhythm, no murmurs Musculoskeletal: no deformity Skin:  no rash/petichiae Vascular:  Normal pulses all extremities   Neurologic Examination Mental Status -  Level of arousal and orientation to time, place, and person were intact. Language including  expression, naming, repetition, comprehension was assessed and found intact. Fund of Knowledge was assessed and was intact.  Cranial Nerves II - XII - II - Visual field intact OU. III, IV, VI - Extraocular movements intact. V - Facial sensation intact bilaterally. VII - Facial movement intact bilaterally. VIII - Hearing & vestibular intact bilaterally. X - Palate elevates symmetrically. XI - Chin turning & shoulder shrug intact bilaterally. XII - Tongue protrusion intact  Motor Strength - The patient's strength was normal in all extremities and pronator drift was absent.  Bulk was normal and fasciculations were absent.   Motor Tone - Muscle tone was assessed at the neck and appendages and was normal.  Reflexes - The patient's reflexes were 2+ in all extremities and he had no pathological reflexes.  Sensory - Light touch, temperature/pinprick were assessed and were  normal.    Coordination - The patient had normal movements in the hands and feet with no ataxia or dysmetria.  Tremor was absent.  Gait and Station - The patient's transfers, posture, gait, station, and turns were observed as normal.     Data reviewed: I personally reviewed the images and agree with the radiology interpretations.   Ct Head Wo Contrast 01/26/2017 IMPRESSION: Normal for age non contrast CT appearance of the brain.  [name] Head Wo Contrast 01/27/2017 IMPRESSION: No large vessel occlusion, aneurysm, or significant stenosis is identified. Unremarkable MRA of the head.  Mr Brain Wo Contrast (neuro Protocol) 01/26/2017 IMPRESSION: 1. Small acute linear infarct in the right pons, Basilar Artery perforator branch territory. No associated hemorrhage or mass effect. 2. Small chronic lacunar infarct in the medial right thalamus.  CUS - Bilateral: 1-39% ICA stenosis. Vertebral artery flow is antegrade.  TTE - Left ventricle: The cavity size was normal. Wall thickness was increased in a pattern of mild LVH.  Systolic function was normal. The estimated ejection fraction was in the range of 60% to 65%. Doppler parameters are consistent with abnormal left ventricular relaxation (grade 1 diastolic dysfunction). - Aortic valve: There was mild regurgitation.  LE venous Doppler Right lower extremity is positivefor subacute versus age indeterminatedeep vein thrombosis involving the distal right femoral vein, right popliteal vein, right posterior tibial veins, right peroneal veins, and right gastrocnemius veins. There is no evidence of left lower extremity deep vein thrombosis orBaker's cyst bilaterally  TCD bubble study - High intensity transient signals (HITS) (17) were heard with Valsalva, suggestive of a small patent foramen ovale (PFO) with spencer degree II.  Component     Latest Ref Rng & Units 01/27/2017  Cholesterol     0 - 200 mg/dL 142  Triglycerides     <150 mg/dL 153 (H)  HDL Cholesterol     >40 mg/dL 39 (L)  Total CHOL/HDL Ratio     RATIO 3.6  VLDL     0 - 40 mg/dL 31  LDL (calc)     0 - 99 mg/dL 72  Hemoglobin A1C     4.8 - 5.6 % 6.7 (H)  Mean Plasma Glucose     mg/dL 146    Assessment: As you may recall, he is a 72 y.o. Caucasian male with PMH of hypertension, hyperlipidemia, diet-controlled diabetes, CAD s/p CABG, remote DVT post CABG procedure admitted on 01/26/17 for small acute linear infarct in the right pons. MRA, CUS, TTE all unremarkable. However, he was found to have right LE subacute DVT and spencer degree II PFO with valsalva maneuver. 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 Although the stroke felt likely due to small vessel disease, due to his recurrent DVTs with PFO, he was put on eliquis for DVT and stroke prevention. Patient is doing well at todays follow up visit with no new concerns.  Plan: I had a long d/w patient about his recent stroke, risk for recurrent stroke/TIAs, personally independently reviewed imaging studies  and stroke evaluation results and answered questions.Continue Eliquis (apixaban) daily  for secondary stroke prevention and maintain strict control of hypertension with blood pressure goal below 130/90, diabetes with hemoglobin A1c goal below 6.5% and lipids with LDL cholesterol goal below 70 mg/dL. I also advised the patient to eat a healthy diet with plenty of whole grains, cereals, fruits and vegetables, exercise regularly and maintain ideal body weight. Followup in the future with Janett Billow,  NP in 1 year.   -continue to monitor BP and BG at home   -follow with PCP for lipid level maintenance  -follow with PCP for maintenance of Eliquis prescription  -follow up in 1 year or earlier if needed   Rosalin Hawking, MD PhD [facility] [address] [place], [place] [address] [phone]

## 2017-09-16 DIAGNOSIS — E785 Hyperlipidemia, unspecified: Secondary | ICD-10-CM | POA: Diagnosis not present

## 2017-09-16 DIAGNOSIS — E119 Type 2 diabetes mellitus without complications: Secondary | ICD-10-CM | POA: Diagnosis not present

## 2017-09-16 DIAGNOSIS — Z86718 Personal history of other venous thrombosis and embolism: Secondary | ICD-10-CM | POA: Diagnosis not present

## 2017-09-16 DIAGNOSIS — I129 Hypertensive chronic kidney disease with stage 1 through stage 4 chronic kidney disease, or unspecified chronic kidney disease: Secondary | ICD-10-CM | POA: Diagnosis not present

## 2017-09-16 DIAGNOSIS — I2510 Atherosclerotic heart disease of native coronary artery without angina pectoris: Secondary | ICD-10-CM | POA: Diagnosis not present

## 2017-10-14 ENCOUNTER — Encounter: Payer: Medicare Other | Attending: Internal Medicine | Admitting: Registered"

## 2017-10-14 ENCOUNTER — Encounter: Payer: Self-pay | Admitting: Registered"

## 2017-10-14 DIAGNOSIS — I519 Heart disease, unspecified: Secondary | ICD-10-CM | POA: Diagnosis not present

## 2017-10-14 DIAGNOSIS — E119 Type 2 diabetes mellitus without complications: Secondary | ICD-10-CM | POA: Diagnosis not present

## 2017-10-14 DIAGNOSIS — E7800 Pure hypercholesterolemia, unspecified: Secondary | ICD-10-CM | POA: Insufficient documentation

## 2017-10-14 DIAGNOSIS — Z713 Dietary counseling and surveillance: Secondary | ICD-10-CM | POA: Diagnosis not present

## 2017-10-14 NOTE — Progress Notes (Signed)
Diabetes Self-Management Education  Visit Type: First/Initial  Appt. Start Time: 1100 Appt. End Time: 1230  10/14/2017  Mr. Jacob Hensley, identified by name and date of birth, is a 72 y.o. male with a diagnosis of Diabetes: Type 2.   ASSESSMENT Patient states he is frustrated with diagnosis because he feels like he is doing all the right things to be healthy. Patient states he believes his father died from complications of diabetes when his father was in his 87's.   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  Patient shared information from apps on his phone that tracks sleep, food, and glucose readings. Per BG average his app estimates A1c at 5.5%. Patient has cut out potatoes, rice and closely watching overall carb intake. Other than a few morning high numbers, it appears BG is under good control.  Diabetes Self-Management Education - 10/14/17 1126      Visit Information   Visit Type  First/Initial      Initial Visit   Diabetes Type  Type 2    Are you currently following a meal plan?  Yes    What type of meal plan do you follow?  watching carbs, sugar    Are you taking your medications as prescribed?  Not on Medications    Date Diagnosed  unknown      Health Coping   How would you rate your overall health?  Good      Psychosocial Assessment   Patient Belief/Attitude about Diabetes  Motivated to manage diabetes    How often do you need to have someone help you when you read instructions, pamphlets, or other written materials from your doctor or pharmacy?  1 - Never    What is the last grade level you completed in school?  12      Complications   Last HgB A1C per patient/outside source  6.3 % per patient    How often do you check your blood sugar?  1-2 times/day wants to check more, but insurance doesn't cover    Fasting Blood glucose  range (mg/dL)  70-129;130-179 102-150    Postprandial Blood glucose range (mg/dL)  130-179 135-160    Number of hypoglycemic episodes per month  0    Number of hyperglycemic episodes per week  0    Have you had a dilated eye exam in the past 12 months?  Yes    Have you had a dental exam in the past 12 months?  Yes    Are you checking your feet?  Yes    How many days per week are you checking your feet?  7      Dietary Intake   Breakfast  Real Meal protein shake 20 g PRO, 25 g CHO    Snack (morning)  none    Lunch  pinto beans OR salad, boiled egg OR protein drink (no carbs)    Snack (afternoon)  pure protein bar  OR protein powder drink    Merchant [employer] officer, water    Snack (evening)  none OR popcorn    Beverage(s)  water OR protein powder drink      Exercise   Exercise Type  Light (walking / raking leaves)    How many days per week to you exercise?  4    How many minutes  per day do you exercise?  90    Total minutes per week of exercise  360      Patient Education   Previous Diabetes Education  Yes (please comment) 2003 after heart surgery    Disease state   Factors that contribute to the development of diabetes    Nutrition management   Role of diet in the treatment of diabetes and the relationship between the three main macronutrients and blood glucose level    Physical activity and exercise   Role of exercise on diabetes management, blood pressure control and cardiac health.    Monitoring  Taught/discussed recording of test results and interpretation of SMBG.    Psychosocial adjustment  Role of stress on diabetes      Individualized Goals (developed by patient)   Nutrition  General guidelines for healthy choices and portions discussed    Monitoring   test my blood glucose as discussed      Outcomes   Expected Outcomes  Demonstrated interest in learning. Expect positive outcomes    Future DMSE  PRN    Program Status  Completed     Individualized Plan for Diabetes  Self-Management Training:   Learning Objective:  Patient will have a greater understanding of diabetes self-management. Patient education plan is to attend individual and/or group sessions per assessed needs and concerns.   Patient Instructions  Aim to eat balanced meals and snacks, include carb especially with dinner to help with the morning higher blood sugar. Continue with your physical activity. Continue checking fasting blood sugar to see if a bedtime snacks work to bring it down. Can test after 2 hrs after meals to see how adding some carbs works for your blood sugar.  Expected Outcomes:  Demonstrated interest in learning. Expect positive outcomes  Education material provided: A1C conversion sheet, My Plate and Snack sheet, Morning High Blood Sugar  If problems or questions, patient to contact team via:  Phone and Mychart  Future DSME appointment: PRN

## 2017-10-14 NOTE — Patient Instructions (Addendum)
Aim to eat balanced meals and snacks, include carb especially with dinner to help with the morning higher blood sugar. Continue with your physical activity. Continue checking fasting blood sugar to see if a bedtime snacks work to bring it down. Can test after 2 hrs after meals to see how adding some carbs works for your blood sugar.

## 2017-12-02 DIAGNOSIS — D1801 Hemangioma of skin and subcutaneous tissue: Secondary | ICD-10-CM | POA: Diagnosis not present

## 2017-12-02 DIAGNOSIS — L570 Actinic keratosis: Secondary | ICD-10-CM | POA: Diagnosis not present

## 2017-12-02 DIAGNOSIS — L821 Other seborrheic keratosis: Secondary | ICD-10-CM | POA: Diagnosis not present

## 2017-12-02 DIAGNOSIS — Z85820 Personal history of malignant melanoma of skin: Secondary | ICD-10-CM | POA: Diagnosis not present

## 2017-12-02 DIAGNOSIS — D225 Melanocytic nevi of trunk: Secondary | ICD-10-CM | POA: Diagnosis not present

## 2017-12-02 DIAGNOSIS — Z85828 Personal history of other malignant neoplasm of skin: Secondary | ICD-10-CM | POA: Diagnosis not present

## 2017-12-02 DIAGNOSIS — D2261 Melanocytic nevi of right upper limb, including shoulder: Secondary | ICD-10-CM | POA: Diagnosis not present

## 2017-12-15 DIAGNOSIS — I2510 Atherosclerotic heart disease of native coronary artery without angina pectoris: Secondary | ICD-10-CM | POA: Diagnosis not present

## 2017-12-15 DIAGNOSIS — Q211 Atrial septal defect: Secondary | ICD-10-CM | POA: Diagnosis not present

## 2017-12-15 DIAGNOSIS — Z86718 Personal history of other venous thrombosis and embolism: Secondary | ICD-10-CM | POA: Diagnosis not present

## 2017-12-15 DIAGNOSIS — E785 Hyperlipidemia, unspecified: Secondary | ICD-10-CM | POA: Diagnosis not present

## 2017-12-15 DIAGNOSIS — I129 Hypertensive chronic kidney disease with stage 1 through stage 4 chronic kidney disease, or unspecified chronic kidney disease: Secondary | ICD-10-CM | POA: Diagnosis not present

## 2017-12-15 DIAGNOSIS — E119 Type 2 diabetes mellitus without complications: Secondary | ICD-10-CM | POA: Diagnosis not present

## 2018-01-19 DIAGNOSIS — N5201 Erectile dysfunction due to arterial insufficiency: Secondary | ICD-10-CM | POA: Diagnosis not present

## 2018-01-19 DIAGNOSIS — N400 Enlarged prostate without lower urinary tract symptoms: Secondary | ICD-10-CM | POA: Diagnosis not present

## 2018-01-19 DIAGNOSIS — C672 Malignant neoplasm of lateral wall of bladder: Secondary | ICD-10-CM | POA: Diagnosis not present

## 2018-01-21 ENCOUNTER — Telehealth: Payer: Self-pay | Admitting: Adult Health

## 2018-01-21 NOTE — Telephone Encounter (Signed)
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 Pt will pick up, please call when ready.

## 2018-01-21 NOTE — Telephone Encounter (Signed)
Rn consulted with Janett Billow NP. Pt needs to be seen before a letter can be written. Pt next appt is 08/2018. Pt will call for an appt.

## 2018-01-21 NOTE — Telephone Encounter (Signed)
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

## 2018-01-22 ENCOUNTER — Encounter: Payer: Self-pay | Admitting: Adult Health

## 2018-01-22 ENCOUNTER — Ambulatory Visit (INDEPENDENT_AMBULATORY_CARE_PROVIDER_SITE_OTHER): Payer: Medicare Other | Admitting: Adult Health

## 2018-01-22 VITALS — BP 144/80 | HR 52 | Ht 69.0 in | Wt 172.2 lb

## 2018-01-22 DIAGNOSIS — I6302 Cerebral infarction due to thrombosis of basilar artery: Secondary | ICD-10-CM | POA: Diagnosis not present

## 2018-01-22 DIAGNOSIS — Q2112 Patent foramen ovale: Secondary | ICD-10-CM

## 2018-01-22 DIAGNOSIS — E785 Hyperlipidemia, unspecified: Secondary | ICD-10-CM | POA: Diagnosis not present

## 2018-01-22 DIAGNOSIS — Q211 Atrial septal defect: Secondary | ICD-10-CM | POA: Diagnosis not present

## 2018-01-22 DIAGNOSIS — I10 Essential (primary) hypertension: Secondary | ICD-10-CM | POA: Diagnosis not present

## 2018-01-22 DIAGNOSIS — I82409 Acute embolism and thrombosis of unspecified deep veins of unspecified lower extremity: Secondary | ICD-10-CM | POA: Diagnosis not present

## 2018-01-22 NOTE — Progress Notes (Signed)
STROKE NEUROLOGY FOLLOW UP NOTE  NAME: Jacob Hensley DOB: 01-27-1946  REASON FOR VISIT: stroke follow up HISTORY FROM: pt and chart  Today we had the pleasure of seeing Jacob Hensley in follow-up at our Neurology Clinic. Pt was accompanied by no one.   History Summary Mr. Jacob Hensley is a 72 y.o. male with history of  hypertension, hyperlipidemia, diet-controlled diabetes, CAD s/p CABG, remote DVT post CABG procedure admitted on 01/26/17 for dizziness, slurred speech and gait instability. MRI showed small acute linear infarct in the right pons. MRA, CUS, TTE all unremarkable. However, he was found to have right LE subacute DVT and spencer degree II PFO with valsalva maneuver. LDL 72 and A1C 6.7. 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 Due to his recurrent DVTs with PFO, he was put on eliquis for DVT and stroke prevention. Also continued ASA for cardiac prevention. crestor also continued. He was discharged in good condition.   History 03/03/17 During the interval time, the patient has been doing well.  He is very active, walking and swimming. His glucose in good control. BP fluctuates at home, but still within the goal. 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 He is on eliquis and ASA without bleeding. Bp today 151/88. He has not take his BP meds today.   History 09/15/17 Since previous visit, patient did have a fall on 04/30/2017 on Eliquis.  No acute intracranial abnormality was found.  Patient doing well overall.  Denies residual side effects from stroke on 01/26/2017.  Patient continues to take Eliquis which she is tolerating well.  Denies side effects of increased bleeding or bruising.  Her patient's last A1c 6.6.  Patient has been trying to eat healthier and avoid sugars and glucose levels have been ranging from 90s-150s.  PCP has been following glycemic control.  Patient  recently underwent ETT which showed excellent exercise tolerance and was unchanged from last year.  Blood pressure remains satisfactory at today's visit 138/72.  Patient states this is typically what it runs at home.  Patient continues to take Crestor and denies myalgias.  Patient denies new or worsening TIA stroke symptoms.   01/22/18 UPDATE: Patient was seen today for requested follow-up appointment to return to work.  Patient works as a Agricultural engineer and it was required to wait 1 year after having stroke in order to return to work.  Patient has been doing well from a neurological standpoint without residual deficits.  He continues to take Eliquis without side effects of bleeding or bruising.  Continues to take Crestor without side effects myalgias.  Blood pressure at today's appointment elevated for patient at 144/80 but patient does check BP at home and typically 110s to 120s over 60s to 70s.  Patient does regularly follow with PCP, urologist and cardiologist.  Denies new or worsening stroke/TIA symptoms at this time.     REVIEW OF SYSTEMS: Full 14 system review of systems performed and notable only for those listed below and in HPI above, all others are negative: No complaints   PAST MEDICAL HISTORY Past Medical History:  Diagnosis Date  . BPH (benign prostatic hyperplasia)   . Coronary artery disease    CABG 2003-DR. CROITORU IS PT'S CARDIOLOGIST - LAST OFFICE VISIT 08/02/11-PT EXERCISES DAILY-NO C/O OF CHEST PAIN  . Diabetes mellitus    BORDERLINE - PT  CHECKS HIS BLOOD SUGARS AT HOME - NO MEDS  . DVT of leg (deep venous thrombosis) ([place]) 2003   S/P CABG SURGERY  . Hyperlipidemia   . Hypertension   . Stroke ([place])   . Urine retention    PT STATES HE HAS BEEN DOING I&O CATHS FOR PAST 3 YRS    PAST SURGICAL HISTORY Past Surgical History:  Procedure Laterality Date  . CORONARY ARTERY BYPASS GRAFT  02/05/2002   LIMA to LAD,RIMA to PDA,SVG to diagonal & free left radial arter to the  oblique marginal artery - Dr. Cyndia Bent  . CPET w/PFT  1//20/2014   Normal  . CYSTOSCOPY WITH BIOPSY  11/08/2011   Procedure: CYSTOSCOPY WITH BIOPSY;  Surgeon: Fredricka Bonine, MD;  Location: [facility];  Service: Urology;;  Bladder biopsies  . CYSTOSCOPY WITH BIOPSY  06/19/2012   Procedure: CYSTOSCOPY WITH BIOPSY;  Surgeon: Fredricka Bonine, MD;  Location: [facility];  Service: Urology;  Laterality: N/A;  fulgaration of bleeders  . myolema of back  2012  . TONSILLECTOMY    . TRANSURETHRAL RESECTION OF PROSTATE  11-08-2011  . US ECHOCARDIOGRAPHY  04/14/2009   EF >50%,borderline dilated RV w/normal systolic fx,mildly dilated LA,mild AI,MR,TR.   Family History  Problem Relation Age of Onset  . Cancer Mother        Breast  . Diabetes Father   . Heart failure Father     CURRENT MEDICATIONS Current Outpatient Medications on File Prior to Visit  Medication Sig Dispense Refill  . apixaban (ELIQUIS) 5 MG TABS tablet Take 1 tablet (5 mg total) by mouth 2 (two) times daily. 60 tablet 1  . Ascorbic Acid (VITAMIN C) 1000 MG tablet Take 1,000 mg by mouth daily.    . cholecalciferol (VITAMIN D) 1000 units tablet Take 1,000 Units by mouth daily.    . metoprolol (TOPROL-XL) 50 MG 24 hr tablet Take 50 mg by mouth daily before breakfast.     . Multiple Vitamins-Minerals (MULTIVITAMIN ADULT PO) Take by mouth.    . ramipril (ALTACE) 10 MG capsule Take 10 mg by mouth daily after breakfast.     . rosuvastatin (CRESTOR) 40 MG tablet Take 1 tablet (40 mg total) by mouth at bedtime. 90 tablet 3   No current facility-administered medications on file prior to visit.     SOCIAL HISTORY Social History   Socioeconomic History  . Marital status: Married    Spouse name: Not on file  . Number of children: Not on file  . Years of education: Not on file  . Highest education level: Not on file  Occupational History  . Not on file  Social Needs  . Financial resource strain: Not on file  . Food  insecurity:    Worry: Not on file    Inability: Not on file  . Transportation needs:    Medical: Not on file    Non-medical: Not on file  Tobacco Use  . Smoking status: Never Smoker  . Smokeless tobacco: Never Used  Substance and Sexual Activity  . Alcohol use: No  . Drug use: No  . Sexual activity: Not on file  Lifestyle  . Physical activity:    Days per week: Not on file    Minutes per session: Not on file  . Stress: Not on file  Relationships  . Social connections:    Talks on phone: Not on file    Gets together: Not on file    Attends religious service: Not on  file    Active member of club or organization: Not on file    Attends meetings of clubs or organizations: Not on file    Relationship status: Not on file  Other Topics Concern  . Not on file  Social History Narrative  . Not on file   NKDA    Physical Exam:  Vitals:   01/22/18 1105  BP: (!) 144/80  Pulse: (!) 52    General: well developed, elderly caucasian male, well nourished, seated, in no evident distress Head: head normocephalic and atraumatic.   Neck: supple with no carotid or supraclavicular bruits Cardiovascular: regular rate and rhythm, no murmurs Musculoskeletal: no deformity Skin:  no rash/petichiae Vascular:  Normal pulses all extremities   Neurologic Examination Mental Status -  Level of arousal and orientation to time, place, and person were intact. Language including expression, naming, repetition, comprehension was assessed and found intact. Fund of Knowledge was assessed and was intact.  Cranial Nerves II - XII - II - Visual field intact OU. III, IV, VI - Extraocular movements intact. V - Facial sensation intact bilaterally. VII - Facial movement intact bilaterally. VIII - Hearing & vestibular intact bilaterally. X - Palate elevates symmetrically. XI - Chin turning & shoulder shrug intact bilaterally. XII - Tongue protrusion intact  Motor Strength - The patient's strength  was normal in all extremities and pronator drift was absent.  Bulk was normal and fasciculations were absent.   Motor Tone - Muscle tone was assessed at the neck and appendages and was normal.  Reflexes - The patient's reflexes were 2+ in all extremities and he had no pathological reflexes.  Sensory - Light touch, temperature/pinprick were assessed and were normal.    Coordination - The patient had normal movements in the hands and feet with no ataxia or dysmetria.  Tremor was absent.  Gait and Station - The patient's transfers, posture, gait, station, and turns were observed as normal.     Data reviewed: I personally reviewed the images and agree with the radiology interpretations.   Ct Head Wo Contrast 01/26/2017 IMPRESSION: Normal for age non contrast CT appearance of the brain.  [name] Head Wo Contrast 01/27/2017 IMPRESSION: No large vessel occlusion, aneurysm, or significant stenosis is identified. Unremarkable MRA of the head.  Mr Brain Wo Contrast (neuro Protocol) 01/26/2017 IMPRESSION: 1. Small acute linear infarct in the right pons, Basilar Artery perforator branch territory. No associated hemorrhage or mass effect. 2. Small chronic lacunar infarct in the medial right thalamus.  CUS - Bilateral: 1-39% ICA stenosis. Vertebral artery flow is antegrade.  TTE - Left ventricle: The cavity size was normal. Wall thickness was increased in a pattern of mild LVH. Systolic function was normal. The estimated ejection fraction was in the range of 60% to 65%. Doppler parameters are consistent with abnormal left ventricular relaxation (grade 1 diastolic dysfunction). - Aortic valve: There was mild regurgitation.  LE venous Doppler Right lower extremity is positivefor subacute versus age indeterminatedeep vein thrombosis involving the distal right femoral vein, right popliteal vein, right posterior tibial veins, right peroneal veins, and right gastrocnemius veins. There is  no evidence of left lower extremity deep vein thrombosis orBaker's cyst bilaterally  TCD bubble study - High intensity transient signals (HITS) (17) were heard with Valsalva, suggestive of a small patent foramen ovale (PFO) with spencer degree II.  Component     Latest Ref Rng & Units 01/27/2017  Cholesterol     0 - 200 mg/dL 142  Triglycerides     <  150 mg/dL 153 (H)  HDL Cholesterol     >40 mg/dL 39 (L)  Total CHOL/HDL Ratio     RATIO 3.6  VLDL     0 - 40 mg/dL 31  LDL (calc)     0 - 99 mg/dL 72  Hemoglobin A1C     4.8 - 5.6 % 6.7 (H)  Mean Plasma Glucose     mg/dL 146    Assessment: As you may recall, he is a 72 y.o. Caucasian male with PMH of hypertension, hyperlipidemia, diet-controlled diabetes, CAD s/p CABG, remote DVT post CABG procedure admitted on 01/26/17 for small acute linear infarct in the right pons. MRA, CUS, TTE all unremarkable. However, he was found to have right LE subacute DVT and spencer degree II PFO with valsalva maneuver. 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 Although the stroke felt likely due to small vessel disease, due to his recurrent DVTs with PFO, he was put on eliquis for DVT and stroke prevention.  Patient returns today by request in order to be released to return back to work as it has been approximately 1 year since his stroke.  Patient is doing well from a neurological standpoint.  Plan: -Continue Eliquis (apixaban) daily  and Crestor for secondary stroke prevention -f/u with cardiologist regarding Eliquis management am prescribing for recurring DVTs with PFO -F/u with PCP regarding your cholesterol blood pressure management -Patient cleared to return to work as it has been 1 year (01/26/2017) and he is neurologically stable without residual deficits -continue to monitor BP at home -Advised patient to continue to stay active and eat a healthy diet -Maintain strict control of hypertension with blood pressure goal  below 130/90, diabetes with hemoglobin A1c goal below 6.5% and cholesterol with LDL cholesterol (bad cholesterol) goal below 70 mg/dL. I also advised the patient to eat a healthy diet with plenty of whole grains, cereals, fruits and vegetables, exercise regularly and maintain ideal body weight.  Follow up as previously scheduled on 09/16/2018 or call earlier if needed  Venancio Poisson, [facility]  [facility] [address] [place], [place] [address]  Phone [phone] Fax [phone] Note: This document was prepared with digital dictation and possible smart phrase technology. Any transcriptional errors that result from this process are unintentional.

## 2018-01-22 NOTE — Patient Instructions (Addendum)
Continue Eliquis (apixaban) daily  and crestor  for secondary stroke prevention  Continue to follow up with PCP regarding cholesterol and blood pressure management    Continue to stay active and eat healthy  You are cleared to return to work full time without restrictions as you have made a good recovery from your stroke on 01/26/17  Continue to monitor blood pressure at home  Maintain strict control of hypertension with blood pressure goal below 130/90, diabetes with hemoglobin A1c goal below 6.5% and cholesterol with LDL cholesterol (bad cholesterol) goal below 70 mg/dL. I also advised the patient to eat a healthy diet with plenty of whole grains, cereals, fruits and vegetables, exercise regularly and maintain ideal body weight.  Followup in the future with me with scheduled appointment on 09/16/18 or call earlier if needed            Thank you for coming to see [place] at [facility]. I hope we have been able to provide you high quality care today.  You may receive a patient satisfaction survey over the next few weeks. We would appreciate your feedback and comments so that we may continue to improve ourselves and the health of our patients.

## 2018-01-22 NOTE — Progress Notes (Signed)
I agree with the above plan 

## 2018-02-16 ENCOUNTER — Other Ambulatory Visit: Payer: Self-pay

## 2018-02-16 DIAGNOSIS — I6302 Cerebral infarction due to thrombosis of basilar artery: Secondary | ICD-10-CM

## 2018-02-16 MED ORDER — APIXABAN 5 MG PO TABS: 5.0000 mg | ORAL_TABLET | Freq: Two times a day (BID) | ORAL | 1 refills | Status: DC

## 2018-04-12 ENCOUNTER — Telehealth: Payer: Self-pay | Admitting: Neurology

## 2018-04-12 DIAGNOSIS — J029 Acute pharyngitis, unspecified: Secondary | ICD-10-CM | POA: Diagnosis not present

## 2018-04-12 DIAGNOSIS — Z23 Encounter for immunization: Secondary | ICD-10-CM | POA: Diagnosis not present

## 2018-04-12 NOTE — Telephone Encounter (Signed)
Patient called Friday at 23.53 hours and identified himself only as a patient of Dr Erlinda Hong-  to discuss an ear ache and which pain medication he can take to treat it. He is on Eliquis. I told him Tylenol is safe. He had more to say, but accepted my brief advice at this progressed hour.

## 2018-04-13 NOTE — Telephone Encounter (Signed)
Noted! Thank you

## 2018-04-14 NOTE — Telephone Encounter (Signed)
Agree. thanks

## 2018-04-21 DIAGNOSIS — J209 Acute bronchitis, unspecified: Secondary | ICD-10-CM | POA: Diagnosis not present

## 2018-05-19 DIAGNOSIS — E785 Hyperlipidemia, unspecified: Secondary | ICD-10-CM | POA: Diagnosis not present

## 2018-05-19 DIAGNOSIS — E559 Vitamin D deficiency, unspecified: Secondary | ICD-10-CM | POA: Diagnosis not present

## 2018-05-19 DIAGNOSIS — I10 Essential (primary) hypertension: Secondary | ICD-10-CM | POA: Diagnosis not present

## 2018-05-19 DIAGNOSIS — Z125 Encounter for screening for malignant neoplasm of prostate: Secondary | ICD-10-CM | POA: Diagnosis not present

## 2018-05-19 DIAGNOSIS — N182 Chronic kidney disease, stage 2 (mild): Secondary | ICD-10-CM | POA: Diagnosis not present

## 2018-05-19 DIAGNOSIS — D696 Thrombocytopenia, unspecified: Secondary | ICD-10-CM | POA: Diagnosis not present

## 2018-05-26 DIAGNOSIS — I2510 Atherosclerotic heart disease of native coronary artery without angina pectoris: Secondary | ICD-10-CM | POA: Diagnosis not present

## 2018-05-26 DIAGNOSIS — E559 Vitamin D deficiency, unspecified: Secondary | ICD-10-CM | POA: Diagnosis not present

## 2018-05-26 DIAGNOSIS — I10 Essential (primary) hypertension: Secondary | ICD-10-CM | POA: Diagnosis not present

## 2018-05-26 DIAGNOSIS — Z0000 Encounter for general adult medical examination without abnormal findings: Secondary | ICD-10-CM | POA: Diagnosis not present

## 2018-05-26 DIAGNOSIS — E1122 Type 2 diabetes mellitus with diabetic chronic kidney disease: Secondary | ICD-10-CM | POA: Diagnosis not present

## 2018-05-26 DIAGNOSIS — E785 Hyperlipidemia, unspecified: Secondary | ICD-10-CM | POA: Diagnosis not present

## 2018-06-10 DIAGNOSIS — E785 Hyperlipidemia, unspecified: Secondary | ICD-10-CM | POA: Diagnosis not present

## 2018-06-10 DIAGNOSIS — D0461 Carcinoma in situ of skin of right upper limb, including shoulder: Secondary | ICD-10-CM | POA: Diagnosis not present

## 2018-06-10 DIAGNOSIS — Z85820 Personal history of malignant melanoma of skin: Secondary | ICD-10-CM | POA: Diagnosis not present

## 2018-06-10 DIAGNOSIS — D485 Neoplasm of uncertain behavior of skin: Secondary | ICD-10-CM | POA: Diagnosis not present

## 2018-06-10 DIAGNOSIS — L570 Actinic keratosis: Secondary | ICD-10-CM | POA: Diagnosis not present

## 2018-06-10 DIAGNOSIS — E119 Type 2 diabetes mellitus without complications: Secondary | ICD-10-CM | POA: Diagnosis not present

## 2018-06-10 DIAGNOSIS — Z86718 Personal history of other venous thrombosis and embolism: Secondary | ICD-10-CM | POA: Diagnosis not present

## 2018-06-10 DIAGNOSIS — Q211 Atrial septal defect: Secondary | ICD-10-CM | POA: Diagnosis not present

## 2018-06-10 DIAGNOSIS — I129 Hypertensive chronic kidney disease with stage 1 through stage 4 chronic kidney disease, or unspecified chronic kidney disease: Secondary | ICD-10-CM | POA: Diagnosis not present

## 2018-06-10 DIAGNOSIS — Z85828 Personal history of other malignant neoplasm of skin: Secondary | ICD-10-CM | POA: Diagnosis not present

## 2018-06-10 DIAGNOSIS — L820 Inflamed seborrheic keratosis: Secondary | ICD-10-CM | POA: Diagnosis not present

## 2018-06-10 DIAGNOSIS — I2510 Atherosclerotic heart disease of native coronary artery without angina pectoris: Secondary | ICD-10-CM | POA: Diagnosis not present

## 2018-06-10 DIAGNOSIS — D045 Carcinoma in situ of skin of trunk: Secondary | ICD-10-CM | POA: Diagnosis not present

## 2018-06-27 IMAGING — MR MR HEAD W/O CM
9 of 10 series · 35 of 48 positions shown · non-contrast
Comparison: Head CT 3696 hours today.

CLINICAL DATA: 70-year-old male who awoke with dizziness and
slurred speech today. Last known well 7911 hours yesterday.

EXAM:
MRI HEAD WITHOUT CONTRAST
TECHNIQUE: Multiplanar, multiecho pulse sequences of the brain and surrounding
structures were obtained without intravenous contrast.

[Series 3: DWI · axial · 3.0mm · 0.94mm/px · z∈[-47,+100]mm · 8 of 100 slices shown (1 of 2)]
[im 1/100]
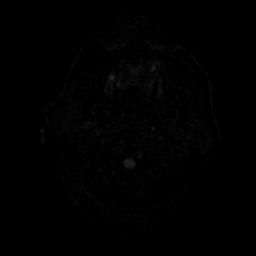
[im 12/100]
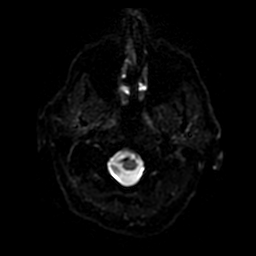
[im 34/100]
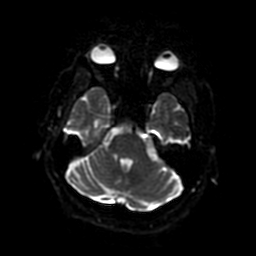
[im 45/100]
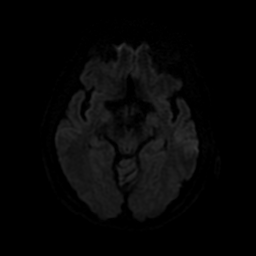
[im 56/100]
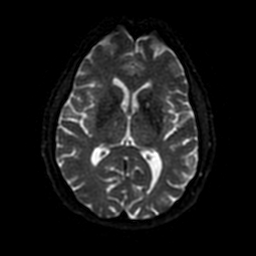
[im 67/100]
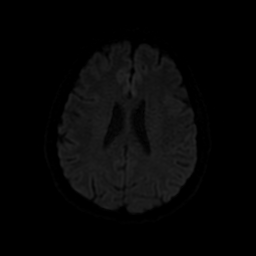
[im 89/100]
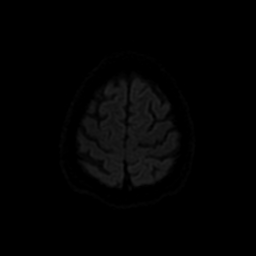
[im 100/100]
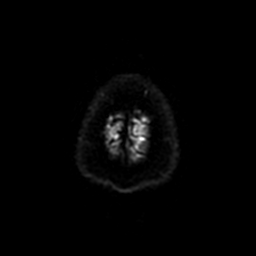

[Series 4: DWI · coronal · 4.0mm · 0.94mm/px · 7 of 71 slices shown (2 of 2)]
[im 1/71]
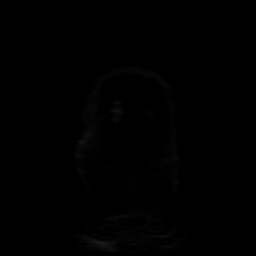
[im 12/71]
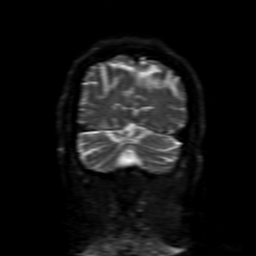
[im 24/71]
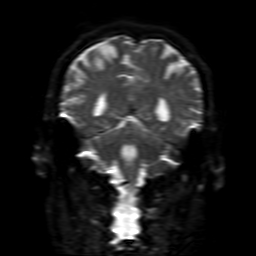
[im 36/71]
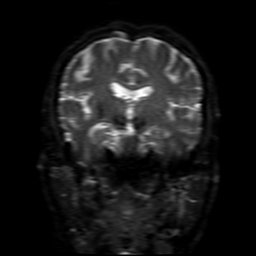
[im 47/71]
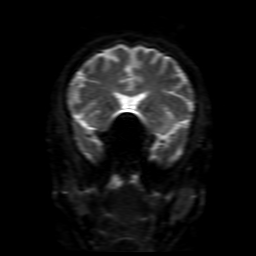
[im 59/71]
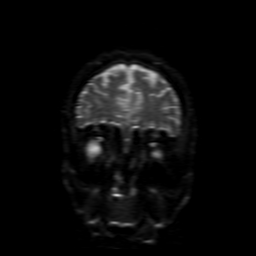
[im 71/71]
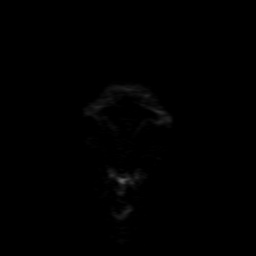

[Series 5: FLAIR · sagittal · 5.0mm · 0.47mm/px · 2 of 23 slices shown (1 of 2)]
[im 1/23]
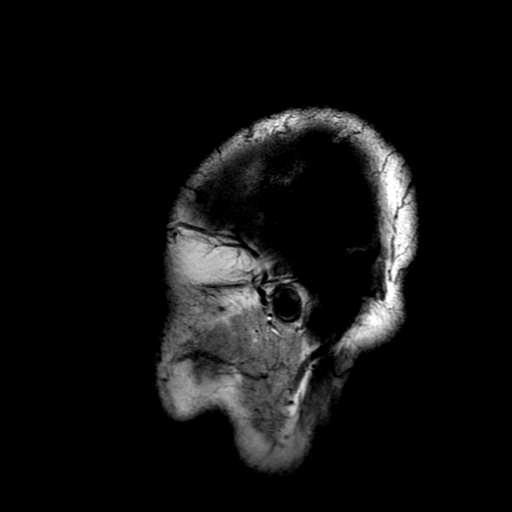
[im 23/23]
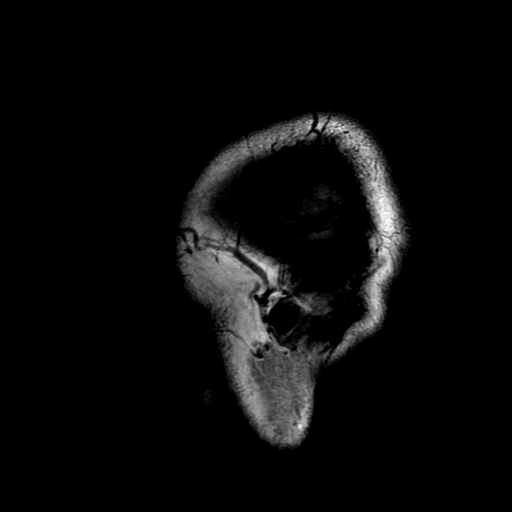

[Series 6: T2 · axial · 5.0mm · 0.47mm/px · z∈[-46,+98]mm · 2 of 25 slices shown (1 of 2)]
[im 1/25]
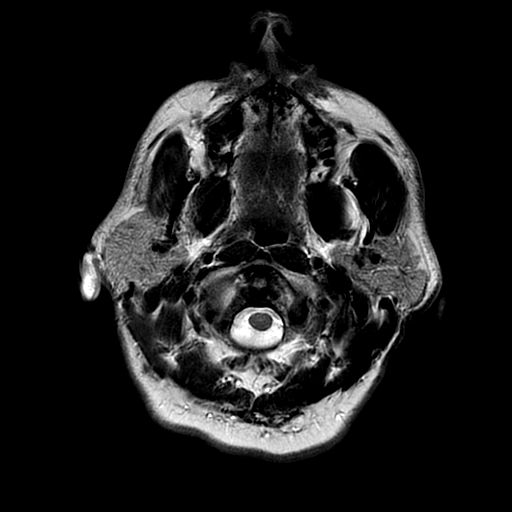
[im 25/25]
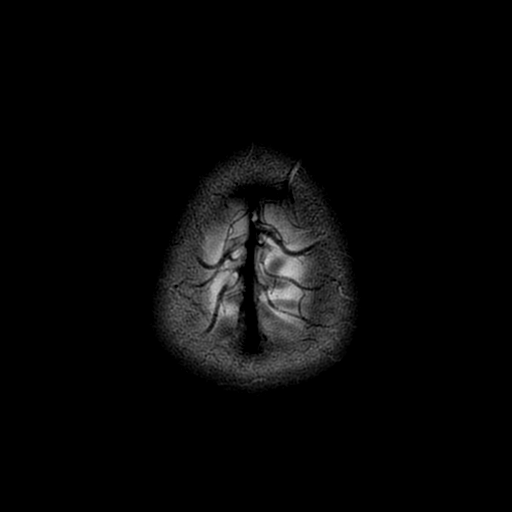

[Series 7: FLAIR · axial · 5.0mm · 0.47mm/px · z∈[-46,+98]mm · 2 of 25 slices shown (2 of 2)]
[im 1/25]
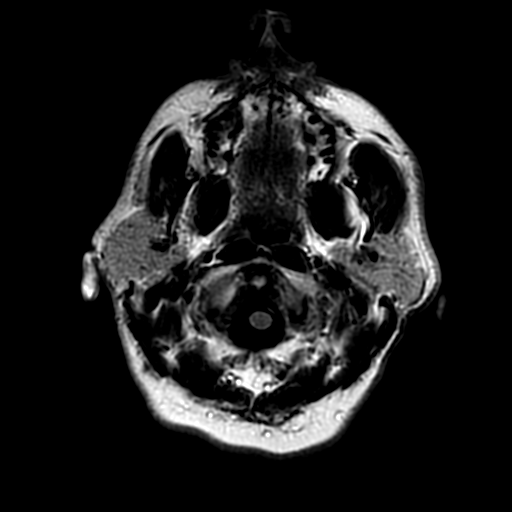
[im 25/25]
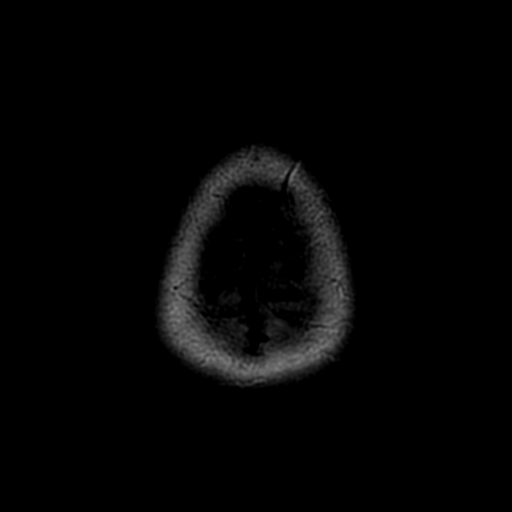

[Series 8: (person_name) · axial · 3.0mm · 0.47mm/px · z∈[-47,-11]mm · 3 of 100 slices shown]
[im 1/100]
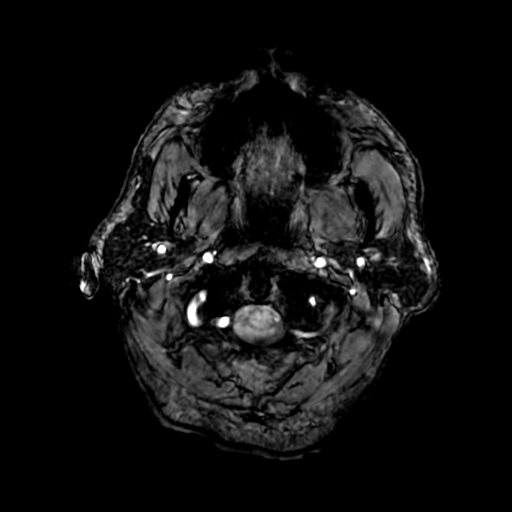
[im 13/100]
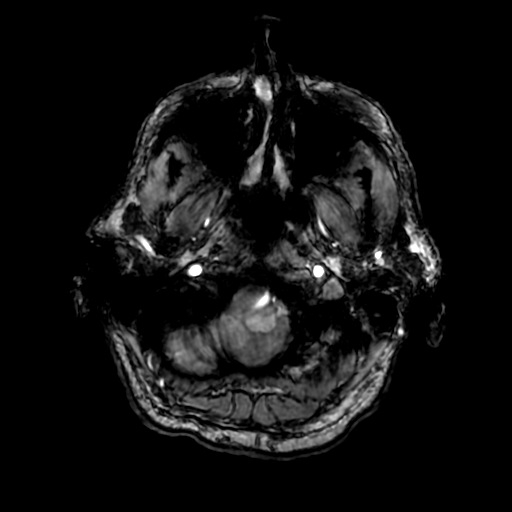
[im 25/100]
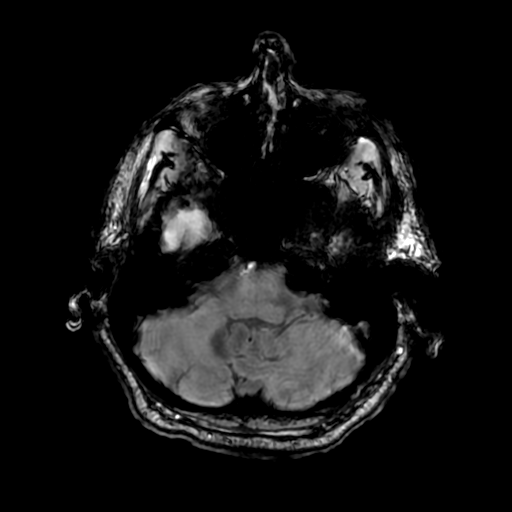

[Series 10: T2 · coronal · 5.0mm · 0.39mm/px · 3 of 30 slices shown (2 of 2)]
[im 1/30]
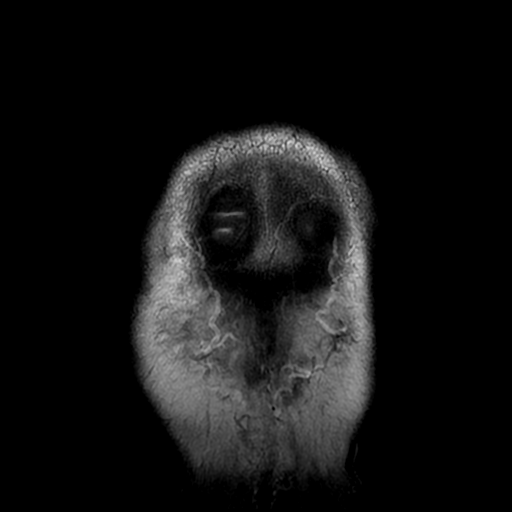
[im 15/30]
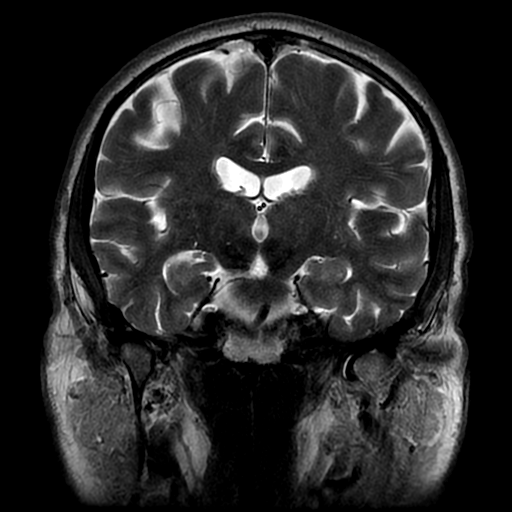
[im 30/30]
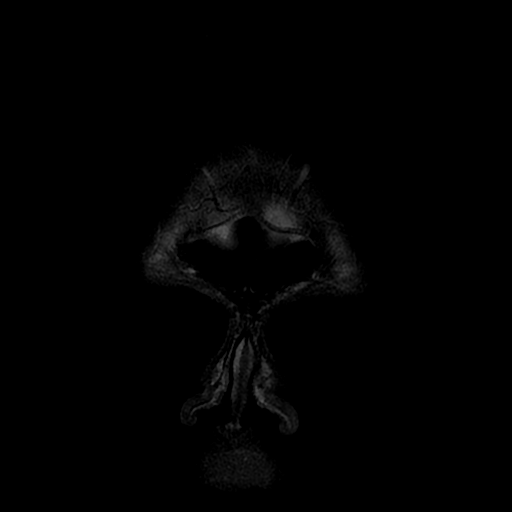

[Series 350: ADC · axial · 3.0mm · 0.94mm/px · z∈[-47,+100]mm · 5 of 50 slices shown (1 of 2)]
[im 1/50]
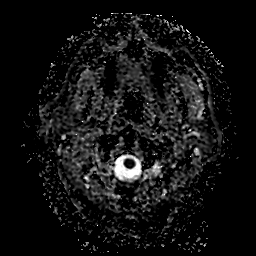
[im 13/50]
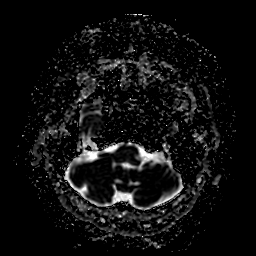
[im 25/50]
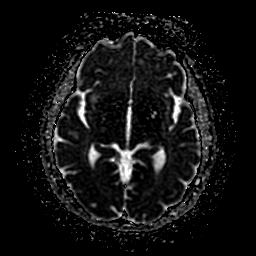
[im 37/50]
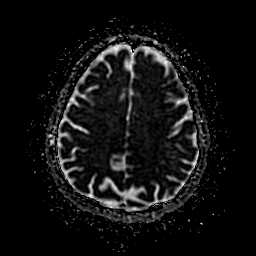
[im 50/50]
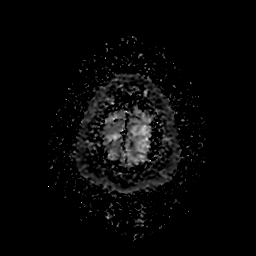

[Series 450: ADC · coronal · 4.0mm · 0.94mm/px · 3 of 36 slices shown (2 of 2)]
[im 1/36]
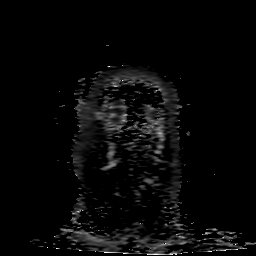
[im 18/36]
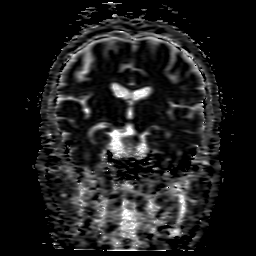
[im 36/36]
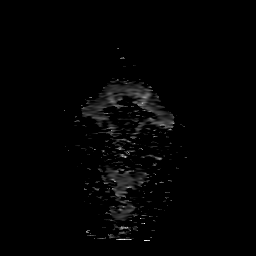

[35 of 48 positions shown; findings below may reference images not displayed]

FINDINGS: Brain: There is a linear focus of restricted diffusion along the
right inferior pons best seen on series 3, image 15. There is little
to no associated T2 and FLAIR hyperintensity at this time. No
associated hemorrhage or mass effect.

No other restricted diffusion. However, there is a chronic lacunar
infarct along the medial right thalamus (series 6, image 14).

Elsewhere gray and white matter signal is largely normal for age. No
cortical encephalomalacia or chronic cerebral blood products
identified. No restricted diffusion to suggest acute infarction. No
midline shift, mass effect, evidence of mass lesion,
ventriculomegaly, extra-axial collection or acute intracranial
hemorrhage. Cervicomedullary junction and pituitary are within
normal limits.

Vascular: Major intracranial vascular flow voids are preserved, the
distal right vertebral artery appears dominant.

Skull and upper cervical spine: Negative. Normal bone marrow signal.

Sinuses/Orbits: Normal orbits soft tissues.

Visualized paranasal sinuses and mastoids are stable and well
pneumatized.

Other: Visible internal auditory structures appear normal. Negative
scalp soft tissues.
IMPRESSION: 1. Small acute linear infarct in the right pons, Basilar Artery
perforator branch territory. No associated hemorrhage or mass
effect.
2. Small chronic lacunar infarct in the medial right thalamus.

## 2018-08-31 ENCOUNTER — Telehealth: Payer: Self-pay | Admitting: Adult Health

## 2018-08-31 NOTE — Telephone Encounter (Signed)
Pt called wanting to know if it would be ok to take [place] fish oil pills being on apixaban (ELIQUIS) 5 MG TABS tablet Please advise.

## 2018-09-01 NOTE — Telephone Encounter (Signed)
I called patient that per Jacob Billow NP she does not recommend taking  [place] fish oil pills due to the risk of increase bleeding. Pt verbalized understanding.

## 2018-09-01 NOTE — Telephone Encounter (Signed)
Fish oils can increase the anticoagulation effect of anticoagulants which can increase risk of bleeding.  It would not be recommended to initiate fish oils due to this risk

## 2018-09-02 ENCOUNTER — Other Ambulatory Visit: Payer: Self-pay | Admitting: Adult Health

## 2018-09-02 DIAGNOSIS — I6302 Cerebral infarction due to thrombosis of basilar artery: Secondary | ICD-10-CM

## 2018-09-16 ENCOUNTER — Encounter: Payer: Self-pay | Admitting: Adult Health

## 2018-09-16 ENCOUNTER — Ambulatory Visit (INDEPENDENT_AMBULATORY_CARE_PROVIDER_SITE_OTHER): Payer: Medicare Other | Admitting: Adult Health

## 2018-09-16 VITALS — BP 160/90 | HR 59 | Ht 69.0 in | Wt 182.0 lb

## 2018-09-16 DIAGNOSIS — E1159 Type 2 diabetes mellitus with other circulatory complications: Secondary | ICD-10-CM | POA: Diagnosis not present

## 2018-09-16 DIAGNOSIS — I10 Essential (primary) hypertension: Secondary | ICD-10-CM | POA: Diagnosis not present

## 2018-09-16 DIAGNOSIS — E785 Hyperlipidemia, unspecified: Secondary | ICD-10-CM

## 2018-09-16 DIAGNOSIS — I6302 Cerebral infarction due to thrombosis of basilar artery: Secondary | ICD-10-CM

## 2018-09-16 DIAGNOSIS — I82401 Acute embolism and thrombosis of unspecified deep veins of right lower extremity: Secondary | ICD-10-CM

## 2018-09-16 DIAGNOSIS — Q211 Atrial septal defect: Secondary | ICD-10-CM

## 2018-09-16 DIAGNOSIS — Q2112 Patent foramen ovale: Secondary | ICD-10-CM

## 2018-09-16 NOTE — Progress Notes (Signed)
STROKE NEUROLOGY FOLLOW UP NOTE  NAME: Jacob Hensley DOB: 02-09-1946  REASON FOR VISIT: stroke follow up HISTORY FROM: pt and chart  Today we had the pleasure of seeing Jacob Hensley in follow-up at our Neurology Clinic. Pt was accompanied by no one.   History Summary Jacob Hensley is a 73 y.o. male with history of  hypertension, hyperlipidemia, diet-controlled diabetes, CAD s/p CABG, remote DVT post CABG procedure admitted on 01/26/17 for dizziness, slurred speech and gait instability. MRI showed small acute linear infarct in the right pons. MRA, CUS, TTE all unremarkable. However, he was found to have right LE subacute DVT and spencer degree II PFO with valsalva maneuver. LDL 72 and A1C 6.7. 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 Due to his recurrent DVTs with PFO, he was put on eliquis for DVT and stroke prevention. Also continued ASA for cardiac prevention. crestor also continued. He was discharged in good condition.   History 03/03/17 During the interval time, the patient has been doing well.  He is very active, walking and swimming. His glucose in good control. BP fluctuates at home, but still within the goal. 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 He is on eliquis and ASA without bleeding. Bp today 151/88. He has not take his BP meds today.   History 09/15/17 Since previous visit, patient did have a fall on 04/30/2017 on Eliquis.  No acute intracranial abnormality was found.  Patient doing well overall.  Denies residual side effects from stroke on 01/26/2017.  Patient continues to take Eliquis which she is tolerating well.  Denies side effects of increased bleeding or bruising.  Her patient's last A1c 6.6.  Patient has been trying to eat healthier and avoid sugars and glucose levels have been ranging from 90s-150s.  PCP has been following glycemic control.  Patient  recently underwent ETT which showed excellent exercise tolerance and was unchanged from last year.  Blood pressure remains satisfactory at today's visit 138/72.  Patient states this is typically what it runs at home.  Patient continues to take Crestor and denies myalgias.  Patient denies new or worsening TIA stroke symptoms.   01/22/18 UPDATE: Patient was seen today for requested follow-up appointment to return to work.  Patient works as a Agricultural engineer and it was required to wait 1 year after having stroke in order to return to work.  Patient has been doing well from a neurological standpoint without residual deficits.  He continues to take Eliquis without side effects of bleeding or bruising.  Continues to take Crestor without side effects myalgias.  Blood pressure at today's appointment elevated for patient at 144/80 but patient does check BP at home and typically 110s to 120s over 60s to 70s.  Patient does regularly follow with PCP, urologist and cardiologist.  Denies new or worsening stroke/TIA symptoms at this time.  Interval history 09/16/2018: Jacob Hensley is a 73 year old male who is being seen today for stroke follow-up.  He has been stable from a stroke standpoint without residual deficits or recurring of symptoms.  He continues on Eliquis without side effects of bleeding or bruising due to history of LE DVT.  Continues on rosuvastatin without side effects myalgias.  He continues to follow with his PCP for routine monitoring of lipid panel.  Blood pressure today 160/90.  He does monitor at  home and typically SBP 1 30-1 40s but is concerned as he checked his BP level after activity and was elevated.  He does have appointment scheduled on Friday with his cardiologist.  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  Denies new or worsening stroke/TIA symptoms.     REVIEW OF SYSTEMS: Full 14 system review of systems performed and notable only  for those listed below and in HPI above, all others are negative: No complaints   PAST MEDICAL HISTORY Past Medical History:  Diagnosis Date  . BPH (benign prostatic hyperplasia)   . Coronary artery disease    CABG 2003-DR. CROITORU IS PT'S CARDIOLOGIST - LAST OFFICE VISIT 08/02/11-PT EXERCISES DAILY-NO C/O OF CHEST PAIN  . Diabetes mellitus    BORDERLINE - PT CHECKS HIS BLOOD SUGARS AT HOME - NO MEDS  . DVT of leg (deep venous thrombosis) ([place]) 2003   S/P CABG SURGERY  . Hyperlipidemia   . Hypertension   . Stroke ([place])   . Urine retention    PT STATES HE HAS BEEN DOING I&O CATHS FOR PAST 3 YRS    PAST SURGICAL HISTORY Past Surgical History:  Procedure Laterality Date  . CORONARY ARTERY BYPASS GRAFT  02/05/2002   LIMA to LAD,RIMA to PDA,SVG to diagonal & free left radial arter to the oblique marginal artery - Dr. Cyndia Bent  . CPET w/PFT  1//20/2014   Normal  . CYSTOSCOPY WITH BIOPSY  11/08/2011   Procedure: CYSTOSCOPY WITH BIOPSY;  Surgeon: Fredricka Bonine, MD;  Location: [facility];  Service: Urology;;  Bladder biopsies  . CYSTOSCOPY WITH BIOPSY  06/19/2012   Procedure: CYSTOSCOPY WITH BIOPSY;  Surgeon: Fredricka Bonine, MD;  Location: [facility];  Service: Urology;  Laterality: N/A;  fulgaration of bleeders  . myolema of back  2012  . TONSILLECTOMY    . TRANSURETHRAL RESECTION OF PROSTATE  11-08-2011  . US ECHOCARDIOGRAPHY  04/14/2009   EF >50%,borderline dilated RV w/normal systolic fx,mildly dilated LA,mild AI,MR,TR.   Family History  Problem Relation Age of Onset  . Cancer Mother        Breast  . Diabetes Father   . Heart failure Father     CURRENT MEDICATIONS Current Outpatient Medications on File Prior to Visit  Medication Sig Dispense Refill  . Ascorbic Acid (VITAMIN C) 1000 MG tablet Take 1,000 mg by mouth daily.    . cholecalciferol (VITAMIN D) 1000 units tablet Take 1,000 Units by mouth daily.    Marland Kitchen ELIQUIS 5 MG TABS tablet TAKE 1 TABLET BY MOUTH TWICE A DAY  180 tablet 1  . metoprolol (TOPROL-XL) 50 MG 24 hr tablet Take 50 mg by mouth daily before breakfast.     . Multiple Vitamins-Minerals (MULTIVITAMIN ADULT PO) Take by mouth.    . ramipril (ALTACE) 10 MG capsule Take 10 mg by mouth daily after breakfast.     . rosuvastatin (CRESTOR) 40 MG tablet Take 1 tablet (40 mg total) by mouth at bedtime. 90 tablet 3   No current facility-administered medications on file prior to visit.     SOCIAL HISTORY Social History   Socioeconomic History  . Marital status: Married    Spouse name: Not on file  . Number of children: Not on file  . Years of education: Not on file  . Highest education level: Not on file  Occupational History  . Not on file  Social Needs  . Financial  resource strain: Not on file  . Food insecurity:    Worry: Not on file    Inability: Not on file  . Transportation needs:    Medical: Not on file    Non-medical: Not on file  Tobacco Use  . Smoking status: Never Smoker  . Smokeless tobacco: Never Used  Substance and Sexual Activity  . Alcohol use: No  . Drug use: No  . Sexual activity: Not on file  Lifestyle  . Physical activity:    Days per week: Not on file    Minutes per session: Not on file  . Stress: Not on file  Relationships  . Social connections:    Talks on phone: Not on file    Gets together: Not on file    Attends religious service: Not on file    Active member of club or organization: Not on file    Attends meetings of clubs or organizations: Not on file    Relationship status: Not on file  Other Topics Concern  . Not on file  Social History Narrative  . Not on file   NKDA    Physical Exam:  Vitals:   09/16/18 1448  BP: (!) 160/90  Pulse: (!) 59    General: well developed, pleasant elderly caucasian male, well nourished, seated, in no evident distress Head: head normocephalic and atraumatic.   Neck: supple with no carotid or supraclavicular bruits Cardiovascular: regular rate and  rhythm, no murmurs Musculoskeletal: no deformity Skin:  no rash/petichiae Vascular:  Normal pulses all extremities   Neurologic Examination Mental Status -  Level of arousal and orientation to time, place, and person were intact. Language including expression, naming, repetition, comprehension was assessed and found intact. Fund of Knowledge was assessed and was intact.  Cranial Nerves II - XII - II - Visual field intact OU. III, IV, VI - Extraocular movements intact. V - Facial sensation intact bilaterally. VII - Facial movement intact bilaterally. VIII - Hearing & vestibular intact bilaterally. X - Palate elevates symmetrically. XI - Chin turning & shoulder shrug intact bilaterally. XII - Tongue protrusion intact  Motor Strength - The patient's strength was normal in all extremities and pronator drift was absent.  Bulk was normal and fasciculations were absent.   Motor Tone - Muscle tone was assessed at the neck and appendages and was normal.  Reflexes - The patient's reflexes were 2+ in all extremities and he had no pathological reflexes.  Sensory - Light touch, temperature/pinprick were assessed and were normal.    Coordination - The patient had normal movements in the hands and feet with no ataxia or dysmetria.  Tremor was absent.  Gait and Station - The patient's transfers, posture, gait, station, and turns were observed as normal.     Data reviewed: I personally reviewed the images and agree with the radiology interpretations.   Ct Head Wo Contrast 01/26/2017 IMPRESSION: Normal for age non contrast CT appearance of the brain.  [name] Head Wo Contrast 01/27/2017 IMPRESSION: No large vessel occlusion, aneurysm, or significant stenosis is identified. Unremarkable MRA of the head.  Mr Brain Wo Contrast (neuro Protocol) 01/26/2017 IMPRESSION: 1. Small acute linear infarct in the right pons, Basilar Artery perforator branch territory. No associated hemorrhage or mass  effect. 2. Small chronic lacunar infarct in the medial right thalamus.  CUS - Bilateral: 1-39% ICA stenosis. Vertebral artery flow is antegrade.  TTE - Left ventricle: The cavity size was normal. Wall thickness was increased in a pattern  of mild LVH. Systolic function was normal. The estimated ejection fraction was in the range of 60% to 65%. Doppler parameters are consistent with abnormal left ventricular relaxation (grade 1 diastolic dysfunction). - Aortic valve: There was mild regurgitation.  LE venous Doppler Right lower extremity is positivefor subacute versus age indeterminatedeep vein thrombosis involving the distal right femoral vein, right popliteal vein, right posterior tibial veins, right peroneal veins, and right gastrocnemius veins. There is no evidence of left lower extremity deep vein thrombosis orBaker's cyst bilaterally  TCD bubble study - High intensity transient signals (HITS) (17) were heard with Valsalva, suggestive of a small patent foramen ovale (PFO) with spencer degree II.  Component     Latest Ref Rng & Units 01/27/2017  Cholesterol     0 - 200 mg/dL 142  Triglycerides     <150 mg/dL 153 (H)  HDL Cholesterol     >40 mg/dL 39 (L)  Total CHOL/HDL Ratio     RATIO 3.6  VLDL     0 - 40 mg/dL 31  LDL (calc)     0 - 99 mg/dL 72  Hemoglobin A1C     4.8 - 5.6 % 6.7 (H)  Mean Plasma Glucose     mg/dL 146    Assessment: As you may recall, he is a 73 y.o. Caucasian male with PMH of hypertension, hyperlipidemia, diet-controlled diabetes, CAD s/p CABG, remote DVT post CABG procedure admitted on 01/26/17 for small acute linear infarct in the right pons. MRA, CUS, TTE all unremarkable. However, he was found to have right LE subacute DVT and spencer degree II PFO with valsalva maneuver. 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 Although the stroke felt likely due to small vessel disease, due to his recurrent DVTs with PFO, he  was put on eliquis for DVT and stroke prevention.  He returns today for follow-up visit and has been stable from a stroke standpoint without residual deficit or recurring of symptoms.  Plan: -Continue Eliquis (apixaban) daily  and Crestor for secondary stroke prevention -f/u with cardiologist regarding Eliquis management am prescribing for recurring DVTs with PFO -We will repeat lower extremity ultrasound to assess status of LE DVT -F/u with PCP regarding your cholesterol and blood pressure management -continue to monitor BP at home with recording with continued follow-up by cardiology -Advised patient to continue to stay active and eat a healthy diet -Maintain strict control of hypertension with blood pressure goal below 130/90, diabetes with hemoglobin A1c goal below 6.5% and cholesterol with LDL cholesterol (bad cholesterol) goal below 70 mg/dL. I also advised the patient to eat a healthy diet with plenty of whole grains, cereals, fruits and vegetables, exercise regularly and maintain ideal body weight.  As he has been stable from a stroke standpoint, follow-up as needed but advised to call with any questions concerns or need of future follow-up appointment  Greater than 50% of time during this 25 minute visit was spent on counseling,explanation of diagnosis of stroke, reviewing risk factor management of HTN, HLD and LE DVT with PFO, planning of further management, discussion with patient and family and coordination of care   Venancio Poisson, AGNP-BC  [facility] [address] [place], [place] [address]  Phone [phone] Fax [phone] Note: This document was prepared with digital dictation and possible smart phrase technology. Any transcriptional errors that result from this process are unintentional.

## 2018-09-16 NOTE — Patient Instructions (Addendum)
Continue Eliquis (apixaban) daily  and Crestor for secondary stroke prevention  Continue to follow up with PCP regarding cholesterol, diabetes and blood pressure management  We will repeat lower extremity ultrasound to assess DVT status  Continue to monitor blood pressure at home and follow up with cardiology for ongoing blood pressure management  Maintain strict control of hypertension with blood pressure goal below 130/90, diabetes with hemoglobin A1c goal below 6.5% and cholesterol with LDL cholesterol (bad cholesterol) goal below 70 mg/dL. I also advised the patient to eat a healthy diet with plenty of whole grains, cereals, fruits and vegetables, exercise regularly and maintain ideal body weight.  Followup in the future with me as needed or call earlier if needed       Thank you for coming to see [place] at [facility]. I hope we have been able to provide you high quality care today.  You may receive a patient satisfaction survey over the next few weeks. We would appreciate your feedback and comments so that we may continue to improve ourselves and the health of our patients.

## 2018-09-18 ENCOUNTER — Ambulatory Visit: Payer: Medicare Other | Admitting: Physician Assistant

## 2018-09-18 NOTE — Progress Notes (Signed)
I agree with the above plan 

## 2018-09-22 ENCOUNTER — Encounter: Payer: Self-pay | Admitting: Adult Health

## 2018-09-22 ENCOUNTER — Ambulatory Visit (INDEPENDENT_AMBULATORY_CARE_PROVIDER_SITE_OTHER): Payer: Medicare Other | Admitting: Adult Health

## 2018-09-22 VITALS — BP 142/74 | HR 61 | Ht 69.0 in | Wt 181.0 lb

## 2018-09-22 DIAGNOSIS — Z79899 Other long term (current) drug therapy: Secondary | ICD-10-CM | POA: Diagnosis not present

## 2018-09-22 DIAGNOSIS — I10 Essential (primary) hypertension: Secondary | ICD-10-CM

## 2018-09-22 DIAGNOSIS — Z951 Presence of aortocoronary bypass graft: Secondary | ICD-10-CM | POA: Diagnosis not present

## 2018-09-22 DIAGNOSIS — E785 Hyperlipidemia, unspecified: Secondary | ICD-10-CM

## 2018-09-22 DIAGNOSIS — R0789 Other chest pain: Secondary | ICD-10-CM | POA: Diagnosis not present

## 2018-09-22 DIAGNOSIS — Z0289 Encounter for other administrative examinations: Secondary | ICD-10-CM

## 2018-09-22 DIAGNOSIS — I2510 Atherosclerotic heart disease of native coronary artery without angina pectoris: Secondary | ICD-10-CM | POA: Diagnosis not present

## 2018-09-22 NOTE — Patient Instructions (Signed)
Medication Instructions:  NO CHANGES- Your physician recommends that you continue on your current medications as directed. Please refer to the Current Medication list given to you today. If you need a refill on your cardiac medications before your next appointment, please call your pharmacy.  Labwork: A1C,BMET,CBC,FLP AND LFT WHEN YOU COME IN FOR STRESS TEST HERE IN OUR OFFICE AT [facility]   You will need to fast. DO NOT EAT OR DRINK PAST MIDNIGHT.      Take the provided lab slips with you to the lab for your blood draw.   When you have your labs (blood work) drawn today and your tests are completely normal, you will receive your results only by MyChart Message (if you have MyChart) -OR-  A paper copy in the mail.  If you have any lab test that is abnormal or we need to change your treatment, we will call you to review these results.  Testing/Procedures: Your physician has requested that you have an Exercise Myoview. A cardiac stress test is a cardiological test that measures the heart's ability to respond to external stress in a controlled clinical environment. The stress response is induced by exercise (exercise-treadmill). For further information please visit [email or web address]. If you have questions or concerns about your appointment, you can call the Nuclear Lab at [phone].  Hold: METOPROLOL the day of the testing    Follow-Up: You will need a follow up appointment in June Declo Croitoru, MD Please call our office 2 months in advance April 2020 to schedule this June 2020 appointment.  You may see or one of the following Advanced Practice Providers on your designated Care Team:  Almyra Deforest, [place]  Fabian Sharp, PA-C  At [facility], you and your health needs are our priority.  As part of our continuing mission to provide you with exceptional heart care, we have created designated Provider Care Teams.  These Care Teams include your primary Cardiologist (physician) and  Advanced Practice Providers (APPs -  Physician Assistants and Nurse Practitioners) who all work together to provide you with the care you need, when you need it.  Thank you for choosing [facility] at [facility]!!

## 2018-09-22 NOTE — Progress Notes (Signed)
Cardiology Office Note   Date:  09/22/2018   ID:  Jacob Hensley, DOB 1946/02/24, MRN [id number]  PCP:  Deland Pretty, MD  Cardiologist:  Croitoru  Chief Complaint  Patient presents with  . Coronary Artery Disease  . Hypertension     History of Present Illness: Jacob Hensley is a 73 y.o. male who presents for ongoing assessment and management of CAD, with hx of CABG in 2003, HTN, HL, with other history to include Type II diabetes. He was able to perform a POET during last office visit on 08/02/2016 which was negative for EKG changes indicative for ischemia.   He comes today with multiple questions and admits to concerns that he is not doing enough to keep himself healthy. He states "I don't want to die."  He has several phone apps to monitor his HR, EKG and BP.   He works out 3-4 times a week at [employer] on the treadmill for a minimum of one hour. He denies chest pain or DOE other than normal exercise increased breathing. He is medically compliant and is watching his diet very strictly. 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   Past Medical History:  Diagnosis Date  . BPH (benign prostatic hyperplasia)   . Coronary artery disease    CABG 2003-DR. CROITORU IS PT'S CARDIOLOGIST - LAST OFFICE VISIT 08/02/11-PT EXERCISES DAILY-NO C/O OF CHEST PAIN  . Diabetes mellitus    BORDERLINE - PT CHECKS HIS BLOOD SUGARS AT HOME - NO MEDS  . DVT of leg (deep venous thrombosis) ([place]) 2003   S/P CABG SURGERY  . Hyperlipidemia   . Hypertension   . Stroke ([place])   . Urine retention    PT STATES HE HAS BEEN DOING I&O CATHS FOR PAST 3 YRS    Past Surgical History:  Procedure Laterality Date  . CORONARY ARTERY BYPASS GRAFT  02/05/2002   LIMA to LAD,RIMA to PDA,SVG to diagonal & free left radial arter to the oblique marginal artery - Dr. Cyndia Bent  . CPET w/PFT  1//20/2014   Normal  . CYSTOSCOPY WITH BIOPSY  11/08/2011   Procedure: CYSTOSCOPY  WITH BIOPSY;  Surgeon: Fredricka Bonine, MD;  Location: [facility];  Service: Urology;;  Bladder biopsies  . CYSTOSCOPY WITH BIOPSY  06/19/2012   Procedure: CYSTOSCOPY WITH BIOPSY;  Surgeon: Fredricka Bonine, MD;  Location: [facility];  Service: Urology;  Laterality: N/A;  fulgaration of bleeders  . myolema of back  2012  . TONSILLECTOMY    . TRANSURETHRAL RESECTION OF PROSTATE  11-08-2011  . US ECHOCARDIOGRAPHY  04/14/2009   EF >50%,borderline dilated RV w/normal systolic fx,mildly dilated LA,mild AI,MR,TR.     Current Outpatient Medications  Medication Sig Dispense Refill  . Ascorbic Acid (VITAMIN C) 1000 MG tablet Take 1,000 mg by mouth daily.    . cholecalciferol (VITAMIN D) 1000 units tablet Take 1,000 Units by mouth daily.    Marland Kitchen ELIQUIS 5 MG TABS tablet TAKE 1 TABLET BY MOUTH TWICE A DAY 180 tablet 1  . metoprolol (TOPROL-XL) 50 MG 24 hr tablet Take 50 mg by mouth daily before breakfast.     . Multiple Vitamins-Minerals (MULTIVITAMIN ADULT PO) Take by mouth.    . ramipril (ALTACE) 10 MG capsule Take 10 mg by mouth daily after breakfast.     . rosuvastatin (CRESTOR) 40 MG tablet Take 1 tablet (40 mg total) by mouth at bedtime. 90 tablet 3  No current facility-administered medications for this visit.     Allergies:   Patient has no known allergies.    Social History:  The patient  reports that he has never smoked. He has never used smokeless tobacco. He reports that he does not drink alcohol or use drugs.   Family History:  The patient's family history includes Cancer in his mother; Diabetes in his father; Heart failure in his father.    ROS: All other systems are reviewed and negative. Unless otherwise mentioned in H&P    PHYSICAL EXAM: VS:  BP (!) 142/74   Pulse 61   Ht 5\' 9"  (1.753 m)   Wt 181 lb (82.1 kg)   BMI 26.73 kg/m  , BMI Body mass index is 26.73 kg/m. GEN: Well nourished, well developed, in no acute distress HEENT: normal Neck: no JVD, carotid bruits,  or masses Cardiac: RRR; no murmurs, rubs, or gallops,no edema  Respiratory:  Clear to auscultation bilaterally, normal work of breathing GI: soft, nontender, nondistended, + BS MS: no deformity or atrophy Skin: warm and dry, no rash Neuro:  Strength and sensation are intact Psych: euthymic mood, full affect   EKG: NSR with 1st degree AV block. Rate of 61 bpm. (Unchanged from prior EKG 01/27/2017).   Recent Labs: No results found for requested labs within last 8760 hours.    Lipid Panel    Component Value Date/Time   CHOL 142 01/27/2017 0410   TRIG 153 (H) 01/27/2017 0410   HDL 39 (L) 01/27/2017 0410   CHOLHDL 3.6 01/27/2017 0410   VLDL 31 01/27/2017 0410   LDLCALC 72 01/27/2017 0410      Wt Readings from Last 3 Encounters:  09/22/18 181 lb (82.1 kg)  09/16/18 182 lb (82.6 kg)  01/22/18 172 lb 3.2 oz (78.1 kg)      Other studies Reviewed: Stress Test 09/09/2017.  Study Highlights    Blood pressure demonstrated a hypertensive response to exercise.  There was 58mm of horizontal ST segment depression in the inferolateral leads at peak exercise that persisted into recovery  Positive exercise stress test for ischemia but patient did have a hypertensive response to exercise which could also account for patient's ST changes.  Excellent exercise tolerance achieving 17.5 mets.  No Chest pain noted but occasional PVCs were noted during ETT.  Consider further testing with stress myoview   Echocardiogram 01/27/2017  Left ventricle: The cavity size was normal. Wall thickness was   increased in a pattern of mild LVH. Systolic function was normal.   The estimated ejection fraction was in the range of 60% to 65%.   Doppler parameters are consistent with abnormal left ventricular   relaxation (grade 1 diastolic dysfunction). - Aortic valve: There was mild regurgitation.  ASSESSMENT AND PLAN:  1. CAD: Hx of CABG in 2003. He is very strict on his diet and exercise routine. He is  medically compliant. He has a very real fear of dying from a heart attack as his CABG was so long ago. He offers no complaints of typical angina symptoms with or without exertion and exercises pretty strenuously at the gym. I have reviewed all of his EKG's from his phone and compared them to today's. Reassurance is given. Will repeat his stress test in June 2020 for his DOT physical.   2. Hypercholesterolemia: Will continue statin therapy. He will have fasting lipids and LFT's, prior to next office visit in June to discuss test results of stress test. Goal of LDL <  70 Last labs LDL was 72 in July of 2018.  3. Hypertension: He is concerned that his BP is starting to rise. Normally in the 037'C systolic, but has been up to 488'Q systolic over the last month. I have advised him to keep track of it. If continues to be elevated or rises more consistently will titrate up his medications and possibly do the stress test sooner.   4. Type II Diabetes: Being followed by endocrinologist.    Current medicines are reviewed at length with the patient today.  I have spent greater than 40 minutes with face to face encounter.   Labs/ tests ordered today include: Fasting lipids and LFT's, BMET, CBC, Hgb A1c, and NM stress test in June of 2020.   Phill Myron. West Pugh, ANP, AACC   09/22/2018 5:23 PM    [address] Office [phone] Fax [phone]

## 2018-09-23 ENCOUNTER — Telehealth: Payer: Self-pay | Admitting: Adult Health

## 2018-09-23 NOTE — Telephone Encounter (Signed)
I called vas lab at [facility] and spoke to [place]. I stated a order was put in Feb 19 for ultrasound of leg. Butch Penny stated it was not in their work quere. The Md or NP has to put location for them to get the order.She saw the order and will call pt. DOnna stated to always put location in order because if not indicated they cannot see the order.Message will be sent to [facility] NP.

## 2018-09-23 NOTE — Progress Notes (Signed)
It was almost like I was doing therapy. He said he had a confession to make and told me about his fears which have been prominent over that last few years. This is why he is so compulsive about his health. He states his father died in his early 46;s while undergoing CABG. He doesn't "want to go that way.  KL

## 2018-09-23 NOTE — Telephone Encounter (Signed)
Pt has called because he has not been contacted by anyone to schedule the ultrasound on his leg.  Pt states it was concerning that his AVS stated it was done on the date of his last appointment and he has not been contacted re: his DVT

## 2018-09-23 NOTE — Progress Notes (Signed)
OK, I wonder what else may have triggered his increased concern? MCr

## 2018-09-29 ENCOUNTER — Encounter (HOSPITAL_COMMUNITY): Payer: Medicare Other

## 2018-09-30 ENCOUNTER — Ambulatory Visit (HOSPITAL_COMMUNITY)
Admission: RE | Admit: 2018-09-30 | Discharge: 2018-09-30 | Disposition: A | Discharge status: Home / Self Care | Payer: Medicare Other | Source: Ambulatory Visit | Attending: Adult Health | Admitting: Adult Health

## 2018-09-30 DIAGNOSIS — I82401 Acute embolism and thrombosis of unspecified deep veins of right lower extremity: Secondary | ICD-10-CM | POA: Insufficient documentation

## 2018-10-07 ENCOUNTER — Telehealth: Payer: Self-pay

## 2018-10-07 NOTE — Telephone Encounter (Signed)
I called pt about vas ultrasound lower extremity. I stated the current ultrasound did not show a DVT. It did show a dvt in the previous ultrasound.I stated to continue eliquis and follow up with cardiology for ongoing management. Pt verbalized understanding. ------

## 2018-10-07 NOTE — Telephone Encounter (Signed)
-----   Message from Garvin Fila, MD sent at 10/07/2018 10:23 AM EDT ----- Mitchell Heir inform the patient that lower extremity venous Dopplers were normal and showed no evidence of deep vein thrombosis

## 2018-11-17 ENCOUNTER — Telehealth: Payer: Self-pay

## 2018-11-17 DIAGNOSIS — I2510 Atherosclerotic heart disease of native coronary artery without angina pectoris: Secondary | ICD-10-CM

## 2018-11-17 NOTE — Telephone Encounter (Signed)
Received a message from Dr.Croitoru requesting to change stress myoview to a plain treadmill.Order placed for a treadmill.Message sent to scheduler to change to a treadmill.

## 2018-11-20 ENCOUNTER — Encounter: Payer: Self-pay | Admitting: Cardiovascular Disease

## 2018-11-20 DIAGNOSIS — Z86718 Personal history of other venous thrombosis and embolism: Secondary | ICD-10-CM | POA: Diagnosis not present

## 2018-11-20 DIAGNOSIS — I6302 Cerebral infarction due to thrombosis of basilar artery: Secondary | ICD-10-CM | POA: Diagnosis not present

## 2018-11-20 DIAGNOSIS — Z7982 Long term (current) use of aspirin: Secondary | ICD-10-CM | POA: Diagnosis not present

## 2018-11-20 DIAGNOSIS — E559 Vitamin D deficiency, unspecified: Secondary | ICD-10-CM | POA: Diagnosis not present

## 2018-11-20 DIAGNOSIS — Z79899 Other long term (current) drug therapy: Secondary | ICD-10-CM | POA: Diagnosis not present

## 2018-11-20 DIAGNOSIS — E1122 Type 2 diabetes mellitus with diabetic chronic kidney disease: Secondary | ICD-10-CM | POA: Diagnosis not present

## 2018-11-20 DIAGNOSIS — I2510 Atherosclerotic heart disease of native coronary artery without angina pectoris: Secondary | ICD-10-CM | POA: Diagnosis not present

## 2018-11-20 DIAGNOSIS — E119 Type 2 diabetes mellitus without complications: Secondary | ICD-10-CM | POA: Diagnosis not present

## 2018-11-20 DIAGNOSIS — E785 Hyperlipidemia, unspecified: Secondary | ICD-10-CM | POA: Diagnosis not present

## 2018-11-20 DIAGNOSIS — I10 Essential (primary) hypertension: Secondary | ICD-10-CM | POA: Diagnosis not present

## 2018-11-25 DIAGNOSIS — I2510 Atherosclerotic heart disease of native coronary artery without angina pectoris: Secondary | ICD-10-CM | POA: Diagnosis not present

## 2018-11-25 DIAGNOSIS — N182 Chronic kidney disease, stage 2 (mild): Secondary | ICD-10-CM | POA: Diagnosis not present

## 2018-11-25 DIAGNOSIS — E785 Hyperlipidemia, unspecified: Secondary | ICD-10-CM | POA: Diagnosis not present

## 2018-11-25 DIAGNOSIS — E559 Vitamin D deficiency, unspecified: Secondary | ICD-10-CM | POA: Diagnosis not present

## 2018-11-25 DIAGNOSIS — I129 Hypertensive chronic kidney disease with stage 1 through stage 4 chronic kidney disease, or unspecified chronic kidney disease: Secondary | ICD-10-CM | POA: Diagnosis not present

## 2018-11-25 DIAGNOSIS — E1122 Type 2 diabetes mellitus with diabetic chronic kidney disease: Secondary | ICD-10-CM | POA: Diagnosis not present

## 2018-12-08 DIAGNOSIS — Z1211 Encounter for screening for malignant neoplasm of colon: Secondary | ICD-10-CM | POA: Diagnosis not present

## 2018-12-08 DIAGNOSIS — L814 Other melanin hyperpigmentation: Secondary | ICD-10-CM | POA: Diagnosis not present

## 2018-12-08 DIAGNOSIS — D1801 Hemangioma of skin and subcutaneous tissue: Secondary | ICD-10-CM | POA: Diagnosis not present

## 2018-12-08 DIAGNOSIS — Z85828 Personal history of other malignant neoplasm of skin: Secondary | ICD-10-CM | POA: Diagnosis not present

## 2018-12-08 DIAGNOSIS — K5730 Diverticulosis of large intestine without perforation or abscess without bleeding: Secondary | ICD-10-CM | POA: Diagnosis not present

## 2018-12-08 DIAGNOSIS — L570 Actinic keratosis: Secondary | ICD-10-CM | POA: Diagnosis not present

## 2018-12-08 DIAGNOSIS — Z85820 Personal history of malignant melanoma of skin: Secondary | ICD-10-CM | POA: Diagnosis not present

## 2018-12-08 DIAGNOSIS — L821 Other seborrheic keratosis: Secondary | ICD-10-CM | POA: Diagnosis not present

## 2018-12-08 DIAGNOSIS — Z86010 Personal history of colonic polyps: Secondary | ICD-10-CM | POA: Diagnosis not present

## 2018-12-09 DIAGNOSIS — Z86718 Personal history of other venous thrombosis and embolism: Secondary | ICD-10-CM | POA: Diagnosis not present

## 2018-12-09 DIAGNOSIS — I129 Hypertensive chronic kidney disease with stage 1 through stage 4 chronic kidney disease, or unspecified chronic kidney disease: Secondary | ICD-10-CM | POA: Diagnosis not present

## 2018-12-09 DIAGNOSIS — E119 Type 2 diabetes mellitus without complications: Secondary | ICD-10-CM | POA: Diagnosis not present

## 2018-12-09 DIAGNOSIS — E559 Vitamin D deficiency, unspecified: Secondary | ICD-10-CM | POA: Diagnosis not present

## 2018-12-09 DIAGNOSIS — I2510 Atherosclerotic heart disease of native coronary artery without angina pectoris: Secondary | ICD-10-CM | POA: Diagnosis not present

## 2018-12-09 DIAGNOSIS — Z7189 Other specified counseling: Secondary | ICD-10-CM | POA: Diagnosis not present

## 2018-12-09 DIAGNOSIS — E785 Hyperlipidemia, unspecified: Secondary | ICD-10-CM | POA: Diagnosis not present

## 2018-12-09 DIAGNOSIS — Q211 Atrial septal defect: Secondary | ICD-10-CM | POA: Diagnosis not present

## 2018-12-10 ENCOUNTER — Telehealth: Payer: Self-pay

## 2018-12-10 NOTE — Telephone Encounter (Signed)
Pt has been stable. Should be able to be cleared with a few questions about current status. His Eliquis is actually managed by his neurologist for hx of stroke.  I have left a message for pt to call back to discuss.

## 2018-12-10 NOTE — Telephone Encounter (Signed)
   [facility] Pre-operative Risk Assessment    Request for surgical clearance:  1. What type of surgery is being performed? colonoscopy  2. When is this surgery scheduled? 01/25/2019  3. What type of clearance is required (medical clearance vs. Pharmacy clearance to hold med vs. Both)? Both  4. Are there any medications that need to be held prior to surgery and how long? eliquis  5. Practice name and name of physician performing surgery? [facility] - Dr. Collene Mares  6. What is your office phone number [phone]   7.   What is your office fax number [phone]  8.   Anesthesia type (None, local, MAC, general) ? Propofol   Jacob Hensley 12/10/2018, 11:54 AM  _________________________________________________________________   (provider comments below)

## 2018-12-10 NOTE — Telephone Encounter (Signed)
   Primary Cardiologist: Sanda Klein, MD  Chart reviewed as part of pre-operative protocol coverage. Patient was contacted 12/10/2018 in reference to pre-operative risk assessment for pending surgery as outlined below.  CUYLER VANDYKEN was last seen on 2/25/200 by Jory Sims.  Since that day, DAVIAN HANSHAW has done very well. He walks ~15,000 steps per day with no exertional symptoms.   Therefore, based on ACC/AHA guidelines, the patient would be at acceptable risk for the planned procedure without further cardiovascular testing.   Regarding the holding of Eliquis, this medication is given for stroke and managed by neurology. Please contact Venancio Poisson, NP for permission to hold anticoagulation.   I will route this recommendation to the requesting party via Epic fax function and remove from pre-op pool.  Please call with questions.  Daune Perch, NP 12/10/2018, 3:26 PM

## 2018-12-22 ENCOUNTER — Telehealth: Payer: Self-pay

## 2018-12-22 NOTE — Telephone Encounter (Signed)
Clearance form fax to [facility] at [phone]. Form fax twice and confirmed.

## 2018-12-23 ENCOUNTER — Telehealth: Payer: Self-pay

## 2018-12-23 ENCOUNTER — Other Ambulatory Visit: Payer: Self-pay

## 2018-12-23 DIAGNOSIS — I2510 Atherosclerotic heart disease of native coronary artery without angina pectoris: Secondary | ICD-10-CM

## 2018-12-23 DIAGNOSIS — Z024 Encounter for examination for driving license: Secondary | ICD-10-CM

## 2018-12-23 DIAGNOSIS — Z01818 Encounter for other preprocedural examination: Secondary | ICD-10-CM

## 2018-12-23 NOTE — Telephone Encounter (Signed)
Follow up  ° ° °Pt is returning call  ° ° °Please call back  °

## 2018-12-23 NOTE — Telephone Encounter (Signed)
LM2CB-pt needs COVID-19 testily before GXT that is scheduled 6-3. Please schedule drive up lab testing appt for tomorrow or Friday. Make sure to inform pt that he will need to self quarantine from after testing until GXT appt

## 2018-12-24 ENCOUNTER — Other Ambulatory Visit (HOSPITAL_COMMUNITY)
Admission: RE | Admit: 2018-12-24 | Discharge: 2018-12-24 | Disposition: A | Discharge status: Home / Self Care | Payer: Medicare Other | Source: Ambulatory Visit | Attending: Physician Assistant | Admitting: Physician Assistant

## 2018-12-24 DIAGNOSIS — Z1159 Encounter for screening for other viral diseases: Secondary | ICD-10-CM | POA: Insufficient documentation

## 2018-12-25 ENCOUNTER — Telehealth (HOSPITAL_COMMUNITY): Payer: Self-pay | Admitting: *Deleted

## 2018-12-25 ENCOUNTER — Telehealth: Payer: Self-pay

## 2018-12-25 LAB — NOVEL CORONAVIRUS, NAA (HOSP ORDER, SEND-OUT TO REF LAB; TAT 18-24 HRS): SARS-CoV-2, NAA: NOT DETECTED

## 2018-12-25 NOTE — Telephone Encounter (Signed)
Close encounter 

## 2018-12-30 ENCOUNTER — Ambulatory Visit (HOSPITAL_COMMUNITY)
Admission: RE | Admit: 2018-12-30 | Discharge: 2018-12-30 | Disposition: A | Discharge status: Home / Self Care | Payer: Medicare Other | Source: Ambulatory Visit | Attending: Cardiovascular Disease | Admitting: Cardiovascular Disease

## 2018-12-30 ENCOUNTER — Encounter (HOSPITAL_COMMUNITY): Payer: Self-pay | Admitting: *Deleted

## 2018-12-30 ENCOUNTER — Encounter (HOSPITAL_COMMUNITY): Payer: Self-pay

## 2018-12-30 ENCOUNTER — Other Ambulatory Visit: Payer: Self-pay

## 2018-12-30 ENCOUNTER — Ambulatory Visit (HOSPITAL_COMMUNITY)
Admission: RE | Admit: 2018-12-30 | Discharge: 2018-12-30 | Disposition: A | Discharge status: Home / Self Care | Payer: Medicare Other | Source: Ambulatory Visit | Attending: Cardiology | Admitting: Cardiology

## 2018-12-30 DIAGNOSIS — I2510 Atherosclerotic heart disease of native coronary artery without angina pectoris: Secondary | ICD-10-CM | POA: Insufficient documentation

## 2018-12-30 DIAGNOSIS — Z0289 Encounter for other administrative examinations: Secondary | ICD-10-CM | POA: Diagnosis not present

## 2018-12-30 DIAGNOSIS — R0789 Other chest pain: Secondary | ICD-10-CM | POA: Diagnosis not present

## 2018-12-30 NOTE — Progress Notes (Unsigned)
Abnormal ETT was reviewed by Dr. Harrell Gave. Patient was given the ok to be discharged.

## 2019-01-01 ENCOUNTER — Telehealth: Payer: Self-pay | Admitting: *Deleted

## 2019-01-01 ENCOUNTER — Other Ambulatory Visit: Payer: Self-pay

## 2019-01-01 LAB — MYOCARDIAL PERFUSION IMAGING
Estimated workload: 16.5 METS
Exercise duration (min): 13 min
Exercise duration (sec): 31 s
MPHR: 148 {beats}/min
Peak HR: 141 {beats}/min
Percent HR: 95 %
Rest HR: 54 {beats}/min

## 2019-01-01 NOTE — Telephone Encounter (Signed)
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 Sallyanne Kuster)

## 2019-01-01 NOTE — Telephone Encounter (Signed)
Patient is returning call.  °

## 2019-01-04 ENCOUNTER — Telehealth: Payer: Self-pay | Admitting: Cardiovascular Disease

## 2019-01-04 NOTE — Telephone Encounter (Signed)
Left message to call back  

## 2019-01-04 NOTE — Telephone Encounter (Signed)
Patient is returning call.  °

## 2019-01-04 NOTE — Telephone Encounter (Signed)
Patient wants to get his stress test results

## 2019-01-04 NOTE — Telephone Encounter (Signed)
Pt updated with test results along with Kathryn's recommendations. Pt state he was concerned as he continue to experience a little chest discomforted. He report he had an episode last nigh but it quickly went away. Will route to MD for recommendations.    Notes recorded by Lendon Colonel, NP on 01/03/2019 at 5:33 PM EDT Reviewed stress test results. Similar to previous false positive EKG changes in stress tests of the past. Unless symptomatic would not change medication regimen

## 2019-01-05 ENCOUNTER — Other Ambulatory Visit: Payer: Self-pay

## 2019-01-05 LAB — EXERCISE TOLERANCE TEST
Estimated workload: 16.5 METS
Exercise duration (min): 13 min
Exercise duration (sec): 31 s
MPHR: 148 {beats}/min
Peak HR: 141 {beats}/min
Percent HR: 95 %
Rest HR: 54 {beats}/min

## 2019-01-05 NOTE — Telephone Encounter (Signed)
See my other note. I suspect the twinges are not cardiac.

## 2019-01-05 NOTE — Telephone Encounter (Signed)
Spoke with the patient. He stated that the discomfort was better and he has not felt any "twinges' in the last few days. [place] the beach.  If Dr. Sallyanne Kuster has any recommendations then he would appreciate them otherwise he will keep his appointment on 01/27/2019 and call back if anything further is needed.

## 2019-01-05 NOTE — Telephone Encounter (Signed)
Left a message for the patient to call back.  

## 2019-01-05 NOTE — Telephone Encounter (Signed)
Spoke with the patient. 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 He can access this through [place].

## 2019-01-05 NOTE — Telephone Encounter (Signed)
Yes, please. Can copy from last year. Please tell him that I saw his concern about chest pain, but I was reassured by how well he did on the treadmill test. We would have to do a cardiac cath to assess any deeper. But as long as he can perform physical exercise without chest pain, occasional random episodes of chest pain occurring at rest are unlikely to be cardiac. If they become more frequent and/or lengthy, we would go straight to cardiac cath.

## 2019-01-06 ENCOUNTER — Encounter: Payer: Self-pay | Admitting: *Deleted

## 2019-01-06 NOTE — Telephone Encounter (Signed)
NO

## 2019-01-06 NOTE — Telephone Encounter (Signed)
The letter has been sent to Jacob Hensley and a message informing him of Dr. Victorino December response. He has been advised to call if anything further is needed.

## 2019-01-15 DIAGNOSIS — Z79899 Other long term (current) drug therapy: Secondary | ICD-10-CM | POA: Diagnosis not present

## 2019-01-15 DIAGNOSIS — E785 Hyperlipidemia, unspecified: Secondary | ICD-10-CM | POA: Diagnosis not present

## 2019-01-16 LAB — LIPID PANEL
Chol/HDL Ratio: 2.8 ratio (ref 0.0–5.0)
Cholesterol, Total: 149 mg/dL (ref 100–199)
HDL: 54 mg/dL (ref >39)
LDL Calculated: 75 mg/dL (ref 0–99)
Triglycerides: 102 mg/dL (ref 0–149)
VLDL Cholesterol Cal: 20 mg/dL (ref 5–40)

## 2019-01-16 LAB — CBC
Hematocrit: 45 % (ref 37.5–51.0)
Hemoglobin: 15.6 g/dL (ref 13.0–17.7)
MCH: 29.7 pg (ref 26.6–33.0)
MCHC: 34.7 g/dL (ref 31.5–35.7)
MCV: 86 fL (ref 79–97)
Platelets: 121 10*3/uL — ABNORMAL LOW (ref 150–450)
RBC: 5.25 x10E6/uL (ref 4.14–5.80)
RDW: 13.1 % (ref 11.6–15.4)
WBC: 6 10*3/uL (ref 3.4–10.8)

## 2019-01-16 LAB — BASIC METABOLIC PANEL
BUN/Creatinine Ratio: 29 — ABNORMAL HIGH (ref 10–24)
BUN: 24 mg/dL (ref 8–27)
CO2: 22 mmol/L (ref 20–29)
Calcium: 9.3 mg/dL (ref 8.6–10.2)
Chloride: 102 mmol/L (ref 96–106)
Creatinine, Ser: 0.84 mg/dL (ref 0.76–1.27)
GFR calc Af Amer: 101 mL/min/{1.73_m2} (ref >59)
GFR calc non Af Amer: 87 mL/min/{1.73_m2} (ref >59)
Glucose: 111 mg/dL — ABNORMAL HIGH (ref 65–99)
Potassium: 4.7 mmol/L (ref 3.5–5.2)
Sodium: 139 mmol/L (ref 134–144)

## 2019-01-16 LAB — HEPATIC FUNCTION PANEL
ALT: 29 IU/L (ref 0–44)
AST: 28 IU/L (ref 0–40)
Albumin: 4.6 g/dL (ref 3.7–4.7)
Alkaline Phosphatase: 70 IU/L (ref 39–117)
Bilirubin Total: 0.5 mg/dL (ref 0.0–1.2)
Bilirubin, Direct: 0.16 mg/dL (ref 0.00–0.40)
Total Protein: 6.6 g/dL (ref 6.0–8.5)

## 2019-01-16 LAB — HEMOGLOBIN A1C
Est. average glucose Bld gHb Est-mCnc: 146 mg/dL
Hgb A1c MFr Bld: 6.7 % — ABNORMAL HIGH (ref 4.8–5.6)

## 2019-01-16 LAB — TSH: TSH: 1.24 u[IU]/mL (ref 0.450–4.500)

## 2019-01-25 DIAGNOSIS — D125 Benign neoplasm of sigmoid colon: Secondary | ICD-10-CM | POA: Diagnosis not present

## 2019-01-25 DIAGNOSIS — K635 Polyp of colon: Secondary | ICD-10-CM | POA: Diagnosis not present

## 2019-01-25 DIAGNOSIS — K5730 Diverticulosis of large intestine without perforation or abscess without bleeding: Secondary | ICD-10-CM | POA: Diagnosis not present

## 2019-01-25 DIAGNOSIS — Z1211 Encounter for screening for malignant neoplasm of colon: Secondary | ICD-10-CM | POA: Diagnosis not present

## 2019-01-25 DIAGNOSIS — D126 Benign neoplasm of colon, unspecified: Secondary | ICD-10-CM | POA: Diagnosis not present

## 2019-01-25 DIAGNOSIS — Z86010 Personal history of colonic polyps: Secondary | ICD-10-CM | POA: Diagnosis not present

## 2019-01-26 ENCOUNTER — Telehealth: Payer: Self-pay | Admitting: Cardiovascular Disease

## 2019-01-26 NOTE — Telephone Encounter (Signed)
New Message ° ° ° °Left message to confirm appt and get consent  °

## 2019-01-27 ENCOUNTER — Telehealth (INDEPENDENT_AMBULATORY_CARE_PROVIDER_SITE_OTHER): Payer: Medicare Other | Admitting: Cardiovascular Disease

## 2019-01-27 DIAGNOSIS — I2510 Atherosclerotic heart disease of native coronary artery without angina pectoris: Secondary | ICD-10-CM | POA: Diagnosis not present

## 2019-01-27 DIAGNOSIS — I82409 Acute embolism and thrombosis of unspecified deep veins of unspecified lower extremity: Secondary | ICD-10-CM

## 2019-01-27 DIAGNOSIS — E119 Type 2 diabetes mellitus without complications: Secondary | ICD-10-CM

## 2019-01-27 DIAGNOSIS — I10 Essential (primary) hypertension: Secondary | ICD-10-CM | POA: Diagnosis not present

## 2019-01-27 DIAGNOSIS — E785 Hyperlipidemia, unspecified: Secondary | ICD-10-CM | POA: Diagnosis not present

## 2019-01-27 NOTE — Patient Instructions (Signed)

## 2019-01-27 NOTE — Telephone Encounter (Signed)
Follow Up  Patient calling back to confirm the appointment. Missed the call yesterday, but he still wants to keep the appointment and consent to the appointment. Please call patient back.

## 2019-01-27 NOTE — Progress Notes (Signed)
Virtual Visit via Video Note   This visit type was conducted due to national recommendations for restrictions regarding the COVID-19 Pandemic (e.g. social distancing) in an effort to limit this patient's exposure and mitigate transmission in our community.  Due to his co-morbid illnesses, this patient is at least at moderate risk for complications without adequate follow up.  This format is felt to be most appropriate for this patient at this time.  All issues noted in this document were discussed and addressed.  A limited physical exam was performed with this format.  Please refer to the patient's chart for his consent to telehealth for [facility]   Date:  01/27/2019   ID:  Imagene Gurney, DOB Nov 24, 1945, MRN [id number]  Patient Location: Home Provider Location: Office  PCP:  Deland Pretty, MD  Cardiologist:  Sanda Klein, MD  Electrophysiologist:  None   Evaluation Performed:  Follow-Up Visit  Chief Complaint:  CAD s/p CABG  History of Present Illness:    Jacob Hensley is a 73 y.o. male with CAD s/p CABG 2003, diet-controlled diabetes mellitus, hypercholesterolemia, essential hypertension, who had a recent treadmill stress test.  He has occasional atypical chest discomfort at rest but never occurs during physical activity.  Today for example he walked 22,000 steps, [place]  He did not feel dyspnea or angina.  On his recent treadmill stress test he performed well, exercising for over 13 minutes, similar to previous stress tests.  As always, he has a "false positive" ST segment depression response, that has been present ever since his bypass surgery.  His most recent lipid profile is satisfactory.  His LDL cholesterol is very close to target at 75 and has a very good HDL at 54.  Glycemic control remains acceptable, although his A1c has increased a little bit to 6.7% (diet only).  He has not had any falls, injuries or bleeding  problems.  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    The patient does not have symptoms concerning for COVID-19 infection (fever, chills, cough, or new shortness of breath).    Past Medical History:  Diagnosis Date  . BPH (benign prostatic hyperplasia)   . Coronary artery disease    CABG 2003-DR. Cypher Paule IS PT'S CARDIOLOGIST - LAST OFFICE VISIT 08/02/11-PT EXERCISES DAILY-NO C/O OF CHEST PAIN  . Diabetes mellitus    BORDERLINE - PT CHECKS HIS BLOOD SUGARS AT HOME - NO MEDS  . DVT of leg (deep venous thrombosis) ([place]) 2003   S/P CABG SURGERY  . Hyperlipidemia   . Hypertension   . Stroke ([place])   . Urine retention    PT STATES HE HAS BEEN DOING I&O CATHS FOR PAST 3 YRS   Past Surgical History:  Procedure Laterality Date  . CORONARY ARTERY BYPASS GRAFT  02/05/2002   LIMA to LAD,RIMA to PDA,SVG to diagonal & free left radial arter to the oblique marginal artery - Dr. Cyndia Bent  . CPET w/PFT  1//20/2014   Normal  . CYSTOSCOPY WITH BIOPSY  11/08/2011   Procedure: CYSTOSCOPY WITH BIOPSY;  Surgeon: Fredricka Bonine, MD;  Location: [facility];  Service: Urology;;  Bladder biopsies  . CYSTOSCOPY WITH BIOPSY  06/19/2012   Procedure: CYSTOSCOPY WITH BIOPSY;  Surgeon: Fredricka Bonine, MD;  Location: [facility];  Service: Urology;  Laterality: N/A;  fulgaration of bleeders  .  myolema of back  2012  . TONSILLECTOMY    . TRANSURETHRAL RESECTION OF PROSTATE  11-08-2011  . US ECHOCARDIOGRAPHY  04/14/2009   EF >50%,borderline dilated RV w/normal systolic fx,mildly dilated LA,mild AI,MR,TR.     Current Meds  Medication Sig  . ELIQUIS 5 MG TABS tablet TAKE 1 TABLET BY MOUTH TWICE A DAY  . metoprolol (TOPROL-XL) 50 MG 24 hr tablet Take 50 mg by mouth daily before breakfast.   . Multiple Vitamins-Minerals (MULTIVITAMIN ADULT PO) Take by mouth.  . ramipril (ALTACE) 10 MG capsule  Take 10 mg by mouth daily after breakfast.   . rosuvastatin (CRESTOR) 40 MG tablet Take 1 tablet (40 mg total) by mouth at bedtime.     Allergies:   Patient has no known allergies.   Social History   Tobacco Use  . Smoking status: Never Smoker  . Smokeless tobacco: Never Used  Substance Use Topics  . Alcohol use: No  . Drug use: No     Family Hx: The patient's family history includes Cancer in his mother; Diabetes in his father; Heart failure in his father.  ROS:   Please see the history of present illness.     All other systems reviewed and are negative.   Prior CV studies:   The following studies were reviewed today:  Treadmill stress test 01/05/2019  Labs/Other Tests and Data Reviewed:    EKG:  An ECG dated  01/05/2019 was personally reviewed today and demonstrated:  Sinus bradycardia, normal tracing  Recent Labs: 01/15/2019: ALT 29; BUN 24; Creatinine, Ser 0.84; Hemoglobin 15.6; Platelets 121; Potassium 4.7; Sodium 139; TSH 1.240   Recent Lipid Panel Lab Results  Component Value Date/Time   CHOL 149 01/15/2019 12:48 PM   TRIG 102 01/15/2019 12:48 PM   HDL 54 01/15/2019 12:48 PM   CHOLHDL 2.8 01/15/2019 12:48 PM   CHOLHDL 3.6 01/27/2017 04:10 AM   LDLCALC 75 01/15/2019 12:48 PM    Wt Readings from Last 3 Encounters:  01/27/19 175 lb (79.4 kg)  09/22/18 181 lb (82.1 kg)  09/16/18 182 lb (82.6 kg)     Objective:    Vital Signs:  BP 139/90   Pulse (!) 49   Ht 5\' 9"  (1.753 m)   Wt 175 lb (79.4 kg)   BMI 25.84 kg/m    VITAL SIGNS:  reviewed GEN:  no acute distress EYES:  sclerae anicteric, EOMI - Extraocular Movements Intact RESPIRATORY:  normal respiratory effort, symmetric expansion CARDIOVASCULAR:  no peripheral edema SKIN:  no rash, lesions or ulcers. MUSCULOSKELETAL:  no obvious deformities. NEURO:  alert and oriented x 3, no obvious focal deficit PSYCH:  normal affect  ASSESSMENT & PLAN:    1. CAD s/p CABG: He will always have a "false  positive" stress ECG.  However, the treadmill test is useful to compare his functional status over time.  He is still doing very well, much better than the average gentleman his age.  His symptoms are never exertional and I do not think his occasional chest discomfort represents angina pectoris.  He is on beta-blocker and statin and a high dose.  His LDL cholesterol is very close to target.  He is not taking aspirin since he is on Eliquis.  2. HTN: His blood pressure was high today, but he showed me recent readings on his home monitor, with a blood pressure that has typically been 120s/70-75.  I asked him to recheck his blood pressure 10-20 minutes after he hangs up with  me.  I suspect it's a form of whitecoat hypertension. 3. HLP: His LDL cholesterol is very close to target and he is on the maximum usual dose of rosuvastatin.  I do not think we need to add more medications.  He is very physically active and is minimally overweight.  4. DM: Although his hemoglobin A1c is in acceptable range, I agree that he can do better by limiting his intake of carbohydrates.  Continue his good regimen of physical exercise. 5. Recurrent DVT: No symptoms of recent recurrence.  On lifelong Eliquis.  6. Hx of stroke: He has a patent foramen ovale and had a small pontine infarct in July 2018, felt to be paradoxical embolism since he also had a subacute right lower extremity DVT at that time.  7. Anticoagulation: Well-tolerated without bleeding complications.  COVID-19 Education: The signs and symptoms of COVID-19 were discussed with the patient and how to seek care for testing (follow up with PCP or arrange E-visit).  The importance of social distancing was discussed today.  Time:   Today, I have spent 22 minutes with the patient with telehealth technology discussing the above problems.     Medication Adjustments/Labs and Tests Ordered: Current medicines are reviewed at length with the patient today.  Concerns  regarding medicines are outlined above.   Tests Ordered: No orders of the defined types were placed in this encounter.   Medication Changes: No orders of the defined types were placed in this encounter.  Patient Instructions  Medication Instructions:  Your physician recommends that you continue on your current medications as directed. Please refer to the Current Medication list given to you today.  If you need a refill on your cardiac medications before your next appointment, please call your pharmacy.   Lab work: None ordered  Testing/Procedures: None ordered  Follow-Up: At [facility], you and your health needs are our priority.  As part of our continuing mission to provide you with exceptional heart care, we have created designated Provider Care Teams.  These Care Teams include your primary Cardiologist (physician) and Advanced Practice Providers (APPs -  Physician Assistants and Nurse Practitioners) who all work together to provide you with the care you need, when you need it. You will need a follow up appointment in 12 months.  Please call our office 2 months in advance to schedule this appointment.  You may see Sanda Klein, MD or one of the following Advanced Practice Providers on your designated Care Team: Almyra Deforest, [place] Fabian Sharp, [place]        Follow Up:  Virtual Visit or In Person 1 year  Signed, Sanda Klein, MD  01/27/2019 5:24 PM    [place]

## 2019-02-15 DIAGNOSIS — N5201 Erectile dysfunction due to arterial insufficiency: Secondary | ICD-10-CM | POA: Diagnosis not present

## 2019-02-15 DIAGNOSIS — C672 Malignant neoplasm of lateral wall of bladder: Secondary | ICD-10-CM | POA: Diagnosis not present

## 2019-02-15 DIAGNOSIS — N400 Enlarged prostate without lower urinary tract symptoms: Secondary | ICD-10-CM | POA: Diagnosis not present

## 2019-02-24 ENCOUNTER — Telehealth: Payer: Self-pay | Admitting: *Deleted

## 2019-02-24 NOTE — Telephone Encounter (Signed)
Received from [facility] a denial  MCR part D for eliquis 5mg .  Denied for tier exception. If requesting lower cost for a brand name med, you must have tried 2 brand name formulary alternatives on lower cost share tiers to treat your condition.  We are denying the request for a lower copay because there are no brand formulary alternatives on any lower cost share tier. I called [facility] pharmacy, spoke to Corene Cornea [phone] he stated they mailed 90day supply on 02-19-19 for $119.00.

## 2019-03-08 DIAGNOSIS — E559 Vitamin D deficiency, unspecified: Secondary | ICD-10-CM | POA: Diagnosis not present

## 2019-03-08 DIAGNOSIS — E119 Type 2 diabetes mellitus without complications: Secondary | ICD-10-CM | POA: Diagnosis not present

## 2019-03-10 DIAGNOSIS — N400 Enlarged prostate without lower urinary tract symptoms: Secondary | ICD-10-CM | POA: Diagnosis not present

## 2019-03-11 DIAGNOSIS — Z7189 Other specified counseling: Secondary | ICD-10-CM | POA: Diagnosis not present

## 2019-03-11 DIAGNOSIS — I2510 Atherosclerotic heart disease of native coronary artery without angina pectoris: Secondary | ICD-10-CM | POA: Diagnosis not present

## 2019-03-11 DIAGNOSIS — Q211 Atrial septal defect: Secondary | ICD-10-CM | POA: Diagnosis not present

## 2019-03-11 DIAGNOSIS — E559 Vitamin D deficiency, unspecified: Secondary | ICD-10-CM | POA: Diagnosis not present

## 2019-03-11 DIAGNOSIS — E119 Type 2 diabetes mellitus without complications: Secondary | ICD-10-CM | POA: Diagnosis not present

## 2019-03-11 DIAGNOSIS — Z86718 Personal history of other venous thrombosis and embolism: Secondary | ICD-10-CM | POA: Diagnosis not present

## 2019-03-11 DIAGNOSIS — E785 Hyperlipidemia, unspecified: Secondary | ICD-10-CM | POA: Diagnosis not present

## 2019-03-11 DIAGNOSIS — I129 Hypertensive chronic kidney disease with stage 1 through stage 4 chronic kidney disease, or unspecified chronic kidney disease: Secondary | ICD-10-CM | POA: Diagnosis not present

## 2019-04-04 ENCOUNTER — Encounter (HOSPITAL_COMMUNITY): Payer: Self-pay | Admitting: Emergency Medicine

## 2019-04-04 ENCOUNTER — Other Ambulatory Visit: Payer: Self-pay

## 2019-04-04 ENCOUNTER — Emergency Department (HOSPITAL_COMMUNITY)
Admission: EM | Admit: 2019-04-04 | Discharge: 2019-04-04 | Disposition: A | Discharge status: Home / Self Care | Payer: Medicare Other | Attending: Emergency Medicine | Admitting: Emergency Medicine

## 2019-04-04 ENCOUNTER — Emergency Department (HOSPITAL_COMMUNITY): Payer: Medicare Other

## 2019-04-04 DIAGNOSIS — K5792 Diverticulitis of intestine, part unspecified, without perforation or abscess without bleeding: Secondary | ICD-10-CM | POA: Diagnosis not present

## 2019-04-04 DIAGNOSIS — M5137 Other intervertebral disc degeneration, lumbosacral region: Secondary | ICD-10-CM | POA: Diagnosis not present

## 2019-04-04 DIAGNOSIS — I10 Essential (primary) hypertension: Secondary | ICD-10-CM | POA: Diagnosis not present

## 2019-04-04 DIAGNOSIS — K598 Other specified functional intestinal disorders: Secondary | ICD-10-CM | POA: Diagnosis not present

## 2019-04-04 DIAGNOSIS — I2510 Atherosclerotic heart disease of native coronary artery without angina pectoris: Secondary | ICD-10-CM | POA: Diagnosis not present

## 2019-04-04 DIAGNOSIS — Z79899 Other long term (current) drug therapy: Secondary | ICD-10-CM | POA: Insufficient documentation

## 2019-04-04 DIAGNOSIS — I700 Atherosclerosis of aorta: Secondary | ICD-10-CM | POA: Diagnosis not present

## 2019-04-04 DIAGNOSIS — K449 Diaphragmatic hernia without obstruction or gangrene: Secondary | ICD-10-CM | POA: Diagnosis not present

## 2019-04-04 DIAGNOSIS — K4020 Bilateral inguinal hernia, without obstruction or gangrene, not specified as recurrent: Secondary | ICD-10-CM | POA: Diagnosis not present

## 2019-04-04 DIAGNOSIS — R1030 Lower abdominal pain, unspecified: Secondary | ICD-10-CM | POA: Diagnosis present

## 2019-04-04 DIAGNOSIS — K5730 Diverticulosis of large intestine without perforation or abscess without bleeding: Secondary | ICD-10-CM | POA: Diagnosis not present

## 2019-04-04 DIAGNOSIS — M4317 Spondylolisthesis, lumbosacral region: Secondary | ICD-10-CM | POA: Diagnosis not present

## 2019-04-04 DIAGNOSIS — K5732 Diverticulitis of large intestine without perforation or abscess without bleeding: Secondary | ICD-10-CM | POA: Diagnosis not present

## 2019-04-04 LAB — CBC
HCT: 45.8 % (ref 39.0–52.0)
Hemoglobin: 15.5 g/dL (ref 13.0–17.0)
MCH: 29.7 pg (ref 26.0–34.0)
MCHC: 33.8 g/dL (ref 30.0–36.0)
MCV: 87.7 fL (ref 80.0–100.0)
Platelets: 127 10*3/uL — ABNORMAL LOW (ref 150–400)
RBC: 5.22 MIL/uL (ref 4.22–5.81)
RDW: 13.2 % (ref 11.5–15.5)
WBC: 12 10*3/uL — ABNORMAL HIGH (ref 4.0–10.5)
nRBC: 0 % (ref 0.0–0.2)

## 2019-04-04 LAB — COMPREHENSIVE METABOLIC PANEL
ALT: 18 U/L (ref 0–44)
AST: 18 U/L (ref 15–41)
Albumin: 3.7 g/dL (ref 3.5–5.0)
Alkaline Phosphatase: 50 U/L (ref 38–126)
Anion gap: 10 (ref 5–15)
BUN: 20 mg/dL (ref 8–23)
CO2: 25 mmol/L (ref 22–32)
Calcium: 9.2 mg/dL (ref 8.9–10.3)
Chloride: 98 mmol/L (ref 98–111)
Creatinine, Ser: 1.02 mg/dL (ref 0.61–1.24)
GFR calc Af Amer: >60 mL/min (ref >60)
GFR calc non Af Amer: >60 mL/min (ref >60)
Glucose, Bld: 163 mg/dL — ABNORMAL HIGH (ref 70–99)
Potassium: 3.9 mmol/L (ref 3.5–5.1)
Sodium: 133 mmol/L — ABNORMAL LOW (ref 135–145)
Total Bilirubin: 1.1 mg/dL (ref 0.3–1.2)
Total Protein: 7.6 g/dL (ref 6.5–8.1)

## 2019-04-04 LAB — URINALYSIS, ROUTINE W REFLEX MICROSCOPIC
Bacteria, UA: NONE SEEN
Bilirubin Urine: NEGATIVE
Glucose, UA: NEGATIVE mg/dL
Hgb urine dipstick: NEGATIVE
Ketones, ur: 5 mg/dL — AB
Leukocytes,Ua: NEGATIVE
Nitrite: NEGATIVE
Protein, ur: 100 mg/dL — AB
Specific Gravity, Urine: 1.023 (ref 1.005–1.030)
pH: 5 (ref 5.0–8.0)

## 2019-04-04 LAB — LIPASE, BLOOD: Lipase: 23 U/L (ref 11–51)

## 2019-04-04 MED ORDER — HYDROCODONE-ACETAMINOPHEN 5-325 MG PO TABS: 1.0000 | ORAL_TABLET | Freq: Four times a day (QID) | ORAL | 0 refills | Status: DC | PRN

## 2019-04-04 MED ORDER — SENNOSIDES-DOCUSATE SODIUM 8.6-50 MG PO TABS: 2.0000 | ORAL_TABLET | Freq: Every day | ORAL | 0 refills | Status: AC

## 2019-04-04 MED ORDER — ONDANSETRON HCL 4 MG/2ML IJ SOLN
4.0000 mg | Freq: Once | INTRAMUSCULAR | Status: AC
  Administered 2019-04-04: 4 mg via INTRAVENOUS
  Filled 2019-04-04: qty 2

## 2019-04-04 MED ORDER — CIPROFLOXACIN IN D5W 400 MG/200ML IV SOLN
400.0000 mg | Freq: Once | INTRAVENOUS | Status: AC
  Administered 2019-04-04: 400 mg via INTRAVENOUS
  Filled 2019-04-04: qty 200

## 2019-04-04 MED ORDER — METRONIDAZOLE IN NACL 5-0.79 MG/ML-% IV SOLN
500.0000 mg | Freq: Once | INTRAVENOUS | Status: AC
  Administered 2019-04-04: 500 mg via INTRAVENOUS
  Filled 2019-04-04: qty 100

## 2019-04-04 MED ORDER — HYDROCODONE-ACETAMINOPHEN 5-325 MG PO TABS
1.0000 | ORAL_TABLET | Freq: Once | ORAL | Status: AC
  Administered 2019-04-04: 1 via ORAL
  Filled 2019-04-04: qty 1

## 2019-04-04 MED ORDER — CIPROFLOXACIN HCL 500 MG PO TABS: 500.0000 mg | ORAL_TABLET | Freq: Two times a day (BID) | ORAL | 0 refills | Status: DC

## 2019-04-04 MED ORDER — METRONIDAZOLE 500 MG PO TABS: 500.0000 mg | ORAL_TABLET | Freq: Two times a day (BID) | ORAL | 0 refills | Status: DC

## 2019-04-04 MED ORDER — SODIUM CHLORIDE 0.9 % IV BOLUS (SEPSIS)
1000.0000 mL | Freq: Once | INTRAVENOUS | Status: AC
  Administered 2019-04-04: 1000 mL via INTRAVENOUS

## 2019-04-04 MED ORDER — IOHEXOL 300 MG/ML  SOLN
100.0000 mL | Freq: Once | INTRAMUSCULAR | Status: AC | PRN
  Administered 2019-04-04: 100 mL via INTRAVENOUS

## 2019-04-04 MED ORDER — SENNOSIDES-DOCUSATE SODIUM 8.6-50 MG PO TABS: 2.0000 | ORAL_TABLET | Freq: Every day | ORAL | 0 refills | Status: DC

## 2019-04-04 MED ORDER — FENTANYL CITRATE (PF) 100 MCG/2ML IJ SOLN
50.0000 ug | Freq: Once | INTRAMUSCULAR | Status: AC
  Administered 2019-04-04: 03:00:00 50 ug via INTRAVENOUS
  Filled 2019-04-04: qty 2

## 2019-04-04 MED ORDER — FENTANYL CITRATE (PF) 100 MCG/2ML IJ SOLN
50.0000 ug | Freq: Once | INTRAMUSCULAR | Status: AC
  Administered 2019-04-04: 50 ug via INTRAVENOUS
  Filled 2019-04-04: qty 2

## 2019-04-04 MED ORDER — SODIUM CHLORIDE 0.9% FLUSH: 3.0000 mL | Freq: Once | INTRAVENOUS | Status: DC

## 2019-04-04 NOTE — Discharge Instructions (Signed)
As discussed, with your diagnosis of diverticulitis it is important that you monitor your condition carefully, and do not hesitate to return here. Otherwise, please be sure to follow-up with your gastroenterologist.

## 2019-04-04 NOTE — ED Provider Notes (Signed)
TIME SEEN: 3:06 AM  CHIEF COMPLAINT: Abdominal pain  HPI: Patient is a 73 year old male with history of hypertension, hyperlipidemia, diabetes, CAD status post CABG, previous DVT, stroke who presents to the emergency department with complaints of lower abdominal cramping and discomfort over the past 3 days.  Reports last bowel movement 3 to 4 days ago.  Was able to pass gas tonight without significant relief of symptoms.  No fevers, cough, chest pain or shortness of breath, nausea, vomiting, diarrhea, bloody stools or melena.  No previous history of abdominal surgery.  Has tried over-the-counter medications for constipation without relief.  ROS: See HPI Constitutional: no fever  Eyes: no drainage  ENT: no runny nose   Cardiovascular:  no chest pain  Resp: no SOB  GI: no vomiting GU: no dysuria Integumentary: no rash  Allergy: no hives  Musculoskeletal: no leg swelling  Neurological: no slurred speech ROS otherwise negative  PAST MEDICAL HISTORY/PAST SURGICAL HISTORY:  Past Medical History:  Diagnosis Date  . BPH (benign prostatic hyperplasia)   . Coronary artery disease    CABG 2003-DR. CROITORU IS PT'S CARDIOLOGIST - LAST OFFICE VISIT 08/02/11-PT EXERCISES DAILY-NO C/O OF CHEST PAIN  . Diabetes mellitus    BORDERLINE - PT CHECKS HIS BLOOD SUGARS AT HOME - NO MEDS  . DVT of leg (deep venous thrombosis) ([place]) 2003   S/P CABG SURGERY  . Hyperlipidemia   . Hypertension   . Stroke ([place])   . Urine retention    PT STATES HE HAS BEEN DOING I&O CATHS FOR PAST 3 YRS    MEDICATIONS:  Prior to Admission medications   Medication Sig Start Date End Date Taking? Authorizing Provider  Ascorbic Acid (VITAMIN C) 1000 MG tablet Take 1,000 mg by mouth daily.    [provider]  cholecalciferol (VITAMIN D) 1000 units tablet Take 1,000 Units by mouth daily.    [provider]  ELIQUIS 5 MG TABS tablet TAKE 1 TABLET BY MOUTH TWICE A DAY 09/02/18   Frann Rider, NP  metoprolol  (TOPROL-XL) 50 MG 24 hr tablet Take 50 mg by mouth daily before breakfast.     [provider]  Multiple Vitamins-Minerals (MULTIVITAMIN ADULT PO) Take by mouth.    [provider]  ramipril (ALTACE) 10 MG capsule Take 10 mg by mouth daily after breakfast.     [provider]  rosuvastatin (CRESTOR) 40 MG tablet Take 1 tablet (40 mg total) by mouth at bedtime. 02/12/17   Rosalin Hawking, MD    ALLERGIES:  No Known Allergies  SOCIAL HISTORY:  Social History   Tobacco Use  . Smoking status: Never Smoker  . Smokeless tobacco: Never Used  Substance Use Topics  . Alcohol use: No    FAMILY HISTORY: Family History  Problem Relation Age of Onset  . Cancer Mother        Breast  . Diabetes Father   . Heart failure Father     EXAM: BP (!) 141/87 (BP Location: Right Arm)   Pulse 67   Resp 18   SpO2 97%  CONSTITUTIONAL: Alert and oriented and responds appropriately to questions. Well-appearing; well-nourished HEAD: Normocephalic EYES: Conjunctivae clear, pupils appear equal, EOMI ENT: normal nose; moist mucous membranes NECK: Supple, no meningismus, no nuchal rigidity, no LAD  CARD: RRR; S1 and S2 appreciated; no murmurs, no clicks, no rubs, no gallops RESP: Normal chest excursion without splinting or tachypnea; breath sounds clear and equal bilaterally; no wheezes, no rhonchi, no rales, no hypoxia  or respiratory distress, speaking full sentences ABD/GI: Normal bowel sounds; non-distended; soft, diffusely tender throughout the lower abdomen and left upper quadrant with intermittent voluntary guarding, no rebound BACK:  The back appears normal and is non-tender to palpation, there is no CVA tenderness EXT: Normal ROM in all joints; non-tender to palpation; no edema; normal capillary refill; no cyanosis, no calf tenderness or swelling    SKIN: Normal color for age and race; warm; no rash NEURO: Moves all extremities equally PSYCH: The patient's mood and manner are  appropriate. Grooming and personal hygiene are appropriate.  MEDICAL DECISION MAKING: Patient here with diffuse abdominal pain.  Differential includes constipation, bowel obstruction, colitis, diverticulitis, appendicitis.  He is diffusely tender on exam with intermittent voluntary guarding.  Will obtain CT of his abdomen and pelvis for further evaluation.  Labs obtained in triage are unremarkable other than mild leukocytosis of 12,000.  Urine shows no sign of infection or significant dehydration.  Will give pain and nausea medicine.  ED PROGRESS: 7:05 AM  CT AP pending.  Signed out to oncoming EDP.   I reviewed all nursing notes, vitals, pertinent previous records, EKGs, lab and urine results, imaging (as available).      Ward, Delice Bison, DO 04/04/19 [phone]

## 2019-04-04 NOTE — ED Notes (Signed)
Patient transported to CT SCAN . 

## 2019-04-04 NOTE — ED Triage Notes (Signed)
Patient reports low abdominal pain/pressure with constipation for several days unrelieved by laxatives/enema . Denies emesis or diarrhea , no fever or chills .

## 2019-04-04 NOTE — ED Provider Notes (Signed)
Care of the patient assumed at signout. Patient's CT results concerning for acute diverticulitis, no acute obstruction. I have conveyed this information to the family, and now, after receiving initial antibiotics, Cipro, Flagyl via IV, he is awake, alert, still having intermittent pain. With a lengthy conversation about all findings again, and options for close outpatient follow-up with his gastroenterologist, who he saw within the last month versus admission. With improved condition here, no evidence for abscess, bacteremia, sepsis, the patient is amenable to, prefers outpatient follow-up. Patient discharged with ongoing analgesia, antibiotics, GI follow-up.   Carmin Muskrat, MD 04/04/19 1021

## 2019-04-04 NOTE — ED Triage Notes (Signed)
Family member in room with PT

## 2019-04-13 DIAGNOSIS — K5730 Diverticulosis of large intestine without perforation or abscess without bleeding: Secondary | ICD-10-CM | POA: Diagnosis not present

## 2019-04-13 DIAGNOSIS — R933 Abnormal findings on diagnostic imaging of other parts of digestive tract: Secondary | ICD-10-CM | POA: Diagnosis not present

## 2019-04-13 DIAGNOSIS — Z86010 Personal history of colonic polyps: Secondary | ICD-10-CM | POA: Diagnosis not present

## 2019-05-10 ENCOUNTER — Other Ambulatory Visit: Payer: Self-pay | Admitting: Adult Health

## 2019-05-10 DIAGNOSIS — I6302 Cerebral infarction due to thrombosis of basilar artery: Secondary | ICD-10-CM

## 2019-05-10 NOTE — Telephone Encounter (Signed)
Eliquis refilled x 3 months with note to pharmacy re: future refills with PCP.

## 2019-05-13 ENCOUNTER — Other Ambulatory Visit: Payer: Self-pay

## 2019-05-13 DIAGNOSIS — I6302 Cerebral infarction due to thrombosis of basilar artery: Secondary | ICD-10-CM

## 2019-05-13 MED ORDER — APIXABAN 5 MG PO TABS: 5.0000 mg | ORAL_TABLET | Freq: Two times a day (BID) | ORAL | 0 refills | Status: AC

## 2019-05-13 NOTE — Telephone Encounter (Signed)
Refills for Eliquis  to be forward to patients primary doctor. Pt was seen by Janett Billow NP for his last two visits. Pt was release back to primary doctor in February 2020.

## 2019-05-18 DIAGNOSIS — H18413 Arcus senilis, bilateral: Secondary | ICD-10-CM | POA: Diagnosis not present

## 2019-05-18 DIAGNOSIS — H2513 Age-related nuclear cataract, bilateral: Secondary | ICD-10-CM | POA: Diagnosis not present

## 2019-05-18 DIAGNOSIS — H25013 Cortical age-related cataract, bilateral: Secondary | ICD-10-CM | POA: Diagnosis not present

## 2019-05-18 DIAGNOSIS — H25043 Posterior subcapsular polar age-related cataract, bilateral: Secondary | ICD-10-CM | POA: Diagnosis not present

## 2019-05-18 DIAGNOSIS — H2512 Age-related nuclear cataract, left eye: Secondary | ICD-10-CM | POA: Diagnosis not present

## 2019-05-20 ENCOUNTER — Encounter: Payer: Self-pay | Admitting: Cardiovascular Disease

## 2019-05-20 ENCOUNTER — Encounter: Payer: Self-pay | Admitting: *Deleted

## 2019-05-23 ENCOUNTER — Encounter: Payer: Self-pay | Admitting: Adult Health

## 2019-05-25 ENCOUNTER — Telehealth: Payer: Self-pay | Admitting: *Deleted

## 2019-05-25 NOTE — Telephone Encounter (Signed)
Fax received /confirmation for [employer] [facility] a note, for pt to resume role as Programmer, multimedia.  [phone].  Sent to MR.

## 2019-05-27 DIAGNOSIS — N182 Chronic kidney disease, stage 2 (mild): Secondary | ICD-10-CM | POA: Diagnosis not present

## 2019-05-27 DIAGNOSIS — I129 Hypertensive chronic kidney disease with stage 1 through stage 4 chronic kidney disease, or unspecified chronic kidney disease: Secondary | ICD-10-CM | POA: Diagnosis not present

## 2019-05-27 DIAGNOSIS — E559 Vitamin D deficiency, unspecified: Secondary | ICD-10-CM | POA: Diagnosis not present

## 2019-05-27 DIAGNOSIS — E1122 Type 2 diabetes mellitus with diabetic chronic kidney disease: Secondary | ICD-10-CM | POA: Diagnosis not present

## 2019-05-27 DIAGNOSIS — E785 Hyperlipidemia, unspecified: Secondary | ICD-10-CM | POA: Diagnosis not present

## 2019-06-01 DIAGNOSIS — I2510 Atherosclerotic heart disease of native coronary artery without angina pectoris: Secondary | ICD-10-CM | POA: Diagnosis not present

## 2019-06-01 DIAGNOSIS — D696 Thrombocytopenia, unspecified: Secondary | ICD-10-CM | POA: Diagnosis not present

## 2019-06-01 DIAGNOSIS — I129 Hypertensive chronic kidney disease with stage 1 through stage 4 chronic kidney disease, or unspecified chronic kidney disease: Secondary | ICD-10-CM | POA: Diagnosis not present

## 2019-06-01 DIAGNOSIS — Z86718 Personal history of other venous thrombosis and embolism: Secondary | ICD-10-CM | POA: Diagnosis not present

## 2019-06-01 DIAGNOSIS — E559 Vitamin D deficiency, unspecified: Secondary | ICD-10-CM | POA: Diagnosis not present

## 2019-06-01 DIAGNOSIS — M8580 Other specified disorders of bone density and structure, unspecified site: Secondary | ICD-10-CM | POA: Diagnosis not present

## 2019-06-01 DIAGNOSIS — I6302 Cerebral infarction due to thrombosis of basilar artery: Secondary | ICD-10-CM | POA: Diagnosis not present

## 2019-06-01 DIAGNOSIS — Z0000 Encounter for general adult medical examination without abnormal findings: Secondary | ICD-10-CM | POA: Diagnosis not present

## 2019-06-01 DIAGNOSIS — Z23 Encounter for immunization: Secondary | ICD-10-CM | POA: Diagnosis not present

## 2019-06-01 DIAGNOSIS — E1122 Type 2 diabetes mellitus with diabetic chronic kidney disease: Secondary | ICD-10-CM | POA: Diagnosis not present

## 2019-06-01 DIAGNOSIS — Q211 Atrial septal defect: Secondary | ICD-10-CM | POA: Diagnosis not present

## 2019-06-01 DIAGNOSIS — E785 Hyperlipidemia, unspecified: Secondary | ICD-10-CM | POA: Diagnosis not present

## 2019-06-07 DIAGNOSIS — D1801 Hemangioma of skin and subcutaneous tissue: Secondary | ICD-10-CM | POA: Diagnosis not present

## 2019-06-07 DIAGNOSIS — L570 Actinic keratosis: Secondary | ICD-10-CM | POA: Diagnosis not present

## 2019-06-07 DIAGNOSIS — Z85828 Personal history of other malignant neoplasm of skin: Secondary | ICD-10-CM | POA: Diagnosis not present

## 2019-06-07 DIAGNOSIS — Z85820 Personal history of malignant melanoma of skin: Secondary | ICD-10-CM | POA: Diagnosis not present

## 2019-06-07 DIAGNOSIS — L821 Other seborrheic keratosis: Secondary | ICD-10-CM | POA: Diagnosis not present

## 2019-07-26 ENCOUNTER — Telehealth: Payer: Self-pay | Admitting: *Deleted

## 2019-07-26 NOTE — Telephone Encounter (Signed)
I called pt about if he needed to stop elqiuis prior to eye surgery. I stated the MD doing the surgery has to notified us if the medication should be stop. I advise pt to contact the eye surgeon and have them fax over a clearance form to [facility] NP.I also stated we dont know when the generic eliquis medication will be in stock. I advise him to call the pharmacy to find out a possible date. Pt was also advise that PCP will be managing his eliquis for him long term. Pt verbalized understanding of the above conversation and has our fax number for the eye MD.

## 2019-07-26 NOTE — Telephone Encounter (Signed)
Patient stopped by the office.  States he has an upcoming eye surgery and needs to know if he needs to stop the Eliquis?  Also wants to know if there is a generic for Eliquis?  Please call.

## 2019-08-09 DIAGNOSIS — H2512 Age-related nuclear cataract, left eye: Secondary | ICD-10-CM | POA: Diagnosis not present

## 2019-08-10 DIAGNOSIS — H2511 Age-related nuclear cataract, right eye: Secondary | ICD-10-CM | POA: Diagnosis not present

## 2019-08-18 DIAGNOSIS — L570 Actinic keratosis: Secondary | ICD-10-CM | POA: Diagnosis not present

## 2019-08-18 DIAGNOSIS — Z85828 Personal history of other malignant neoplasm of skin: Secondary | ICD-10-CM | POA: Diagnosis not present

## 2019-08-18 DIAGNOSIS — L820 Inflamed seborrheic keratosis: Secondary | ICD-10-CM | POA: Diagnosis not present

## 2019-08-23 DIAGNOSIS — H52223 Regular astigmatism, bilateral: Secondary | ICD-10-CM | POA: Diagnosis not present

## 2019-08-23 DIAGNOSIS — H2511 Age-related nuclear cataract, right eye: Secondary | ICD-10-CM | POA: Diagnosis not present

## 2019-08-23 DIAGNOSIS — H524 Presbyopia: Secondary | ICD-10-CM | POA: Diagnosis not present

## 2019-08-23 DIAGNOSIS — Z961 Presence of intraocular lens: Secondary | ICD-10-CM | POA: Diagnosis not present

## 2019-08-24 ENCOUNTER — Other Ambulatory Visit: Payer: Self-pay

## 2019-08-24 ENCOUNTER — Emergency Department (HOSPITAL_COMMUNITY): Payer: Medicare Other

## 2019-08-24 ENCOUNTER — Emergency Department (HOSPITAL_COMMUNITY)
Admission: EM | Admit: 2019-08-24 | Discharge: 2019-08-24 | Disposition: A | Discharge status: Home / Self Care | Payer: Medicare Other | Attending: Emergency Medicine | Admitting: Emergency Medicine

## 2019-08-24 DIAGNOSIS — H539 Unspecified visual disturbance: Secondary | ICD-10-CM | POA: Diagnosis not present

## 2019-08-24 DIAGNOSIS — Z951 Presence of aortocoronary bypass graft: Secondary | ICD-10-CM | POA: Diagnosis not present

## 2019-08-24 DIAGNOSIS — Z7901 Long term (current) use of anticoagulants: Secondary | ICD-10-CM | POA: Insufficient documentation

## 2019-08-24 DIAGNOSIS — I10 Essential (primary) hypertension: Secondary | ICD-10-CM | POA: Insufficient documentation

## 2019-08-24 DIAGNOSIS — Z79899 Other long term (current) drug therapy: Secondary | ICD-10-CM | POA: Diagnosis not present

## 2019-08-24 DIAGNOSIS — E119 Type 2 diabetes mellitus without complications: Secondary | ICD-10-CM | POA: Diagnosis not present

## 2019-08-24 DIAGNOSIS — H532 Diplopia: Secondary | ICD-10-CM | POA: Diagnosis present

## 2019-08-24 DIAGNOSIS — H4922 Sixth [abducent] nerve palsy, left eye: Secondary | ICD-10-CM | POA: Diagnosis not present

## 2019-08-24 DIAGNOSIS — Z8673 Personal history of transient ischemic attack (TIA), and cerebral infarction without residual deficits: Secondary | ICD-10-CM | POA: Insufficient documentation

## 2019-08-24 DIAGNOSIS — H4920 Sixth [abducent] nerve palsy, unspecified eye: Secondary | ICD-10-CM | POA: Diagnosis not present

## 2019-08-24 LAB — URINALYSIS, ROUTINE W REFLEX MICROSCOPIC
Bilirubin Urine: NEGATIVE
Glucose, UA: NEGATIVE mg/dL
Hgb urine dipstick: NEGATIVE
Ketones, ur: NEGATIVE mg/dL
Leukocytes,Ua: NEGATIVE
Nitrite: NEGATIVE
Protein, ur: NEGATIVE mg/dL
Specific Gravity, Urine: 1.01 (ref 1.005–1.030)
pH: 6 (ref 5.0–8.0)

## 2019-08-24 LAB — PROTIME-INR
INR: 1 (ref 0.8–1.2)
Prothrombin Time: 12.9 seconds (ref 11.4–15.2)

## 2019-08-24 LAB — COMPREHENSIVE METABOLIC PANEL
ALT: 32 U/L (ref 0–44)
AST: 28 U/L (ref 15–41)
Albumin: 3.9 g/dL (ref 3.5–5.0)
Alkaline Phosphatase: 59 U/L (ref 38–126)
Anion gap: 10 (ref 5–15)
BUN: 14 mg/dL (ref 8–23)
CO2: 25 mmol/L (ref 22–32)
Calcium: 9.2 mg/dL (ref 8.9–10.3)
Chloride: 104 mmol/L (ref 98–111)
Creatinine, Ser: 0.86 mg/dL (ref 0.61–1.24)
GFR calc Af Amer: >60 mL/min (ref >60)
GFR calc non Af Amer: >60 mL/min (ref >60)
Glucose, Bld: 127 mg/dL — ABNORMAL HIGH (ref 70–99)
Potassium: 4.4 mmol/L (ref 3.5–5.1)
Sodium: 139 mmol/L (ref 135–145)
Total Bilirubin: 0.8 mg/dL (ref 0.3–1.2)
Total Protein: 6.8 g/dL (ref 6.5–8.1)

## 2019-08-24 LAB — I-STAT CHEM 8, ED
BUN: 17 mg/dL (ref 8–23)
Calcium, Ion: 1.13 mmol/L — ABNORMAL LOW (ref 1.15–1.40)
Chloride: 103 mmol/L (ref 98–111)
Creatinine, Ser: 0.8 mg/dL (ref 0.61–1.24)
Glucose, Bld: 121 mg/dL — ABNORMAL HIGH (ref 70–99)
HCT: 45 % (ref 39.0–52.0)
Hemoglobin: 15.3 g/dL (ref 13.0–17.0)
Potassium: 4.2 mmol/L (ref 3.5–5.1)
Sodium: 137 mmol/L (ref 135–145)
TCO2: 25 mmol/L (ref 22–32)

## 2019-08-24 LAB — DIFFERENTIAL
Abs Immature Granulocytes: 0.03 10*3/uL (ref 0.00–0.07)
Basophils Absolute: 0 10*3/uL (ref 0.0–0.1)
Basophils Relative: 1 %
Eosinophils Absolute: 0.1 10*3/uL (ref 0.0–0.5)
Eosinophils Relative: 2 %
Immature Granulocytes: 1 %
Lymphocytes Relative: 34 %
Lymphs Abs: 2.2 10*3/uL (ref 0.7–4.0)
Monocytes Absolute: 0.7 10*3/uL (ref 0.1–1.0)
Monocytes Relative: 10 %
Neutro Abs: 3.3 10*3/uL (ref 1.7–7.7)
Neutrophils Relative %: 52 %

## 2019-08-24 LAB — CBC
HCT: 46.2 % (ref 39.0–52.0)
Hemoglobin: 15.4 g/dL (ref 13.0–17.0)
MCH: 29 pg (ref 26.0–34.0)
MCHC: 33.3 g/dL (ref 30.0–36.0)
MCV: 87 fL (ref 80.0–100.0)
Platelets: 122 10*3/uL — ABNORMAL LOW (ref 150–400)
RBC: 5.31 MIL/uL (ref 4.22–5.81)
RDW: 13.2 % (ref 11.5–15.5)
WBC: 6.4 10*3/uL (ref 4.0–10.5)
nRBC: 0 % (ref 0.0–0.2)

## 2019-08-24 LAB — CBG MONITORING, ED: Glucose-Capillary: 118 mg/dL — ABNORMAL HIGH (ref 70–99)

## 2019-08-24 LAB — APTT: aPTT: 33 seconds (ref 24–36)

## 2019-08-24 MED ORDER — RAMIPRIL 10 MG PO CAPS
10.0000 mg | ORAL_CAPSULE | Freq: Every day | ORAL | Status: DC
  Administered 2019-08-24: 10 mg via ORAL
  Filled 2019-08-24: qty 1

## 2019-08-24 NOTE — ED Notes (Signed)
Pt returned from MRI °

## 2019-08-24 NOTE — ED Notes (Signed)
Patient verbalizes understanding of discharge instructions. Opportunity for questioning and answers were provided. Pt discharged from ED. 

## 2019-08-24 NOTE — ED Triage Notes (Signed)
Pt sent by Dr. Abbott Pao had cataract surgery yesterday. Returned to opthamologist today for recheck. 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

## 2019-08-24 NOTE — ED Provider Notes (Signed)
[facility] EMERGENCY DEPARTMENT Provider Note   CSN: [id number] Arrival date & time: 08/24/19  [id number]     History Chief Complaint  Patient presents with  . Diplopia  . Hypertension    Jacob Hensley is a 74 y.o. male.  The history is provided by the patient and medical records. No language interpreter was used.  Hypertension   Jacob Hensley is a 74 y.o. male who presents to the Emergency Department complaining of elevated blood pressure. He presents the emergency department upon referral from his ophthalmologist, Dr. Talbert Forest for evaluation of elevated blood pressure. He has a history of hypertension, CVA secondary to DVT with PFO currently on eliquis. On January 11 he had left cataract surgery. He was doing well post operatively but on January 21 he noticed when he was driving at night there was ghosting of his vision when trying to see street signs out of his left eye. He does not call it true double vision but there is blurring and overlap on vision out of the left eye alone. He states that the symptoms do come and go at night since then. Today he had a follow-up appointment with his ophthalmologist. He was noted to be hypertensive at the appointment. On examination in the office he had diplopia on left ward gaze with limitation of abduction in the left eye. He has no systemic complaints. He typically takes his blood pressure medications after breakfast around nine or 10 in the morning. He has not missed any doses of his medications. Denies headache, numbness, weakness, fevers, cough.    Past Medical History:  Diagnosis Date  . BPH (benign prostatic hyperplasia)   . Coronary artery disease    CABG 2003-DR. CROITORU IS PT'S CARDIOLOGIST - LAST OFFICE VISIT 08/02/11-PT EXERCISES DAILY-NO C/O OF CHEST PAIN  . Diabetes mellitus    BORDERLINE - PT CHECKS HIS BLOOD SUGARS AT HOME - NO MEDS  . DVT of leg (deep venous thrombosis) ([place]) 2003   S/P CABG SURGERY  .  Hyperlipidemia   . Hypertension   . Stroke ([place])   . Urine retention    PT STATES HE HAS BEEN DOING I&O CATHS FOR PAST 3 YRS    Patient Active Problem List   Diagnosis Date Noted  . Controlled type 2 diabetes mellitus without complication, without long-term current use of insulin ([place]) 10/14/2017  . Dyslipidemia 09/15/2017  . Recurrent acute deep vein thrombosis (DVT) of lower extremity ([place]) 03/03/2017  . Pure hyperglyceridemia 03/03/2017  . Diabetes mellitus ([place]) 03/03/2017  . Acute deep vein thrombosis (DVT) of femoral vein of right lower extremity ([place])   . PFO (patent foramen ovale)   . Cerebrovascular accident (CVA) due to thrombosis of basilar artery ([place]) 01/26/2017  . BPH (benign prostatic hyperplasia) 01/26/2017  . Drug-induced bradycardia 08/01/2014  . Coronary artery disease without angina pectoris 08/06/2013  . Essential hypertension 08/06/2013  . Hyperlipidemia 08/06/2013    Past Surgical History:  Procedure Laterality Date  . CORONARY ARTERY BYPASS GRAFT  02/05/2002   LIMA to LAD,RIMA to PDA,SVG to diagonal & free left radial arter to the oblique marginal artery - Dr. Cyndia Bent  . CPET w/PFT  1//20/2014   Normal  . CYSTOSCOPY WITH BIOPSY  11/08/2011   Procedure: CYSTOSCOPY WITH BIOPSY;  Surgeon: Fredricka Bonine, MD;  Location: [facility];  Service: Urology;;  Bladder biopsies  . CYSTOSCOPY WITH BIOPSY  06/19/2012   Procedure: CYSTOSCOPY WITH BIOPSY;  Surgeon: Fredricka Bonine, MD;  Location: [facility];  Service: Urology;  Laterality: N/A;  fulgaration of bleeders  . myolema of back  2012  . TONSILLECTOMY    . TRANSURETHRAL RESECTION OF PROSTATE  11-08-2011  . US ECHOCARDIOGRAPHY  04/14/2009   EF >50%,borderline dilated RV w/normal systolic fx,mildly dilated LA,mild AI,MR,TR.       Family History  Problem Relation Age of Onset  . Cancer Mother        Breast  . Diabetes Father   . Heart failure Father     Social History   Tobacco Use  . Smoking  status: Never Smoker  . Smokeless tobacco: Never Used  Substance Use Topics  . Alcohol use: No  . Drug use: No    Home Medications Prior to Admission medications   Medication Sig Start Date End Date Taking? Authorizing Provider  apixaban (ELIQUIS) 5 MG TABS tablet Take 1 tablet (5 mg total) by mouth 2 (two) times daily. 05/13/19  Yes McCue, Janett Billow, NP  Ascorbic Acid (VITAMIN C) 1000 MG tablet Take 1,000 mg by mouth daily.   Yes [provider]  cholecalciferol (VITAMIN D) 1000 units tablet Take 1,000 Units by mouth daily.   Yes [provider]  DUREZOL 0.05 % EMUL Place 1 drop into the left eye daily. 08/17/19  Yes [provider]  Lactobacillus (PROBIOTIC ACIDOPHILUS PO) Take 1 capsule by mouth daily.   Yes [provider]  metoprolol (TOPROL-XL) 50 MG 24 hr tablet Take 50 mg by mouth daily before breakfast.    Yes [provider]  Multiple Vitamins-Minerals (MULTIVITAMIN WITH MINERALS) tablet Take 1 tablet by mouth daily.   Yes [provider]  Omega 3-6-9 Fatty Acids (OMEGA 3-6-9 COMPLEX PO) Take 1 capsule by mouth daily.   Yes [provider]  PROLENSA 0.07 % SOLN Place 1 drop into the left eye at bedtime. 07/15/19  Yes [provider]  ramipril (ALTACE) 10 MG capsule Take 10 mg by mouth daily after breakfast.    Yes [provider]  rosuvastatin (CRESTOR) 40 MG tablet Take 1 tablet (40 mg total) by mouth at bedtime. 02/12/17  Yes Rosalin Hawking, MD  ciprofloxacin (CIPRO) 500 MG tablet Take 1 tablet (500 mg total) by mouth 2 (two) times daily. Patient not taking: Reported on 08/24/2019 04/04/19   Carmin Muskrat, MD  HYDROcodone-acetaminophen (NORCO/VICODIN) 5-325 MG tablet Take 1 tablet by mouth every 6 (six) hours as needed for severe pain. Patient not taking: Reported on 08/24/2019 04/04/19   Carmin Muskrat, MD  metroNIDAZOLE (FLAGYL) 500 MG tablet Take 1 tablet (500 mg total) by mouth 2 (two) times  daily. Patient not taking: Reported on 08/24/2019 04/04/19   Carmin Muskrat, MD    Allergies    Patient has no known allergies.  Review of Systems   Review of Systems  All other systems reviewed and are negative.   Physical Exam Updated Vital Signs BP (!) 148/92   Pulse 61   Temp 98.1 F (36.7 C) (Oral)   Resp 13   Ht 5\' 9"  (1.753 m)   Wt 76.2 kg   SpO2 96%   BMI 24.81 kg/m   Physical Exam Vitals and nursing note reviewed.  Constitutional:      Appearance: He is well-developed.  HENT:     Head: Normocephalic and atraumatic.  Cardiovascular:     Rate and Rhythm: Normal rate and regular rhythm.     Heart sounds: No murmur.  Pulmonary:     Effort: Pulmonary effort  is normal. No respiratory distress.     Breath sounds: Normal breath sounds.  Abdominal:     Palpations: Abdomen is soft.     Tenderness: There is no abdominal tenderness. There is no guarding or rebound.  Musculoskeletal:        General: No swelling or tenderness.  Skin:    General: Skin is warm and dry.  Neurological:     Mental Status: He is alert and oriented to person, place, and time.     Comments: Visual fields are grossly intact. EOMI. No asymmetry of facial movements. No pronator drift. Five out of five strength in all four extremities with sensation to light touch intact in all four extremities.  Psychiatric:        Behavior: Behavior normal.     ED Results / Procedures / Treatments   Labs (all labs ordered are listed, but only abnormal results are displayed) Labs Reviewed  CBC - Abnormal; Notable for the following components:      Result Value   Platelets 122 (*)    All other components within normal limits  COMPREHENSIVE METABOLIC PANEL - Abnormal; Notable for the following components:   Glucose, Bld 127 (*)    All other components within normal limits  I-STAT CHEM 8, ED - Abnormal; Notable for the following components:   Glucose, Bld 121 (*)    Calcium, Ion 1.13 (*)    All other  components within normal limits  CBG MONITORING, ED - Abnormal; Notable for the following components:   Glucose-Capillary 118 (*)    All other components within normal limits  PROTIME-INR  APTT  DIFFERENTIAL  URINALYSIS, ROUTINE W REFLEX MICROSCOPIC    EKG EKG Interpretation  Date/Time:  Tuesday August 24 2019 08:34:25 EST Ventricular Rate:  58 PR Interval:    QRS Duration: 96 QT Interval:  441 QTC Calculation: 434 R Axis:   13 Text Interpretation: Sinus rhythm Atrial premature complex Prolonged PR interval Confirmed by Quintella Reichert [phone]) on 08/24/2019 8:52:23 AM   Radiology MR BRAIN WO CONTRAST  Result Date: 08/24/2019 CLINICAL DATA:  Sixth nerve palsy EXAM: MRI HEAD WITHOUT CONTRAST TECHNIQUE: Multiplanar, multiecho pulse sequences of the brain and surrounding structures were obtained without intravenous contrast. COMPARISON:  2018 FINDINGS: Brain: There is no cerebellopontine angle or prepontine mass. Cisternal abducent nerves are unremarkable. Cavernous sinuses appear symmetric on this noncontrast study. No acute infarction or intracranial hemorrhage. There is no mass effect or edema. There is no extra-axial fluid collection. Patchy foci of T2 hyperintensity in the supratentorial white matter are nonspecific but may reflect mild chronic microvascular ischemic changes. Vascular: Major vessel flow voids at the skull base are preserved. Skull and upper cervical spine: Normal marrow signal is preserved. Sinuses/Orbits: Trace mucosal thickening. Bilateral lens replacement. Other: The sella is unremarkable.  Mastoid air cells are clear. IMPRESSION: No acute infarction, hemorrhage, or mass. Mild chronic microvascular ischemic changes. Electronically Signed   By: Macy Mis M.D.   On: 08/24/2019 12:03    Procedures Procedures (including critical care time)  Medications Ordered in ED Medications  ramipril (ALTACE) capsule 10 mg (10 mg Oral Given 08/24/19 1028)    ED Course  I  have reviewed the triage vital signs and the nursing notes.  Pertinent labs & imaging results that were available during my care of the patient were reviewed by me and considered in my medical decision making (see chart for details).    MDM Rules/Calculators/A&P  Patient referred to the emergency department for vision changes and hypertension from ophthalmology office. He was given his home medication, which he missed this morning. On ED evaluation he is neurologically intact with no focal cranial nerve deficits. MRI is negative for acute stroke. Labs are stable when compared to priors. Presentation is not consistent with acute CVA, hypertensive urgency. Discussed with patient home care for hypertension, vision changes. Discussed ophthalmology and PCP follow-up as well as return precautions.  Final Clinical Impression(s) / ED Diagnoses Final diagnoses:  Essential hypertension  Changes in vision    Rx / DC Orders ED Discharge Orders    None       Quintella Reichert, MD 08/24/19 1328

## 2019-08-24 NOTE — ED Notes (Signed)
Pt transported to MRI 

## 2019-08-30 DIAGNOSIS — I129 Hypertensive chronic kidney disease with stage 1 through stage 4 chronic kidney disease, or unspecified chronic kidney disease: Secondary | ICD-10-CM | POA: Diagnosis not present

## 2019-09-06 DIAGNOSIS — E785 Hyperlipidemia, unspecified: Secondary | ICD-10-CM | POA: Diagnosis not present

## 2019-09-06 DIAGNOSIS — E1122 Type 2 diabetes mellitus with diabetic chronic kidney disease: Secondary | ICD-10-CM | POA: Diagnosis not present

## 2019-09-06 DIAGNOSIS — E559 Vitamin D deficiency, unspecified: Secondary | ICD-10-CM | POA: Diagnosis not present

## 2019-09-06 DIAGNOSIS — I129 Hypertensive chronic kidney disease with stage 1 through stage 4 chronic kidney disease, or unspecified chronic kidney disease: Secondary | ICD-10-CM | POA: Diagnosis not present

## 2019-09-13 DIAGNOSIS — E785 Hyperlipidemia, unspecified: Secondary | ICD-10-CM | POA: Diagnosis not present

## 2019-09-13 DIAGNOSIS — I2510 Atherosclerotic heart disease of native coronary artery without angina pectoris: Secondary | ICD-10-CM | POA: Diagnosis not present

## 2019-09-13 DIAGNOSIS — Z86718 Personal history of other venous thrombosis and embolism: Secondary | ICD-10-CM | POA: Diagnosis not present

## 2019-09-13 DIAGNOSIS — E119 Type 2 diabetes mellitus without complications: Secondary | ICD-10-CM | POA: Diagnosis not present

## 2019-09-13 DIAGNOSIS — I129 Hypertensive chronic kidney disease with stage 1 through stage 4 chronic kidney disease, or unspecified chronic kidney disease: Secondary | ICD-10-CM | POA: Diagnosis not present

## 2019-09-13 DIAGNOSIS — Q211 Atrial septal defect: Secondary | ICD-10-CM | POA: Diagnosis not present

## 2019-09-13 DIAGNOSIS — Z7189 Other specified counseling: Secondary | ICD-10-CM | POA: Diagnosis not present

## 2019-09-15 DIAGNOSIS — I129 Hypertensive chronic kidney disease with stage 1 through stage 4 chronic kidney disease, or unspecified chronic kidney disease: Secondary | ICD-10-CM | POA: Diagnosis not present

## 2019-09-20 DIAGNOSIS — E1122 Type 2 diabetes mellitus with diabetic chronic kidney disease: Secondary | ICD-10-CM | POA: Diagnosis not present

## 2019-09-20 DIAGNOSIS — I10 Essential (primary) hypertension: Secondary | ICD-10-CM | POA: Diagnosis not present

## 2019-11-29 DIAGNOSIS — D696 Thrombocytopenia, unspecified: Secondary | ICD-10-CM | POA: Diagnosis not present

## 2019-11-29 DIAGNOSIS — E1122 Type 2 diabetes mellitus with diabetic chronic kidney disease: Secondary | ICD-10-CM | POA: Diagnosis not present

## 2019-12-02 DIAGNOSIS — I10 Essential (primary) hypertension: Secondary | ICD-10-CM | POA: Diagnosis not present

## 2019-12-02 DIAGNOSIS — Z79899 Other long term (current) drug therapy: Secondary | ICD-10-CM | POA: Diagnosis not present

## 2019-12-15 DIAGNOSIS — I10 Essential (primary) hypertension: Secondary | ICD-10-CM | POA: Diagnosis not present

## 2019-12-15 DIAGNOSIS — E1122 Type 2 diabetes mellitus with diabetic chronic kidney disease: Secondary | ICD-10-CM | POA: Diagnosis not present

## 2019-12-15 DIAGNOSIS — I129 Hypertensive chronic kidney disease with stage 1 through stage 4 chronic kidney disease, or unspecified chronic kidney disease: Secondary | ICD-10-CM | POA: Diagnosis not present

## 2019-12-15 DIAGNOSIS — E119 Type 2 diabetes mellitus without complications: Secondary | ICD-10-CM | POA: Diagnosis not present

## 2019-12-15 DIAGNOSIS — Z79899 Other long term (current) drug therapy: Secondary | ICD-10-CM | POA: Diagnosis not present

## 2019-12-15 DIAGNOSIS — Z86718 Personal history of other venous thrombosis and embolism: Secondary | ICD-10-CM | POA: Diagnosis not present

## 2019-12-15 DIAGNOSIS — E785 Hyperlipidemia, unspecified: Secondary | ICD-10-CM | POA: Diagnosis not present

## 2019-12-18 ENCOUNTER — Other Ambulatory Visit: Payer: Self-pay | Admitting: Neurology

## 2019-12-18 DIAGNOSIS — I6302 Cerebral infarction due to thrombosis of basilar artery: Secondary | ICD-10-CM

## 2019-12-20 DIAGNOSIS — Z85828 Personal history of other malignant neoplasm of skin: Secondary | ICD-10-CM | POA: Diagnosis not present

## 2019-12-20 DIAGNOSIS — L821 Other seborrheic keratosis: Secondary | ICD-10-CM | POA: Diagnosis not present

## 2019-12-20 DIAGNOSIS — Z85820 Personal history of malignant melanoma of skin: Secondary | ICD-10-CM | POA: Diagnosis not present

## 2019-12-20 DIAGNOSIS — D1801 Hemangioma of skin and subcutaneous tissue: Secondary | ICD-10-CM | POA: Diagnosis not present

## 2019-12-20 DIAGNOSIS — D225 Melanocytic nevi of trunk: Secondary | ICD-10-CM | POA: Diagnosis not present

## 2019-12-20 DIAGNOSIS — L570 Actinic keratosis: Secondary | ICD-10-CM | POA: Diagnosis not present

## 2019-12-20 DIAGNOSIS — D485 Neoplasm of uncertain behavior of skin: Secondary | ICD-10-CM | POA: Diagnosis not present

## 2019-12-20 DIAGNOSIS — L304 Erythema intertrigo: Secondary | ICD-10-CM | POA: Diagnosis not present

## 2019-12-22 NOTE — Telephone Encounter (Signed)
Refill note sent to Dr. Deland Pretty pts primary doctor.

## 2020-01-05 DIAGNOSIS — E785 Hyperlipidemia, unspecified: Secondary | ICD-10-CM | POA: Diagnosis not present

## 2020-01-05 DIAGNOSIS — Z79899 Other long term (current) drug therapy: Secondary | ICD-10-CM | POA: Diagnosis not present

## 2020-01-05 DIAGNOSIS — E119 Type 2 diabetes mellitus without complications: Secondary | ICD-10-CM | POA: Diagnosis not present

## 2020-01-05 DIAGNOSIS — Z86718 Personal history of other venous thrombosis and embolism: Secondary | ICD-10-CM | POA: Diagnosis not present

## 2020-01-05 DIAGNOSIS — I129 Hypertensive chronic kidney disease with stage 1 through stage 4 chronic kidney disease, or unspecified chronic kidney disease: Secondary | ICD-10-CM | POA: Diagnosis not present

## 2020-01-26 DIAGNOSIS — I2510 Atherosclerotic heart disease of native coronary artery without angina pectoris: Secondary | ICD-10-CM | POA: Diagnosis not present

## 2020-01-26 DIAGNOSIS — E785 Hyperlipidemia, unspecified: Secondary | ICD-10-CM | POA: Diagnosis not present

## 2020-01-26 DIAGNOSIS — R112 Nausea with vomiting, unspecified: Secondary | ICD-10-CM | POA: Diagnosis not present

## 2020-01-26 DIAGNOSIS — I10 Essential (primary) hypertension: Secondary | ICD-10-CM | POA: Diagnosis not present

## 2020-01-26 DIAGNOSIS — E860 Dehydration: Secondary | ICD-10-CM | POA: Diagnosis not present

## 2020-02-16 DIAGNOSIS — Z8551 Personal history of malignant neoplasm of bladder: Secondary | ICD-10-CM | POA: Diagnosis not present

## 2020-02-16 DIAGNOSIS — N5201 Erectile dysfunction due to arterial insufficiency: Secondary | ICD-10-CM | POA: Diagnosis not present

## 2020-02-16 DIAGNOSIS — N400 Enlarged prostate without lower urinary tract symptoms: Secondary | ICD-10-CM | POA: Diagnosis not present

## 2020-02-18 ENCOUNTER — Encounter: Payer: Self-pay | Admitting: Cardiovascular Disease

## 2020-02-18 ENCOUNTER — Ambulatory Visit (INDEPENDENT_AMBULATORY_CARE_PROVIDER_SITE_OTHER): Payer: Medicare Other | Admitting: Cardiovascular Disease

## 2020-02-18 ENCOUNTER — Other Ambulatory Visit: Payer: Self-pay

## 2020-02-18 VITALS — BP 130/75 | HR 51 | Ht 69.0 in | Wt 174.4 lb

## 2020-02-18 DIAGNOSIS — E7800 Pure hypercholesterolemia, unspecified: Secondary | ICD-10-CM

## 2020-02-18 DIAGNOSIS — Z8673 Personal history of transient ischemic attack (TIA), and cerebral infarction without residual deficits: Secondary | ICD-10-CM | POA: Diagnosis not present

## 2020-02-18 DIAGNOSIS — I10 Essential (primary) hypertension: Secondary | ICD-10-CM | POA: Diagnosis not present

## 2020-02-18 DIAGNOSIS — E119 Type 2 diabetes mellitus without complications: Secondary | ICD-10-CM | POA: Diagnosis not present

## 2020-02-18 DIAGNOSIS — Z7901 Long term (current) use of anticoagulants: Secondary | ICD-10-CM

## 2020-02-18 DIAGNOSIS — I2510 Atherosclerotic heart disease of native coronary artery without angina pectoris: Secondary | ICD-10-CM | POA: Diagnosis not present

## 2020-02-18 DIAGNOSIS — Z86718 Personal history of other venous thrombosis and embolism: Secondary | ICD-10-CM

## 2020-02-18 MED ORDER — METOPROLOL SUCCINATE ER 25 MG PO TB24: 25.0000 mg | ORAL_TABLET | Freq: Every day | ORAL | 3 refills | Status: AC

## 2020-02-18 NOTE — Progress Notes (Signed)
Cardiology Office Note    Date:  02/20/2020   ID:  Jacob Hensley, DOB 1945/11/20, MRN [id number]  PCP:  Deland Pretty, MD  Cardiologist:  Sanda Klein, MD  Electrophysiologist:  None   Evaluation Performed:  Follow-Up Visit  Chief Complaint:  CAD s/p CABG  History of Present Illness:    Jacob Hensley is a 74 y.o. male with CAD s/p CABG 2003, ), diet-controlled diabetes mellitus, hypercholesterolemia, essential hypertension, returning for follow-up.  Jacob Hensley feels great.  He has no cardiovascular complaints.  In the past he will occasionally have atypical chest discomfort but even this has not been a complaint in the recent past.  He remains very physically active, walking on a daily basis, sometimes walking 6 miles or more.  Keeps a detailed log of his exercise steps, blood pressure and heart rate.  As expected, he has noticed that his blood pressure is always the best after he exercises.  His average blood pressure is in the 120s/70-low 80s.  His heart rate is frequently slow in the high 40s-low 50s, but he denies any problems with dizziness, lightheadedness, syncope or fatigue.  He denies palpitations, focal neurological events, intermittent claudication or lower extremity edema.    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  Last June he again was able to exercise for over 13 minutes on the [place] protocol.  He is known to have a "false positive" ST segment response, which has been the case ever since his bypass surgery.  He did not have angina during exercise.  His most recent lipid profile showed an LDL cholesterol of 75 A1c was 6.7%.  He does not take medications for diabetes, but carefully watches his diet.  Past Medical History:  Diagnosis Date  . BPH (benign prostatic hyperplasia)   . Coronary artery disease    CABG 2003-DR. Gaye Scorza IS PT'S CARDIOLOGIST - LAST OFFICE VISIT 08/02/11-PT EXERCISES DAILY-NO C/O OF CHEST PAIN  .  Diabetes mellitus    BORDERLINE - PT CHECKS HIS BLOOD SUGARS AT HOME - NO MEDS  . DVT of leg (deep venous thrombosis) ([place]) 2003   S/P CABG SURGERY  . Hyperlipidemia   . Hypertension   . Stroke ([place])   . Urine retention    PT STATES HE HAS BEEN DOING I&O CATHS FOR PAST 3 YRS   Past Surgical History:  Procedure Laterality Date  . CORONARY ARTERY BYPASS GRAFT  02/05/2002   LIMA to LAD,RIMA to PDA,SVG to diagonal & free left radial arter to the oblique marginal artery - Dr. Cyndia Bent  . CPET w/PFT  1//20/2014   Normal  . CYSTOSCOPY WITH BIOPSY  11/08/2011   Procedure: CYSTOSCOPY WITH BIOPSY;  Surgeon: Fredricka Bonine, MD;  Location: [facility];  Service: Urology;;  Bladder biopsies  . CYSTOSCOPY WITH BIOPSY  06/19/2012   Procedure: CYSTOSCOPY WITH BIOPSY;  Surgeon: Fredricka Bonine, MD;  Location: [facility];  Service: Urology;  Laterality: N/A;  fulgaration of bleeders  . myolema of back  2012  . TONSILLECTOMY    . TRANSURETHRAL RESECTION OF PROSTATE  11-08-2011  . US ECHOCARDIOGRAPHY  04/14/2009   EF >50%,borderline dilated RV w/normal systolic fx,mildly dilated LA,mild AI,MR,TR.     Current Meds  Medication Sig  . apixaban (ELIQUIS) 5 MG TABS tablet Take 1 tablet (5 mg total) by mouth 2 (two) times daily.  . Ascorbic Acid (VITAMIN C) 1000 MG tablet Take 1,000 mg by mouth  daily.  . metoprolol succinate (TOPROL-XL) 25 MG 24 hr tablet Take 1 tablet (25 mg total) by mouth daily before breakfast.  . Multiple Vitamins-Minerals (MULTIVITAMIN WITH MINERALS) tablet Take 1 tablet by mouth daily.  . ramipril (ALTACE) 10 MG capsule Take 10 mg by mouth daily after breakfast.   . rosuvastatin (CRESTOR) 40 MG tablet Take 1 tablet (40 mg total) by mouth at bedtime.  . [DISCONTINUED] cholecalciferol (VITAMIN D) 1000 units tablet Take 1,000 Units by mouth daily.  . [DISCONTINUED] metoprolol (TOPROL-XL) 50 MG 24 hr tablet Take 50 mg by mouth daily before breakfast.      Allergies:   Patient has  no known allergies.   Social History   Tobacco Use  . Smoking status: Never Smoker  . Smokeless tobacco: Never Used  Vaping Use  . Vaping Use: Never used  Substance Use Topics  . Alcohol use: No  . Drug use: No     Family Hx: The patient's family history includes Cancer in his mother; Diabetes in his father; Heart failure in his father.  ROS:   Please see the history of present illness.    All other systems are reviewed and are negative.   Prior CV studies:   The following studies were reviewed today:  Treadmill stress test 01/05/2019  Labs/Other Tests and Data Reviewed:    EKG: Is ordered today shows sinus bradycardia with first-degree AV block, nonspecific ST-T-segment changes, QT 436 ms, similar to previous tracings  Recent Labs: 08/24/2019: ALT 32; BUN 17; Creatinine, Ser 0.80; Hemoglobin 15.3; Platelets 122; Potassium 4.2; Sodium 137   Recent Lipid Panel Lab Results  Component Value Date/Time   CHOL 149 01/15/2019 12:48 PM   TRIG 102 01/15/2019 12:48 PM   HDL 54 01/15/2019 12:48 PM   CHOLHDL 2.8 01/15/2019 12:48 PM   CHOLHDL 3.6 01/27/2017 04:10 AM   LDLCALC 75 01/15/2019 12:48 PM    Wt Readings from Last 3 Encounters:  02/18/20 174 lb 6.4 oz (79.1 kg)  08/24/19 168 lb (76.2 kg)  01/27/19 175 lb (79.4 kg)     Objective:    Vital Signs:  BP (!) 130/75   Pulse 51   Ht 5\' 9"  (1.753 m)   Wt 174 lb 6.4 oz (79.1 kg)   SpO2 98%   BMI 25.75 kg/m     General: Alert, oriented x3, no distress, looks younger than stated age Head: no evidence of trauma, PERRL, EOMI, no exophtalmos or lid lag, no myxedema, no xanthelasma; normal ears, nose and oropharynx Neck: normal jugular venous pulsations and no hepatojugular reflux; brisk carotid pulses without delay and no carotid bruits Chest: clear to auscultation, no signs of consolidation by percussion or palpation, normal fremitus, symmetrical and full respiratory excursions Cardiovascular: normal position and quality  of the apical impulse, regular rhythm, normal first and second heart sounds, no murmurs, rubs or gallops Abdomen: no tenderness or distention, no masses by palpation, no abnormal pulsatility or arterial bruits, normal bowel sounds, no hepatosplenomegaly Extremities: no clubbing, cyanosis or edema; 2+ radial, ulnar and brachial pulses bilaterally; 2+ right femoral, posterior tibial and dorsalis pedis pulses; 2+ left femoral, posterior tibial and dorsalis pedis pulses; no subclavian or femoral bruits Neurological: grossly nonfocal Psych: Normal mood and affect   ASSESSMENT & PLAN:    1. CAD s/p CABG: Very active.  Asymptomatic.  He will always have a "false positive" stress ECG.  However, the treadmill test is useful to compare his functional status over time.  He is  still doing very well, much better than the average gentleman his age.   He is on beta-blocker and statin in a high dose.  His LDL cholesterol is not perfect, but his very close to target.  He is not taking aspirin since he is on Eliquis.  2. HTN: Consistently well controlled although the diastolic blood pressure is occasionally a little high.  No changes in medications. 3. HLP: His LDL cholesterol is very close to target and he is on the maximum usual dose of rosuvastatin.  I do not think we need to add more medications.  He is very physically active and is minimally overweight.  4. DM: Well-controlled without medicines. 5. Recurrent DVT: Currently asymptomatic.  On lifelong Eliquis.  6. Hx of stroke: He has a patent foramen ovale and had a small pontine infarct in July 2018, felt to be paradoxical embolism since he also had a subacute right lower extremity DVT at that time.   7. Anticoagulation: Denies falls, injuries or bleeding complications.   Medication Adjustments/Labs and Tests Ordered: Current medicines are reviewed at length with the patient today.  Concerns regarding medicines are outlined above.  Patient Instructions    Medication Instructions:  Metoprolol dose cut down to 25 mg once daily  *If you need a refill on your cardiac medications before your next appointment, please call your pharmacy*   Lab Work: None Ordered If you have labs (blood work) drawn today and your tests are completely normal, you will receive your results only by: Marland Kitchen MyChart Message (if you have MyChart) OR . A paper copy in the mail If you have any lab test that is abnormal or we need to change your treatment, we will call you to review the results.   Testing/Procedures: Your physician has requested that you have an exercise tolerance test. For further information please visit [email or web address]. Please also follow instruction sheet, as given. This will take place at [address], [address].  Do not drink or eat foods with caffeine for 24 hours before the test. (Chocolate, coffee, tea, or energy drinks)  If you use an inhaler, bring it with you to the test.  Do not smoke for 4 hours before the test.  Wear comfortable shoes and clothing. Hold Metoprolol the morning of the test.   Follow-Up: At [facility], you and your health needs are our priority.  As part of our continuing mission to provide you with exceptional heart care, we have created designated Provider Care Teams.  These Care Teams include your primary Cardiologist (physician) and Advanced Practice Providers (APPs -  Physician Assistants and Nurse Practitioners) who all work together to provide you with the care you need, when you need it.  We recommend signing up for the patient portal called "MyChart".  Sign up information is provided on this After Visit Summary.  MyChart is used to connect with patients for Virtual Visits (Telemedicine).  Patients are able to view lab/test results, encounter notes, upcoming appointments, etc.  Non-urgent messages can be sent to your provider as well.   To learn more about what you can do with MyChart, go to  [email or web address].    Your next appointment:   12 month(s)  The format for your next appointment:   In Person  Provider:   You may see Sanda Klein, MD or one of the following Advanced Practice Providers on your designated Care Team:    Almyra Deforest, PA-C  Fabian Sharp, PA-C or   Roby Lofts, [place]  Other Instructions Send 2 Weeks of Blood Pressure readings via MyChart.     Signed, Sanda Klein, MD  02/20/2020 2:16 PM    [place]

## 2020-02-18 NOTE — Patient Instructions (Signed)
Medication Instructions:  Metoprolol dose cut down to 25 mg once daily  *If you need a refill on your cardiac medications before your next appointment, please call your pharmacy*   Lab Work: None Ordered If you have labs (blood work) drawn today and your tests are completely normal, you will receive your results only by: Marland Kitchen MyChart Message (if you have MyChart) OR . A paper copy in the mail If you have any lab test that is abnormal or we need to change your treatment, we will call you to review the results.   Testing/Procedures: Your physician has requested that you have an exercise tolerance test. For further information please visit [email or web address]. Please also follow instruction sheet, as given. This will take place at [place], [address].  Do not drink or eat foods with caffeine for 24 hours before the test. (Chocolate, coffee, tea, or energy drinks)  If you use an inhaler, bring it with you to the test.  Do not smoke for 4 hours before the test.  Wear comfortable shoes and clothing. Hold Metoprolol the morning of the test.   Follow-Up: At [facility], you and your health needs are our priority.  As part of our continuing mission to provide you with exceptional heart care, we have created designated Provider Care Teams.  These Care Teams include your primary Cardiologist (physician) and Advanced Practice Providers (APPs -  Physician Assistants and Nurse Practitioners) who all work together to provide you with the care you need, when you need it.  We recommend signing up for the patient portal called "MyChart".  Sign up information is provided on this After Visit Summary.  MyChart is used to connect with patients for Virtual Visits (Telemedicine).  Patients are able to view lab/test results, encounter notes, upcoming appointments, etc.  Non-urgent messages can be sent to your provider as well.   To learn more about what you can do with MyChart, go to  [email or web address].    Your next appointment:   12 month(s)  The format for your next appointment:   In Person  Provider:   You may see Sanda Klein, MD or one of the following Advanced Practice Providers on your designated Care Team:    Almyra Deforest, PA-C  Fabian Sharp, [place] or   Roby Lofts, [place]    Other Instructions Send 2 Weeks of Blood Pressure readings via MyChart.

## 2020-02-20 ENCOUNTER — Encounter: Payer: Self-pay | Admitting: Cardiovascular Disease

## 2020-02-24 DIAGNOSIS — H524 Presbyopia: Secondary | ICD-10-CM | POA: Diagnosis not present

## 2020-02-24 DIAGNOSIS — H52223 Regular astigmatism, bilateral: Secondary | ICD-10-CM | POA: Diagnosis not present

## 2020-02-24 DIAGNOSIS — H02831 Dermatochalasis of right upper eyelid: Secondary | ICD-10-CM | POA: Diagnosis not present

## 2020-02-24 DIAGNOSIS — Z961 Presence of intraocular lens: Secondary | ICD-10-CM | POA: Diagnosis not present

## 2020-02-24 DIAGNOSIS — H5989 Other postprocedural complications and disorders of eye and adnexa, not elsewhere classified: Secondary | ICD-10-CM | POA: Diagnosis not present

## 2020-02-24 DIAGNOSIS — H26491 Other secondary cataract, right eye: Secondary | ICD-10-CM | POA: Diagnosis not present

## 2020-02-24 DIAGNOSIS — H18413 Arcus senilis, bilateral: Secondary | ICD-10-CM | POA: Diagnosis not present

## 2020-03-03 ENCOUNTER — Telehealth (HOSPITAL_COMMUNITY): Payer: Self-pay

## 2020-03-03 NOTE — Telephone Encounter (Signed)
Encounter compplete.

## 2020-03-07 ENCOUNTER — Telehealth (HOSPITAL_COMMUNITY): Payer: Self-pay | Admitting: *Deleted

## 2020-03-07 NOTE — Telephone Encounter (Signed)
Close encounter 

## 2020-03-09 ENCOUNTER — Other Ambulatory Visit: Payer: Self-pay

## 2020-03-09 ENCOUNTER — Ambulatory Visit (HOSPITAL_COMMUNITY)
Admission: RE | Admit: 2020-03-09 | Discharge: 2020-03-09 | Disposition: A | Discharge status: Home / Self Care | Payer: Medicare Other | Source: Ambulatory Visit | Attending: Cardiology | Admitting: Cardiology

## 2020-03-09 DIAGNOSIS — I2510 Atherosclerotic heart disease of native coronary artery without angina pectoris: Secondary | ICD-10-CM | POA: Diagnosis not present

## 2020-03-09 LAB — EXERCISE TOLERANCE TEST
Estimated workload: 15.1 METS
Exercise duration (min): 13 min
Exercise duration (sec): 0 s
MPHR: 147 {beats}/min
Peak HR: 134 {beats}/min
Percent HR: 90 %
Rest HR: 61 {beats}/min

## 2020-03-15 DIAGNOSIS — I2510 Atherosclerotic heart disease of native coronary artery without angina pectoris: Secondary | ICD-10-CM | POA: Diagnosis not present

## 2020-03-15 DIAGNOSIS — I129 Hypertensive chronic kidney disease with stage 1 through stage 4 chronic kidney disease, or unspecified chronic kidney disease: Secondary | ICD-10-CM | POA: Diagnosis not present

## 2020-03-15 DIAGNOSIS — Z86718 Personal history of other venous thrombosis and embolism: Secondary | ICD-10-CM | POA: Diagnosis not present

## 2020-03-15 DIAGNOSIS — E1122 Type 2 diabetes mellitus with diabetic chronic kidney disease: Secondary | ICD-10-CM | POA: Diagnosis not present

## 2020-03-15 DIAGNOSIS — E785 Hyperlipidemia, unspecified: Secondary | ICD-10-CM | POA: Diagnosis not present

## 2020-04-05 DIAGNOSIS — Z1152 Encounter for screening for COVID-19: Secondary | ICD-10-CM | POA: Diagnosis not present

## 2020-04-06 DIAGNOSIS — K4090 Unilateral inguinal hernia, without obstruction or gangrene, not specified as recurrent: Secondary | ICD-10-CM | POA: Diagnosis not present

## 2020-04-06 DIAGNOSIS — I82409 Acute embolism and thrombosis of unspecified deep veins of unspecified lower extremity: Secondary | ICD-10-CM | POA: Diagnosis not present

## 2020-04-11 ENCOUNTER — Telehealth: Payer: Self-pay | Admitting: *Deleted

## 2020-04-11 NOTE — Telephone Encounter (Signed)
Patient with diagnosis of recurrent VTE on Eliquis for anticoagulation, also with history of CVA.    Procedure: right hernia repair Date of procedure: TBD  CrCl 94mL/min Platelet count 122K  Per office protocol, patient can hold Eliquis for 1 day prior to procedure due to elevated VTE risk off of anticoagulation. If > 24 hour hold is required, will need input from PCP who is prescribing Eliquis.

## 2020-04-11 NOTE — Telephone Encounter (Signed)
Patient is on Eliquis for recurrent DVT.  Clinical pharmacist to review.

## 2020-04-11 NOTE — Telephone Encounter (Signed)
   [facility] Pre-operative Risk Assessment    PRIMARY CARDIOLOGIST ;DR XNATFTDD   Request for surgical clearance:  1. What type of surgery is being performed?  RIGHT HERNIA REPAIR   2. When is this surgery scheduled? TBD  3. What type of clearance is required (medical clearance vs. Pharmacy clearance to hold med vs. Both)? BOTH  4. Are there any medications that need to be held prior to surgery and how long?ELIQUIS 5 MG   5. Practice name and name of physician performing surgery?  [facility] ; DR Gurney Maxin  6. What is the office phone number? [phone]   7.   What is the office fax number? Porter JONES , CMA  8.   Anesthesia type (None, local, MAC, general) ? GENERAL    Raiford Simmonds 04/11/2020, 12:52 PM  _________________________________________________________________   (provider comments below)

## 2020-04-11 NOTE — Telephone Encounter (Signed)
   Primary Cardiologist: Sanda Klein, MD  Chart reviewed as part of pre-operative protocol coverage. Patient was contacted 04/11/2020 in reference to pre-operative risk assessment for pending surgery as outlined below.  Jacob Hensley was last seen on 02/18/2020 by Dr. Sallyanne Kuster.  Since that day, Jacob Hensley has done well without chest pain or shortness of breath. He has great exercise level. Recent plain old treadmill test was unchanged. (note, his treadmill test always come back abnormal sine the CABG, lack of change indicate no new critical lesion).   Therefore, based on ACC/AHA guidelines, the patient would be at acceptable risk for the planned procedure without further cardiovascular testing.   The patient was advised that if he develops new symptoms prior to surgery to contact our office to arrange for a follow-up visit, and he verbalized understanding.  I will route this recommendation to the requesting party via Epic fax function and remove from pre-op pool. Please call with questions.  [place], [place] 04/11/2020, 6:44 PM

## 2020-04-20 ENCOUNTER — Telehealth: Payer: Self-pay | Admitting: Adult Health

## 2020-04-20 NOTE — Telephone Encounter (Signed)
Jacob Hensley, Pt came in tried to call and phones were down. He has a question. He was released from [place] last year. 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 and he had a sensation like a crown on his head and tingling. His wife wanted to have him check with a nurse to see if he should be concerned. He has no other issues and thought it may be how he was sitting while on the phone like a nerve. Can you give him a call just to ease his mind. He leaves by 5pm so I advised you will try your best to get a return call to him by end of day. Thank you

## 2020-04-20 NOTE — Telephone Encounter (Signed)
Dr. Rexene Alberts- Janett Billow is out today. Can you please review?

## 2020-04-20 NOTE — Telephone Encounter (Signed)
Please call and advise patient to consider getting checked out for his symptoms, and it is hard to predict what caused his tingling.  I would recommend he get checked out by his primary care physician or if need be he can go to the nearest emergency room.  I will also copy Dr. Leonie Man for his input as he is our stroke specialist, in case he has different input.

## 2020-04-20 NOTE — Telephone Encounter (Signed)
Called pt back. Relayed Dr. Guadelupe Sabin recommendations. He verbalized understanding. Wife spoke with their PCP office about sx as well. He will follow back up with PCP. Aware to call us if they want him to follow up with our office and we can schedule appt. 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 He will see if PCP can fill out since it has been over a year since last seen at our office.

## 2020-05-08 ENCOUNTER — Encounter: Payer: Self-pay | Admitting: Cardiovascular Disease

## 2020-06-07 DIAGNOSIS — Z86718 Personal history of other venous thrombosis and embolism: Secondary | ICD-10-CM | POA: Diagnosis not present

## 2020-06-07 DIAGNOSIS — E1122 Type 2 diabetes mellitus with diabetic chronic kidney disease: Secondary | ICD-10-CM | POA: Diagnosis not present

## 2020-06-07 DIAGNOSIS — I129 Hypertensive chronic kidney disease with stage 1 through stage 4 chronic kidney disease, or unspecified chronic kidney disease: Secondary | ICD-10-CM | POA: Diagnosis not present

## 2020-06-07 DIAGNOSIS — E785 Hyperlipidemia, unspecified: Secondary | ICD-10-CM | POA: Diagnosis not present

## 2020-06-12 DIAGNOSIS — D692 Other nonthrombocytopenic purpura: Secondary | ICD-10-CM | POA: Diagnosis not present

## 2020-06-12 DIAGNOSIS — M8580 Other specified disorders of bone density and structure, unspecified site: Secondary | ICD-10-CM | POA: Diagnosis not present

## 2020-06-12 DIAGNOSIS — E559 Vitamin D deficiency, unspecified: Secondary | ICD-10-CM | POA: Diagnosis not present

## 2020-06-12 DIAGNOSIS — E785 Hyperlipidemia, unspecified: Secondary | ICD-10-CM | POA: Diagnosis not present

## 2020-06-12 DIAGNOSIS — E1122 Type 2 diabetes mellitus with diabetic chronic kidney disease: Secondary | ICD-10-CM | POA: Diagnosis not present

## 2020-06-12 DIAGNOSIS — D696 Thrombocytopenia, unspecified: Secondary | ICD-10-CM | POA: Diagnosis not present

## 2020-06-12 DIAGNOSIS — M25512 Pain in left shoulder: Secondary | ICD-10-CM | POA: Diagnosis not present

## 2020-06-12 DIAGNOSIS — I10 Essential (primary) hypertension: Secondary | ICD-10-CM | POA: Diagnosis not present

## 2020-06-12 DIAGNOSIS — I2510 Atherosclerotic heart disease of native coronary artery without angina pectoris: Secondary | ICD-10-CM | POA: Diagnosis not present

## 2020-06-12 DIAGNOSIS — Z0000 Encounter for general adult medical examination without abnormal findings: Secondary | ICD-10-CM | POA: Diagnosis not present

## 2020-06-12 DIAGNOSIS — I129 Hypertensive chronic kidney disease with stage 1 through stage 4 chronic kidney disease, or unspecified chronic kidney disease: Secondary | ICD-10-CM | POA: Diagnosis not present

## 2020-06-19 DIAGNOSIS — S8391XA Sprain of unspecified site of right knee, initial encounter: Secondary | ICD-10-CM | POA: Diagnosis not present

## 2020-06-26 DIAGNOSIS — S8391XD Sprain of unspecified site of right knee, subsequent encounter: Secondary | ICD-10-CM | POA: Diagnosis not present

## 2020-08-08 NOTE — Progress Notes (Signed)
Subjective:    CC: L shoulder pain  I, Molly Weber, LAT, ATC, am serving as scribe for Dr. Lynne Leader.  HPI: Pt is a 75 y/o male presenting w/ c/o L shoulder pain x  8-9 months w/ no known MOI. He does recall falling about 4 years ago and landing on his L elbow/shoulder.  He locates his pain to his L lateral upper arm .  Radiating pain: yes sometimes into his L upper arm L shoulder mechanical symptoms:  Intermittently yes Aggravating factors: laying on his L side/arm; horizontal aDd Treatments tried: Nothing  He also would like to discuss his R knee x few weeks/month.  He slipped and hit his knee on the decking of an 18 wheeler.  He reports having experienced a pop in his knee and then had difficulty walking.  He was seen at an Urgent Care in [facility] and was written out of work x 8 days.  He has been wearing a knee brace.  Since he's stopped working during the holiday season, he notes improvement.  Pertinent review of Systems: No fevers or chills  Relevant historical information: Recurrent DVT.  Diabetes.  History of CABG   Objective:    Vitals:   08/09/20 0956  BP: (!) 160/98  Pulse: 62  SpO2: 95%   General: Well Developed, well nourished, and in no acute distress.   MSK: Left shoulder normal-appearing Nontender. Normal shoulder motion. Intact strength. Positive Hawkins and Neer's test.  Negative empty can test.  Right knee: Mild effusion otherwise normal. Normal motion with crepitation. Tender palpation mildly medial joint line. Otherwise nontender. Stable ligamentous exam Negative McMurray's test.   Lab and Radiology Results   X-ray images right knee and left shoulder obtained today personally and independently interpreted  Right knee: Medial compartment DJD.  No fractures visible.  Left shoulder: Mild AC DJD.  Mild glenohumeral DJD.  No acute fractures.  Await formal radiology review  Diagnostic Limited MSK Ultrasound of: Left shoulder Biceps  tendon intact. Subscapularis tendon intact normal. Supraspinatus tendon area of mild hypoechoic change within distal tendon consistent with old tear.  No significant retraction or large gap visible.  This is consistent with prior now healed rotator cuff tear. Moderate subacromial bursitis present. Infraspinatus tendon thin but otherwise normal-appearing AC joint degenerative with effusion Impression: Prior supraspinatus tear now healed appearing subacromial bursitis  Diagnostic Limited MSK Ultrasound of: Right knee Quad tendon intact normal. Moderate joint effusion present. Lateral joint line narrowed and degenerative. Medial joint line narrowed and degenerative with partially extruded medial meniscus. Posterior knee no Baker's cyst. Impression: Joint effusion and degeneration with degenerative meniscus tear.   Impression and Recommendations:    Assessment and Plan: 75 y.o. male with left shoulder pain occurring for 8 or 9 months.  Pain occurred after a fall.  Initially he notes a period of time where he had trouble abducting his arm.  I think it is likely that he suffered a supraspinatus rotator cuff tear that has now healed reasonably well.  He is still having some pain and dysfunction consistent with subacromial bursitis.  He should do well with physical therapy.  Plan for further PT and recheck back in about 6 weeks.  Right knee pain and swelling: This occurred after a contusion type injury.  He does have degenerative changes and a joint effusion.  Plan for trial of Voltaren gel compression sleeve and physical therapy.  If not improving consider steroid injection.  Recheck in about 6 weeks.Marland Kitchen  PDMP not reviewed this encounter. Orders Placed This Encounter  Procedures  . [place] LIMITED JOINT SPACE STRUCTURES UP LEFT(NO LINKED CHARGES)    Order Specific Question:   Reason for Exam (SYMPTOM  OR DIAGNOSIS REQUIRED)    Answer:   L shoulder pain    Order Specific Question:   Preferred  imaging location?    Answer:   [place]  . DG Shoulder Left    Standing Status:   Future    Number of Occurrences:   1    Standing Expiration Date:   08/09/2021    Order Specific Question:   Reason for Exam (SYMPTOM  OR DIAGNOSIS REQUIRED)    Answer:   eval shoulder pain    Order Specific Question:   Preferred imaging location?    Answer:   [facility]  . DG Knee AP/LAT W/[place] Right    Standing Status:   Future    Number of Occurrences:   1    Standing Expiration Date:   08/09/2021    Order Specific Question:   Reason for Exam (SYMPTOM  OR DIAGNOSIS REQUIRED)    Answer:   eval knee pain    Order Specific Question:   Preferred imaging location?    Answer:   [facility]  . Ambulatory referral to Physical Therapy    Referral Priority:   Routine    Referral Type:   Physical Medicine    Referral Reason:   Specialty Services Required    Requested Specialty:   Physical Therapy   No orders of the defined types were placed in this encounter.   Discussed warning signs or symptoms. Please see discharge instructions. Patient expresses understanding.   The above documentation has been reviewed and is accurate and complete Lynne Leader, M.D.

## 2020-08-09 ENCOUNTER — Ambulatory Visit: Payer: Self-pay

## 2020-08-09 ENCOUNTER — Encounter: Payer: Self-pay | Admitting: Family Medicine

## 2020-08-09 ENCOUNTER — Ambulatory Visit (INDEPENDENT_AMBULATORY_CARE_PROVIDER_SITE_OTHER): Payer: Medicare Other

## 2020-08-09 ENCOUNTER — Encounter: Payer: Self-pay | Admitting: Physical Therapy

## 2020-08-09 ENCOUNTER — Other Ambulatory Visit: Payer: Self-pay

## 2020-08-09 ENCOUNTER — Ambulatory Visit (INDEPENDENT_AMBULATORY_CARE_PROVIDER_SITE_OTHER): Payer: Medicare Other | Admitting: Family Medicine

## 2020-08-09 ENCOUNTER — Ambulatory Visit (INDEPENDENT_AMBULATORY_CARE_PROVIDER_SITE_OTHER): Payer: Medicare Other | Admitting: Physical Therapy

## 2020-08-09 VITALS — BP 160/98 | HR 62 | Ht 69.0 in | Wt 179.4 lb

## 2020-08-09 DIAGNOSIS — M25561 Pain in right knee: Secondary | ICD-10-CM

## 2020-08-09 DIAGNOSIS — G8929 Other chronic pain: Secondary | ICD-10-CM

## 2020-08-09 DIAGNOSIS — M6281 Muscle weakness (generalized): Secondary | ICD-10-CM

## 2020-08-09 DIAGNOSIS — M25512 Pain in left shoulder: Secondary | ICD-10-CM

## 2020-08-09 DIAGNOSIS — M25461 Effusion, right knee: Secondary | ICD-10-CM | POA: Diagnosis not present

## 2020-08-09 DIAGNOSIS — M19012 Primary osteoarthritis, left shoulder: Secondary | ICD-10-CM | POA: Diagnosis not present

## 2020-08-09 NOTE — Progress Notes (Signed)
X-ray knee shows no fracture. Small amount of fluid visible in the knee on x-ray as well as the ultrasound.

## 2020-08-09 NOTE — Progress Notes (Signed)
Prior injury of the left shoulder visible. This probably reflects your injury back last year.

## 2020-08-09 NOTE — Patient Instructions (Addendum)
Thank you for coming in today.  Please use voltaren gel up to 4x daily for pain as needed.   I've referred you to Physical Therapy.  Let us know if you don't hear from them in one week.  Please get an Xray today before you leave  Recheck with me in about 6 weeks.   If worsening I can do more such as injection or proceed to MRI.    Shoulder Impingement Syndrome  Shoulder impingement syndrome is a condition that causes pain when connective tissues (tendons) surrounding the shoulder joint become pinched. These tendons are part of the group of muscles and tissues that help to stabilize the shoulder (rotator cuff). Beneath the rotator cuff is a fluid-filled sac (bursa) that allows the muscles and tendons to glide smoothly. The bursa may become swollen or irritated (bursitis). Bursitis, swelling in the rotator cuff tendons, or both conditions can decrease how much space is under a bone in the shoulder joint (acromion), resulting in impingement. What are the causes? Shoulder impingement syndrome may be caused by bursitis or swelling of the rotator cuff tendons, which may result from:  Repetitive overhead arm movements.  Falling onto the shoulder.  Weakness in the shoulder muscles. What increases the risk? You may be more likely to develop this condition if you:  Play sports that involve throwing, such as baseball.  Participate in sports such as tennis, volleyball, and swimming.  Work as a Curator, Games developer, or Architect. Some people are also more likely to develop impingement syndrome because of the shape of their acromion bone. What are the signs or symptoms? The main symptom of this condition is pain on the front or side of the shoulder. The pain may:  Get worse when lifting or raising the arm.  Get worse at night.  Wake you up from sleeping.  Feel sharp when the shoulder is moved and then fade to an ache. Other symptoms may  include:  Tenderness.  Stiffness.  Inability to raise the arm above shoulder level or behind the body.  Weakness. How is this diagnosed? This condition may be diagnosed based on:  Your symptoms and medical history.  A physical exam.  Imaging tests, such as: ? X-rays. ? MRI. ? Ultrasound. How is this treated? This condition may be treated by:  Resting your shoulder and avoiding all activities that cause pain or put stress on the shoulder.  Icing your shoulder.  NSAIDs to help reduce pain and swelling.  One or more injections of medicines to numb the area and reduce inflammation.  Physical therapy.  Surgery. This may be needed if nonsurgical treatments have not helped. Surgery may involve repairing the rotator cuff, reshaping the acromion, or removing the bursa. Follow these instructions at home: Managing pain, stiffness, and swelling  If directed, put ice on the injured area. ? Put ice in a plastic bag. ? Place a towel between your skin and the bag. ? Leave the ice on for 20 minutes, 2-3 times a day.   Activity  Rest and return to your normal activities as told by your health care provider. Ask your health care provider what activities are safe for you.  Do exercises as told by your health care provider. General instructions  Do not use any products that contain nicotine or tobacco, such as cigarettes, e-cigarettes, and chewing tobacco. These can delay healing. If you need help quitting, ask your health care provider.  Ask your health care provider when it is safe for you  to drive.  Take over-the-counter and prescription medicines only as told by your health care provider.  Keep all follow-up visits as told by your health care provider. This is important. How is this prevented?  Give your body time to rest between periods of activity.  Be safe and responsible while being active. This will help you avoid falls.  Maintain physical fitness, including strength  and flexibility. Contact a health care provider if:  Your symptoms have not improved after 1-2 months of treatment and rest.  You cannot lift your arm away from your body. Summary  Shoulder impingement syndrome is a condition that causes pain when connective tissues (tendons) surrounding the shoulder joint become pinched.  The main symptom of this condition is pain on the front or side of the shoulder.  This condition is usually treated with rest, ice, and pain medicines as needed. This information is not intended to replace advice given to you by your health care provider. Make sure you discuss any questions you have with your health care provider. Document Revised: 11/06/2018 Document Reviewed: 01/07/2018 Elsevier Patient Education  2021 [employer].

## 2020-08-09 NOTE — Therapy (Signed)
[place] [address] [place], [place], [address] Phone: [phone]   Fax:  [phone]  Physical Therapy Evaluation  Patient Details  Name: Jacob Hensley DOBIS MRN: [id number] Date of Birth: 01-Apr-1946 Referring Provider (PT): Dr Lynne Leader   Encounter Date: 08/09/2020   PT End of Session - 08/09/20 1156    Visit Number 1    Number of Visits 12    Date for PT Re-Evaluation 09/20/20    Authorization Type MCR    Progress Note Due on Visit 10    PT Start Time [id number]    PT Stop Time 1254    PT Time Calculation (min) 58 min    Activity Tolerance Patient tolerated treatment well    Behavior During Therapy [facility] for tasks assessed/performed           Past Medical History:  Diagnosis Date  . BPH (benign prostatic hyperplasia)   . Coronary artery disease    CABG 2003-DR. CROITORU IS PT'S CARDIOLOGIST - LAST OFFICE VISIT 08/02/11-PT EXERCISES DAILY-NO C/O OF CHEST PAIN  . Diabetes mellitus    BORDERLINE - PT CHECKS HIS BLOOD SUGARS AT HOME - NO MEDS  . DVT of leg (deep venous thrombosis) ([place]) 2003   S/P CABG SURGERY  . Hyperlipidemia   . Hypertension   . Stroke ([place])   . Urine retention    PT STATES HE HAS BEEN DOING I&O CATHS FOR PAST 3 YRS    Past Surgical History:  Procedure Laterality Date  . CORONARY ARTERY BYPASS GRAFT  02/05/2002   LIMA to LAD,RIMA to PDA,SVG to diagonal & free left radial arter to the oblique marginal artery - Dr. Cyndia Bent  . CPET w/PFT  1//20/2014   Normal  . CYSTOSCOPY WITH BIOPSY  11/08/2011   Procedure: CYSTOSCOPY WITH BIOPSY;  Surgeon: Fredricka Bonine, MD;  Location: [facility];  Service: Urology;;  Bladder biopsies  . CYSTOSCOPY WITH BIOPSY  06/19/2012   Procedure: CYSTOSCOPY WITH BIOPSY;  Surgeon: Fredricka Bonine, MD;  Location: [facility];  Service: Urology;  Laterality: N/A;  fulgaration of bleeders  . myolema of back  2012  . TONSILLECTOMY    . TRANSURETHRAL RESECTION OF  PROSTATE  11-08-2011  . US ECHOCARDIOGRAPHY  04/14/2009   EF >50%,borderline dilated RV w/normal systolic fx,mildly dilated LA,mild AI,MR,TR.    There were no vitals filed for this visit.    Subjective Assessment - 08/09/20 1156    Subjective Pt reports he developed Lt shoulder pain about 8-9 months ago.  He drives for holiday tours and about 4 yrs ago he fell on that shoulder and is not sure if that started it. For Rt knee he hit it on a truck when doing extra work with [employer] over the holidays.    Diagnostic tests [place] Lt shoulder old tear - and bursitis, Rt knee meniscal tear    Patient Stated Goals feel like he did 8 months ago    Currently in Pain? Yes    Pain Location Shoulder    Pain Orientation Left    Pain Type Chronic pain    Pain Onset More than a month ago    Pain Frequency Intermittent    Aggravating Factors  leaning on the arm and lying on the Lt side to sleep    Pain Relieving Factors moving it out of the position    Multiple Pain Sites Yes    Pain Score 3    Pain Location Knee  Pain Orientation Right    Pain Descriptors / Indicators Aching;Shooting    Pain Type Acute pain    Pain Onset More than a month ago    Pain Frequency Intermittent    Aggravating Factors  one episode of the Rt knee giving out when walking down a ramp,    Pain Relieving Factors knee brace with ice pack and rest              [facility] PT Assessment - 08/09/20 0001      Assessment   Medical Diagnosis Chronic Lt shoulder pain and Rt shoulder pain    Referring Provider (PT) Dr Lynne Leader    Onset Date/Surgical Date 11/08/19    Hand Dominance Right    Next MD Visit 09/18/2020    Prior Therapy yes      Precautions   Precautions None      Balance Screen   Has the patient fallen in the past 6 months No    Has the patient had a decrease in activity level because of a fear of falling?  No    Is the patient reluctant to leave their home because of a fear of falling?  No      Home Academic librarian Private residence    Home Layout Two level   no trouble     Prior Function   Level of Independence Independent    Vocation Part time employment    Vocation Requirements driving      Observation/Other Assessments   Focus on Therapeutic Outcomes (FOTO)  76%      Functional Tests   Functional tests Squat;Single Leg Squat      Squat   Comments WNL      Single Leg Squat   Comments WNL      Posture/Postural Control   Posture/Postural Control Postural limitations    Postural Limitations Rounded Shoulders;Forward head;Increased thoracic kyphosis      ROM / Strength   AROM / PROM / Strength AROM;Strength      AROM   Overall AROM Comments cervical and bliat UE's WFL    AROM Assessment Site Shoulder;Cervical;Knee    Right/Left Knee --   WNL     Strength   Overall Strength Comments mid back 4/5    Strength Assessment Site Shoulder;Hip;Knee    Right/Left Shoulder --   Rt 5/5, Lt 5/5 except ER 4/5   Right/Left Hip --   55   Right/Left Knee --   Rt 4+/5, Lt 5/5     Flexibility   Soft Tissue Assessment /Muscle Length yes    Hamstrings slight tightness bila    Quadriceps Lt 6" from buttocks with prone ben      Palpation   Spinal mobility thoracic WNL with CPA mobs    Palpation comment tender along medial Rt knee joint and in Lt deltoid      Special Tests    Special Tests Rotator Cuff Impingement    Rotator Cuff Impingment tests Michel Bickers test      Hawkins-Kennedy test   Findings Positive    Side Left                      Objective measurements completed on examination: See above findings.       [facility] Adult PT Treatment/Exercise - 08/09/20 0001      Exercises   Exercises Shoulder;Knee/Hip      Knee/Hip Exercises: Stretches   Sports administrator Right;1  rep;60 seconds   prone with strap     Knee/Hip Exercises: Supine   Straight Leg Raise with External Rotation Strengthening;Right;10 reps      Shoulder Exercises: Supine    Horizontal ABduction Strengthening;Both;10 reps;Theraband   lying over rolled up towel   Theraband Level (Shoulder Horizontal ABduction) Level 2 (Red)    External Rotation Strengthening;Both;10 reps;Theraband   lying over rolled up towel   Theraband Level (Shoulder External Rotation) Level 2 (Red)    Flexion Strengthening;Both;10 reps;Theraband   lying over rolled up towel overhead pull   Theraband Level (Shoulder Flexion) Level 2 (Red)      Shoulder Exercises: Stretch   Other Shoulder Stretches thoracic mobilization lying over rolled up noodle                  PT Education - 08/09/20 1302    Education Details HEP and POC    Person(s) Educated Patient    Methods Explanation;Demonstration;Handout    Comprehension Returned demonstration;Verbalized understanding               PT Long Term Goals - 08/09/20 1310      PT LONG TERM GOAL #1   Title I with advanced HEP    Time 6    Period Weeks    Status New    Target Date 09/20/20      PT LONG TERM GOAL #2   Title report decreased shoulder pain to allow him to sleep per his previous level    Time 6    Period Weeks    Status New    Target Date 09/20/20      PT LONG TERM GOAL #3   Title report =/> 75% reductioon in Rt knee pain with daily activity    Time 6    Period Weeks    Status New    Target Date 09/20/20      PT LONG TERM GOAL #4   Title improve FOTO 52%    Time 6    Period Weeks    Status New    Target Date 09/20/20      PT LONG TERM GOAL #5   Title improve strength of Lt shoulder ER and upper back 5-/5    Time 6    Period Weeks    Status New    Target Date 09/20/20                  Plan - 08/09/20 1302    Clinical Impression Statement 75 yo very active man presents with 9 month c/o Lt shoulder pain and 2 month c/o Rt knee pain.  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  Spends a lot of time driving and in a forward posture.  This past holiday season he  hit his knee and has had some medial knee pain since.  Diagnostic US show old Lt RTC tear with healing and bursitis, Rt knee - medial meniscus small tear.  He has some tightness in the Rt quad, pecs and lats.  Some functional weakness in the quads, weak upper back and Lt shoulder ER.  He has significant increased thoracic kyphosis and FWD head with rounded shoulders.  He would benefit from PT to address these defecits and restore his PLOF    Personal Factors and Comorbidities Comorbidity 3+    Comorbidities see snap shot    Examination-Activity Limitations Reach Overhead;Stairs;Carry;Sleep    Examination-Participation Restrictions Other  Stability/Clinical Decision Making Stable/Uncomplicated    Clinical Decision Making Low    Rehab Potential Excellent    PT Frequency 2x / week    PT Duration 6 weeks    PT Treatment/Interventions Iontophoresis 4mg /ml Dexamethasone;Taping;Vasopneumatic Device;Patient/family education;Functional mobility training;Moist Heat;Passive range of motion;Therapeutic activities;Cryotherapy;Electrical Stimulation;Neuromuscular re-education;Manual techniques;Therapeutic exercise    PT Next Visit Plan progress postural ex to decrease kyphosis, strengthen ER shoulders, possible ionto to shoulder and medial knee,  Functional quad strength and flexibility    PT Home Exercise Plan [email or web address]    Consulted and Agree with Plan of Care Patient           Patient will benefit from skilled therapeutic intervention in order to improve the following deficits and impairments:  Impaired UE functional use,Pain,Decreased strength,Postural dysfunction  Visit Diagnosis: Chronic left shoulder pain - Plan: PT plan of care cert/re-cert  Acute pain of right knee - Plan: PT plan of care cert/re-cert  Muscle weakness (generalized) - Plan: PT plan of care cert/re-cert     Problem List Patient Active Problem List   Diagnosis Date Noted  . Controlled type 2 diabetes mellitus without  complication, without long-term current use of insulin ([place]) 10/14/2017  . Dyslipidemia 09/15/2017  . Recurrent acute deep vein thrombosis (DVT) of lower extremity ([place]) 03/03/2017  . Pure hyperglyceridemia 03/03/2017  . Diabetes mellitus ([place]) 03/03/2017  . Acute deep vein thrombosis (DVT) of femoral vein of right lower extremity ([place])   . PFO (patent foramen ovale)   . Cerebrovascular accident (CVA) due to thrombosis of basilar artery ([place]) 01/26/2017  . BPH (benign prostatic hyperplasia) 01/26/2017  . Drug-induced bradycardia 08/01/2014  . Coronary artery disease without angina pectoris 08/06/2013  . Essential hypertension 08/06/2013  . Hyperlipidemia 08/06/2013    Jeral Pinch PT  08/09/2020, 1:14 PM  [facility] [place], [place], [address] Phone: [phone]   Fax:  [phone]  Name: BJ TERAN MRN: [id number] Date of Birth: [birth year]

## 2020-08-09 NOTE — Patient Instructions (Signed)
Access Code: [email or web address] URL: [email or web address] Date: 08/09/2020 Prepared by: Jeral Pinch  Exercises Seated Scapular Retraction - 5-10 reps Cervical Retraction at Wall - 5-10 reps Straight Leg Raise with External Rotation - 1 x daily - 3 sets - 10 reps Prone Quadriceps Stretch with Strap - 1 x daily - 3 reps - 60sec hold Supine shoulder flexion with band - 1 x daily - 3 sets - 10 reps Supine Shoulder Horizontal Abduction with Resistance - 1 x daily - 3 sets - 10 reps Supine Shoulder External Rotation with Resistance - 1 x daily - 3 sets - 10 reps Thoracic Extension Mobilization with yoga mat - 1 x daily - 3 sets - 10 reps

## 2020-08-10 ENCOUNTER — Ambulatory Visit: Payer: Medicare Other | Admitting: Physical Therapy

## 2020-08-16 ENCOUNTER — Other Ambulatory Visit: Payer: Self-pay

## 2020-08-16 ENCOUNTER — Ambulatory Visit (INDEPENDENT_AMBULATORY_CARE_PROVIDER_SITE_OTHER): Payer: Medicare Other | Admitting: Physical Therapy

## 2020-08-16 DIAGNOSIS — G8929 Other chronic pain: Secondary | ICD-10-CM

## 2020-08-16 DIAGNOSIS — M25561 Pain in right knee: Secondary | ICD-10-CM | POA: Diagnosis not present

## 2020-08-16 DIAGNOSIS — M6281 Muscle weakness (generalized): Secondary | ICD-10-CM

## 2020-08-16 DIAGNOSIS — M25512 Pain in left shoulder: Secondary | ICD-10-CM

## 2020-08-16 NOTE — Therapy (Signed)
[facility] [address] [place], [place], [address] Phone: [phone]   Fax:  [phone]  Physical Therapy Treatment  Patient Details  Name: Jacob Hensley MRN: [id number] Date of Birth: March 11, 1946 Referring Provider (PT): Dr Clementeen GrahamEvan Corey   Encounter Date: 08/16/2020   PT End of Session - 08/16/20 1019    Visit Number 2    Number of Visits 12    Date for PT Re-Evaluation 09/20/20    Authorization Type MCR    Progress Note Due on Visit 10    PT Start Time 1014    PT Stop Time 1106    PT Time Calculation (min) 52 min    Activity Tolerance Patient tolerated treatment well    Behavior During Therapy [facility] for tasks assessed/performed           Past Medical History:  Diagnosis Date  . BPH (benign prostatic hyperplasia)   . Coronary artery disease    CABG 2003-DR. CROITORU IS PT'S CARDIOLOGIST - LAST OFFICE VISIT 08/02/11-PT EXERCISES DAILY-NO C/O OF CHEST PAIN  . Diabetes mellitus    BORDERLINE - PT CHECKS HIS BLOOD SUGARS AT HOME - NO MEDS  . DVT of leg (deep venous thrombosis) ([facility]) 2003   S/P CABG SURGERY  . Hyperlipidemia   . Hypertension   . Stroke ([facility])   . Urine retention    PT STATES HE HAS BEEN DOING I&O CATHS FOR PAST 3 YRS    Past Surgical History:  Procedure Laterality Date  . CORONARY ARTERY BYPASS GRAFT  02/05/2002   LIMA to LAD,RIMA to PDA,SVG to diagonal & free left radial arter to the oblique marginal artery - Dr. Laneta SimmersBartle  . CPET w/PFT  1//20/2014   Normal  . CYSTOSCOPY WITH BIOPSY  11/08/2011   Procedure: CYSTOSCOPY WITH BIOPSY;  Surgeon: Antony HasteMatthew Ramsey Eskridge, MD;  [facility];  Service: Urology;;  Bladder biopsies  . CYSTOSCOPY WITH BIOPSY  06/19/2012   Procedure: CYSTOSCOPY WITH BIOPSY;  Surgeon: Antony HasteMatthew Ramsey Eskridge, MD;  Location: [facility];  Service: Urology;  Laterality: N/A;  fulgaration of bleeders  . myolema of back  2012  . TONSILLECTOMY    . TRANSURETHRAL RESECTION OF  PROSTATE  11-08-2011  . US ECHOCARDIOGRAPHY  04/14/2009   EF >50%,borderline dilated RV w/normal systolic fx,mildly dilated LA,mild AI,MR,TR.    There were no vitals filed for this visit.   Subjective Assessment - 08/16/20 1019    Subjective Pt reports he was sore in his Lt shoulder when he had performed his exercises, and he still can't sleep on shoulder at night.    Currently in Pain? No/denies   2/10 in Lt shoulder with movement.             [facility] PT Assessment - 08/16/20 0001      Assessment   Medical Diagnosis Chronic Lt shoulder pain and Rt shoulder pain    Referring Provider (PT) Dr Clementeen GrahamEvan Corey    Onset Date/Surgical Date 11/08/19    Hand Dominance Right    Next MD Visit 09/18/2020    Prior Therapy yes      Flexibility   Quadriceps Rt knee 135 deg, 3.5" to buttocks.            [facility] Adult PT Treatment/Exercise - 08/16/20 0001      Self-Care   Self-Care Other Self-Care Comments    Other Self-Care Comments  pt instructed in self massage to Lt pec and periscapular musculature to decrease tightness.  Pt returned demo with cues.      Knee/Hip Exercises: Stretches   Sports administrator Right;Left;2 reps;30 seconds   prone with strap     Knee/Hip Exercises: Aerobic   Nustep L5:  arms/legs x 6 min for warm up.      Knee/Hip Exercises: Supine   Bridges 1 set;10 reps    Straight Leg Raise with External Rotation Strengthening;Right;10 reps      Shoulder Exercises: Supine   Horizontal ABduction Strengthening;Both;10 reps;Theraband   lying over rolled up towel   Theraband Level (Shoulder Horizontal ABduction) Level 2 (Red);Level 3 (Green)    External Rotation Strengthening;Both;10 reps    Theraband Level (Shoulder External Rotation) Level 2 (Red);Level 1 (Yellow)    External Rotation Limitations changed to Yellow band due to pain in bicep tendon    Flexion Both;10 reps   overhead pull, "touchdown".   Theraband Level (Shoulder Flexion) Level 3 (Green)    Other Supine Exercises  snow angels x 5 reps, leading with thumb and no noodle - improved tolerance.      Shoulder Exercises: Standing   Other Standing Exercises scap squeeze and axial ext x 5 sec x 5 reps; cues to hold contraction longer and for form.      Shoulder Exercises: Stretch   Other Shoulder Stretches thoracic mobilization over pool noodle in hooklying, arms abdct to 80 deg, then hands behind head x 20 sec x 2 reps    Other Shoulder Stretches 3 position doorway stretch for pecs x 15 sec x 2 reps each; cues for form.                       PT Long Term Goals - 08/09/20 1310      PT LONG TERM GOAL #1   Title I with advanced HEP    Time 6    Period Weeks    Status New    Target Date 09/20/20      PT LONG TERM GOAL #2   Title report decreased shoulder pain to allow him to sleep per his previous level    Time 6    Period Weeks    Status New    Target Date 09/20/20      PT LONG TERM GOAL #3   Title report =/> 75% reductioon in Rt knee pain with daily activity    Time 6    Period Weeks    Status New    Target Date 09/20/20      PT LONG TERM GOAL #4   Title improve FOTO 52%    Time 6    Period Weeks    Status New    Target Date 09/20/20      PT LONG TERM GOAL #5   Title improve strength of Lt shoulder ER and upper back 5-/5    Time 6    Period Weeks    Status New    Target Date 09/20/20                 Plan - 08/16/20 1522    Clinical Impression Statement Pt able to tolerate increased resistance with some of the supine shoulder exercises (horiz abdct, flex), but required less resistance for bilat ER due to pain at prox Lt bicep.  Modified HEP slightly to improve compliance and comfort.  Pt continues with tightness in Lt pec/anterior shoulder; will benefit from manual therapy/ DN to this area in future session. He required minor cues for form  and posture with exercises. Pt's Rt quad flexiblity has improved since eval.  Goals are ongoing.    Personal Factors and  Comorbidities Comorbidity 3+    Comorbidities see snap shot    Examination-Activity Limitations Reach Overhead;Stairs;Carry;Sleep    Examination-Participation Restrictions Other    Stability/Clinical Decision Making Stable/Uncomplicated    Rehab Potential Excellent    PT Frequency 2x / week    PT Duration 6 weeks    PT Treatment/Interventions Iontophoresis 4mg /ml Dexamethasone;Taping;Vasopneumatic Device;Patient/family education;Functional mobility training;Moist Heat;Passive range of motion;Therapeutic activities;Cryotherapy;Electrical Stimulation;Neuromuscular re-education;Manual techniques;Therapeutic exercise    PT Next Visit Plan progress postural ex to decrease kyphosis, strengthen ER shoulders,  Functional quad strength and flexibility    PT Home Exercise Plan [email or web address]    Consulted and Agree with Plan of Care Patient           Patient will benefit from skilled therapeutic intervention in order to improve the following deficits and impairments:  Impaired UE functional use,Pain,Decreased strength,Postural dysfunction  Visit Diagnosis: Chronic left shoulder pain  Acute pain of right knee  Muscle weakness (generalized)     Problem List Patient Active Problem List   Diagnosis Date Noted  . Controlled type 2 diabetes mellitus without complication, without long-term current use of insulin ([place]) 10/14/2017  . Dyslipidemia 09/15/2017  . Recurrent acute deep vein thrombosis (DVT) of lower extremity ([place]) 03/03/2017  . Pure hyperglyceridemia 03/03/2017  . Diabetes mellitus ([place]) 03/03/2017  . Acute deep vein thrombosis (DVT) of femoral vein of right lower extremity ([place])   . PFO (patent foramen ovale)   . Cerebrovascular accident (CVA) due to thrombosis of basilar artery ([place]) 01/26/2017  . BPH (benign prostatic hyperplasia) 01/26/2017  . Drug-induced bradycardia 08/01/2014  . Coronary artery disease without angina pectoris 08/06/2013  . Essential hypertension 08/06/2013  .  Hyperlipidemia 08/06/2013   Kerin Perna, PTA 08/16/20 3:31 PM  [place], [place], [address] Phone: [phone]   Fax:  [phone]  Name: Jacob Hensley MRN: [id number] Date of Birth: Jan 03, 1946

## 2020-08-16 NOTE — Patient Instructions (Addendum)
Access Code: [email or web address] URL: [email or web address] Date: 08/16/2020 Prepared by: N[place]ita  Program Notes at gym: no overhead press or chest press.    Exercises Seated Scapular Retraction - 2 x daily - 5-10 reps - 5 seconds hold Cervical Retraction at Wall - 5-10 reps - 5 seconds hold Straight Leg Raise with External Rotation - 1 x daily - 2 sets - 10 reps Prone Quadriceps Stretch with Strap - 1 x daily - 3 reps - 60sec hold Supine shoulder flexion with band - 1 x daily - 1 sets - 10 reps Supine Shoulder Horizontal Abduction with Resistance - 1 x daily - 2 sets - 10 reps Supine Shoulder External Rotation with Resistance - 1 x daily - 2 sets - 10 reps Supine Chest Stretch with Elbows Bent - 1 x daily - 7 x weekly - 3 reps - 20 seconds hold Snow Angels - 1 x daily - 7 x weekly - 1 sets - 10 reps Doorway Pec Stretch at 90 Degrees Abduction - 1 x daily - 7 x weekly - 1 sets - 2 reps - 15-20 seconds hold  Trigger Point Dry Needling  . What is Trigger Point Dry Needling (DN)? o DN is a physical therapy technique used to treat muscle pain and dysfunction. Specifically, DN helps deactivate muscle trigger points (muscle knots).  o A thin filiform needle is used to penetrate the skin and stimulate the underlying trigger point. The goal is for a local twitch response (LTR) to occur and for the trigger point to relax. No medication of any kind is injected during the procedure.   . What Does Trigger Point Dry Needling Feel Like?  o The procedure feels different for each individual patient. Some patients report that they do not actually feel the needle enter the skin and overall the process is not painful. Very mild bleeding may occur. However, many patients feel a deep cramping in the muscle in which the needle was inserted. This is the local twitch response.   Marland Kitchen How Will I feel after the treatment? o Soreness is normal, and the onset of soreness may not occur  for a few hours. Typically this soreness does not last longer than two days.  o Bruising is uncommon, however; ice can be used to decrease any possible bruising.  o In rare cases feeling tired or nauseous after the treatment is normal. In addition, your symptoms may get worse before they get better, this period will typically not last longer than 24 hours.   . What Can I do After My Treatment? o Increase your hydration by drinking more water for the next 24 hours. o You may place ice or heat on the areas treated that have become sore, however, do not use heat on inflamed or bruised areas. Heat often brings more relief post needling. o You can continue your regular activities, but vigorous activity is not recommended initially after the treatment for 24 hours. o DN is best combined with other physical therapy such as strengthening, stretching, and other therapies.

## 2020-08-18 ENCOUNTER — Encounter: Payer: Medicare Other | Admitting: Rehabilitative and Restorative Service Providers"

## 2020-08-21 ENCOUNTER — Other Ambulatory Visit: Payer: Self-pay

## 2020-08-21 ENCOUNTER — Ambulatory Visit (INDEPENDENT_AMBULATORY_CARE_PROVIDER_SITE_OTHER): Payer: Medicare Other | Admitting: Rehabilitative and Restorative Service Providers"

## 2020-08-21 DIAGNOSIS — M25561 Pain in right knee: Secondary | ICD-10-CM

## 2020-08-21 DIAGNOSIS — M6281 Muscle weakness (generalized): Secondary | ICD-10-CM

## 2020-08-21 DIAGNOSIS — M25512 Pain in left shoulder: Secondary | ICD-10-CM | POA: Diagnosis not present

## 2020-08-21 DIAGNOSIS — G8929 Other chronic pain: Secondary | ICD-10-CM

## 2020-08-21 NOTE — Patient Instructions (Signed)
Access Code: [email or web address] URL: [email or web address] Date: 08/21/2020 Prepared by: Rudell Cobb  Program Notes at gym: no overhead press or chest press.    Exercises Seated Scapular Retraction - 2 x daily - 5-10 reps - 5 seconds hold Cervical Retraction at Wall - 5-10 reps - 5 seconds hold Doorway Pec Stretch at 90 Degrees Abduction - 1 x daily - 7 x weekly - 1 sets - 2 reps - 15-20 seconds hold Supine shoulder flexion with band - 1 x daily - 1 sets - 10 reps Supine Shoulder Horizontal Abduction with Resistance - 1 x daily - 2 sets - 10 reps Supine Shoulder External Rotation with Resistance - 1 x daily - 2 sets - 10 reps Supine Chest Stretch with Elbows Bent - 1 x daily - 7 x weekly - 3 reps - 20 seconds hold Snow Angels - 1 x daily - 7 x weekly - 1 sets - 10 reps Prone Quadriceps Stretch with Strap - 1 x daily - 3 reps - 60sec hold Straight Leg Raise with External Rotation - 1 x daily - 2 sets - 10 reps Wall Quarter Squat - 2 x daily - 7 x weekly - 1 sets - 10 reps - 3-5 seconds hold Standing Single Leg Heel Raise - 2 x daily - 7 x weekly - 1 sets - 10-20 reps

## 2020-08-21 NOTE — Therapy (Signed)
[place], [place], [address] Phone: [phone]   Fax:  [phone]  Physical Therapy Treatment  Patient Details  Name: Jacob Hensley MRN: [id number] Date of Birth: 09/25/1945 Referring Provider (PT): Dr Lynne Leader   Encounter Date: 08/21/2020   PT End of Session - 08/21/20 1346    Visit Number 3    Number of Visits 12    Date for PT Re-Evaluation 09/20/20    Authorization Type MCR    Progress Note Due on Visit 10    PT Start Time 1346    PT Stop Time 1430    PT Time Calculation (min) 44 min    Activity Tolerance Patient tolerated treatment well    Behavior During Therapy [facility] for tasks assessed/performed           Past Medical History:  Diagnosis Date  . BPH (benign prostatic hyperplasia)   . Coronary artery disease    CABG 2003-DR. CROITORU IS PT'S CARDIOLOGIST - LAST OFFICE VISIT 08/02/11-PT EXERCISES DAILY-NO C/O OF CHEST PAIN  . Diabetes mellitus    BORDERLINE - PT CHECKS HIS BLOOD SUGARS AT HOME - NO MEDS  . DVT of leg (deep venous thrombosis) ([place]) 2003   S/P CABG SURGERY  . Hyperlipidemia   . Hypertension   . Stroke ([place])   . Urine retention    PT STATES HE HAS BEEN DOING I&O CATHS FOR PAST 3 YRS    Past Surgical History:  Procedure Laterality Date  . CORONARY ARTERY BYPASS GRAFT  02/05/2002   LIMA to LAD,RIMA to PDA,SVG to diagonal & free left radial arter to the oblique marginal artery - Dr. Cyndia Bent  . CPET w/PFT  1//20/2014   Normal  . CYSTOSCOPY WITH BIOPSY  11/08/2011   Procedure: CYSTOSCOPY WITH BIOPSY;  Surgeon: Fredricka Bonine, MD;  Location: [facility];  Service: Urology;;  Bladder biopsies  . CYSTOSCOPY WITH BIOPSY  06/19/2012   Procedure: CYSTOSCOPY WITH BIOPSY;  Surgeon: Fredricka Bonine, MD;  Location: [facility];  Service: Urology;  Laterality: N/A;  fulgaration of bleeders  . myolema of back  2012  . TONSILLECTOMY    . TRANSURETHRAL RESECTION OF  PROSTATE  11-08-2011  . US ECHOCARDIOGRAPHY  04/14/2009   EF >50%,borderline dilated RV w/normal systolic fx,mildly dilated LA,mild AI,MR,TR.    There were no vitals filed for this visit.   Subjective Assessment - 08/21/20 1350    Subjective The patient reports no pain at rest in his knee or shoulder today.  Symptoms are mildly improved and he can get more comfortable in bed with his shoulder.  He notes some knee pain when walking in the medial R knee.    Diagnostic tests [place] Lt shoulder old tear - and bursitis, Rt knee meniscal tear    Patient Stated Goals feel like he did 8 months ago    Currently in Pain? No/denies   1/10 shoulder with movement and knee with walking             [facility] PT Assessment - 08/21/20 1351      Assessment   Medical Diagnosis Chronic Lt shoulder pain and Rt knee pain    Referring Provider (PT) Dr Lynne Leader    Onset Date/Surgical Date 11/08/19    Hand Dominance Right                         [facility] Adult PT Treatment/Exercise - 08/21/20  1351      Exercises   Exercises Shoulder;Knee/Hip      Knee/Hip Exercises: Standing   Heel Raises 10 reps;Right    Wall Squat 10 reps      Shoulder Exercises: Supine   Protraction Strengthening;Left;10 reps      Shoulder Exercises: Sidelying   External Rotation AROM;Strengthening;Left;10 reps    External Rotation Limitations notes some discomfort in L biceps with this exercise    Other Sidelying Exercises thoracic opening in sidelying x 10 reps (notes some mild popping sensations)      Shoulder Exercises: Standing   Horizontal ABduction Strengthening;Both;12 reps    Horizontal ABduction Limitations pool noodle at spine for cues on scapular retraction    Retraction Strengthening;Both;12 reps    Retraction Limitations pool noodle squeezes    Diagonals AROM;Strengthening;Both;5 reps    Diagonals Limitations with pool noodle at spine alternating diagonals      Shoulder Exercises: ROM/Strengthening    UBE (Upper Arm Bike) L3 x 2.5 minutes forward/1 minute backwards on compliant surface/ foam      Shoulder Exercises: Isometric Strengthening   External Rotation Supine;3X5"    Internal Rotation Supine;3X5"      Shoulder Exercises: Stretch   Corner Stretch 30 seconds;2 reps    Corner Stretch Limitations W position with cues onposture in doorframe      Manual Therapy   Manual Therapy Soft tissue mobilization    Soft tissue mobilization STM L biceps and anterior deltoid                  PT Education - 08/21/20 1542    Education Details HEP progressed for LE    Person(s) Educated Patient    Methods Explanation;Demonstration;Handout    Comprehension Verbalized understanding;Returned demonstration               PT Long Term Goals - 08/09/20 1310      PT LONG TERM GOAL #1   Title I with advanced HEP    Time 6    Period Weeks    Status New    Target Date 09/20/20      PT LONG TERM GOAL #2   Title report decreased shoulder pain to allow him to sleep per his previous level    Time 6    Period Weeks    Status New    Target Date 09/20/20      PT LONG TERM GOAL #3   Title report =/> 75% reductioon in Rt knee pain with daily activity    Time 6    Period Weeks    Status New    Target Date 09/20/20      PT LONG TERM GOAL #4   Title improve FOTO 52%    Time 6    Period Weeks    Status New    Target Date 09/20/20      PT LONG TERM GOAL #5   Title improve strength of Lt shoulder ER and upper back 5-/5    Time 6    Period Weeks    Status New    Target Date 09/20/20                 Plan - 08/21/20 1543    Clinical Impression Statement The patient has functional ROM in the L shoulder.  PT working on muscle length in biceps and pec.  The patient is c/o mild knee discomfort with walking and PT progressed some strengthening R LE to standing.  Continue  progressing to patient tolerance.    PT Treatment/Interventions Iontophoresis 4mg /ml  Dexamethasone;Taping;Vasopneumatic Device;Patient/family education;Functional mobility training;Moist Heat;Passive range of motion;Therapeutic activities;Cryotherapy;Electrical Stimulation;Neuromuscular re-education;Manual techniques;Therapeutic exercise    PT Next Visit Plan progress L UE strengthening, postural stabilization, R knee/quad strength    PT Home Exercise Plan [id number]    Consulted and Agree with Plan of Care Patient           Patient will benefit from skilled therapeutic intervention in order to improve the following deficits and impairments:     Visit Diagnosis: Chronic left shoulder pain  Acute pain of right knee  Muscle weakness (generalized)     Problem List Patient Active Problem List   Diagnosis Date Noted  . Controlled type 2 diabetes mellitus without complication, without long-term current use of insulin ([place]) 10/14/2017  . Dyslipidemia 09/15/2017  . Recurrent acute deep vein thrombosis (DVT) of lower extremity ([place]) 03/03/2017  . Pure hyperglyceridemia 03/03/2017  . Diabetes mellitus ([place]) 03/03/2017  . Acute deep vein thrombosis (DVT) of femoral vein of right lower extremity ([place])   . PFO (patent foramen ovale)   . Cerebrovascular accident (CVA) due to thrombosis of basilar artery ([place]) 01/26/2017  . BPH (benign prostatic hyperplasia) 01/26/2017  . Drug-induced bradycardia 08/01/2014  . Coronary artery disease without angina pectoris 08/06/2013  . Essential hypertension 08/06/2013  . Hyperlipidemia 08/06/2013    Freddy Spadafora 08/21/2020, 3:55 PM  [facility] [place], [place], [address] Phone: [phone]   Fax:  [phone]  Name: Jacob Hensley MRN: [id number] Date of Birth: 21-Apr-1946

## 2020-08-23 ENCOUNTER — Ambulatory Visit (INDEPENDENT_AMBULATORY_CARE_PROVIDER_SITE_OTHER): Payer: Medicare Other | Admitting: Physical Therapy

## 2020-08-23 ENCOUNTER — Other Ambulatory Visit: Payer: Self-pay

## 2020-08-23 DIAGNOSIS — M25561 Pain in right knee: Secondary | ICD-10-CM

## 2020-08-23 DIAGNOSIS — Z85828 Personal history of other malignant neoplasm of skin: Secondary | ICD-10-CM | POA: Diagnosis not present

## 2020-08-23 DIAGNOSIS — Z85820 Personal history of malignant melanoma of skin: Secondary | ICD-10-CM | POA: Diagnosis not present

## 2020-08-23 DIAGNOSIS — D485 Neoplasm of uncertain behavior of skin: Secondary | ICD-10-CM | POA: Diagnosis not present

## 2020-08-23 DIAGNOSIS — L918 Other hypertrophic disorders of the skin: Secondary | ICD-10-CM | POA: Diagnosis not present

## 2020-08-23 DIAGNOSIS — G8929 Other chronic pain: Secondary | ICD-10-CM

## 2020-08-23 DIAGNOSIS — M6281 Muscle weakness (generalized): Secondary | ICD-10-CM

## 2020-08-23 DIAGNOSIS — M25512 Pain in left shoulder: Secondary | ICD-10-CM | POA: Diagnosis not present

## 2020-08-23 DIAGNOSIS — L821 Other seborrheic keratosis: Secondary | ICD-10-CM | POA: Diagnosis not present

## 2020-08-23 DIAGNOSIS — D0461 Carcinoma in situ of skin of right upper limb, including shoulder: Secondary | ICD-10-CM | POA: Diagnosis not present

## 2020-08-23 DIAGNOSIS — L570 Actinic keratosis: Secondary | ICD-10-CM | POA: Diagnosis not present

## 2020-08-23 NOTE — Therapy (Signed)
[place], [place], [address] Phone: [phone]   Fax:  [phone]  Physical Therapy Treatment  Patient Details  Name: Jacob Hensley MRN: [id number] Date of Birth: 12-Nov-1945 Referring Provider (PT): Dr Lynne Leader   Encounter Date: 08/23/2020   PT End of Session - 08/23/20 1024    Visit Number 4    Number of Visits 12    Date for PT Re-Evaluation 09/20/20    Authorization Type MCR    Progress Note Due on Visit 10    PT Start Time 1022    PT Stop Time 1104    PT Time Calculation (min) 42 min    Activity Tolerance Patient tolerated treatment well    Behavior During Therapy [facility] for tasks assessed/performed           Past Medical History:  Diagnosis Date  . BPH (benign prostatic hyperplasia)   . Coronary artery disease    CABG 2003-DR. CROITORU IS PT'S CARDIOLOGIST - LAST OFFICE VISIT 08/02/11-PT EXERCISES DAILY-NO C/O OF CHEST PAIN  . Diabetes mellitus    BORDERLINE - PT CHECKS HIS BLOOD SUGARS AT HOME - NO MEDS  . DVT of leg (deep venous thrombosis) ([place]) 2003   S/P CABG SURGERY  . Hyperlipidemia   . Hypertension   . Stroke ([place])   . Urine retention    PT STATES HE HAS BEEN DOING I&O CATHS FOR PAST 3 YRS    Past Surgical History:  Procedure Laterality Date  . CORONARY ARTERY BYPASS GRAFT  02/05/2002   LIMA to LAD,RIMA to PDA,SVG to diagonal & free left radial arter to the oblique marginal artery - Dr. Cyndia Bent  . CPET w/PFT  1//20/2014   Normal  . CYSTOSCOPY WITH BIOPSY  11/08/2011   Procedure: CYSTOSCOPY WITH BIOPSY;  Surgeon: Fredricka Bonine, MD;  [facility];  Service: Urology;;  Bladder biopsies  . CYSTOSCOPY WITH BIOPSY  06/19/2012   Procedure: CYSTOSCOPY WITH BIOPSY;  Surgeon: Fredricka Bonine, MD;  Location: [facility];  Service: Urology;  Laterality: N/A;  fulgaration of bleeders  . myolema of back  2012  . TONSILLECTOMY    . TRANSURETHRAL RESECTION OF  PROSTATE  11-08-2011  . US ECHOCARDIOGRAPHY  04/14/2009   EF >50%,borderline dilated RV w/normal systolic fx,mildly dilated LA,mild AI,MR,TR.    There were no vitals filed for this visit.   Subjective Assessment - 08/23/20 1024    Subjective Pt reports he walked on treadmill at gym 2 days ago, at 3.8mh; no issues with Rt knee.  His shoulder is not painful during the day, but it woke him up yesterday in the night.    Currently in Pain? No/denies              [facility]      Assessment   Medical Diagnosis Chronic Lt shoulder pain and Rt knee pain    Referring Provider (PT) Dr ELynne Leader   Onset Date/Surgical Date 11/08/19    Hand Dominance Right    Next MD Visit 09/18/20      Strength   Right/Left Shoulder Left    Left Shoulder External Rotation 4-/5            [facility] Adult PT Treatment/Exercise - 08/23/20 0001      Knee/Hip Exercises: Stretches   Passive Hamstring Stretch Right;2 reps;Left;1 rep;30 seconds   seated with hip hinge   Quad Stretch Right;2 reps;Left;1 rep;30 seconds  seated with foot under seat   Gastroc Stretch Limitations verbally reviewed.      Shoulder Exercises: Supine   Flexion AROM;Left;5 reps    Other Supine Exercises snow angels x 5 reps, leading with thumb and no noodle - improved tolerance.      Shoulder Exercises: ROM/Strengthening   UBE (Upper Arm Bike) L3: 1.5 min each direction, standing      Shoulder Exercises: Stretch   Other Shoulder Stretches 3 position doorway stretch for pecs x 15 sec x 2 reps each; cues for form.      Manual Therapy   Soft tissue mobilization IASTM and STM to Lt pec major/minor, coracobrachialis, ant/lateral deltoid, lat, subscap to decrease fascial restrictions and improve ROM                  PT Education - 08/23/20 1311    Education Details updated HEP with LE stretches.    Person(s) Educated Patient    Methods Explanation   pt declined handout; uses the app   Comprehension  Verbalized understanding               PT Long Term Goals - 08/23/20 1259      PT LONG TERM GOAL #1   Title I with advanced HEP    Time 6    Period Weeks    Status On-going      PT LONG TERM GOAL #2   Title report decreased shoulder pain to allow him to sleep per his previous level    Baseline improved from eval, but still wakes in night from pain in shoulder    Time 6    Period Weeks    Status Partially Met      PT LONG TERM GOAL #3   Title report =/> 75% reductioon in Rt knee pain with daily activity    Time 6    Period Weeks    Status Achieved      PT LONG TERM GOAL #4   Title improve FOTO 52%    Time 6    Period Weeks    Status On-going      PT LONG TERM GOAL #5   Title improve strength of Lt shoulder ER and upper back 5-/5    Time 6    Period Weeks    Status On-going                 Plan - 08/23/20 1255    Clinical Impression Statement Session focused on manual therapy to release ant Lt shoulder tightness; palpable tightness in pec major/minor, coracobrachialis and biceps brachii.  Intended to utilize ktape to area for decompressing tissue and improving proprioception, but pt has dermatology appt today; will plan to apply next visit if indicated.  Pt continues with weakness in Lt ER strength. Pt will benefit from continued PT intervention to improve LUE strength and mobility .    Rehab Potential Excellent    PT Frequency 2x / week    PT Duration 6 weeks    PT Treatment/Interventions Iontophoresis 35m/ml Dexamethasone;Taping;Vasopneumatic Device;Patient/family education;Functional mobility training;Moist Heat;Passive range of motion;Therapeutic activities;Cryotherapy;Electrical Stimulation;Neuromuscular re-education;Manual techniques;Therapeutic exercise    PT Next Visit Plan issue exercises for ER (to tolerance); DN/manual therapy to Lt ant shoulder and tape if indicated.    PT Home Exercise Plan [id number]    Consulted and Agree with Plan of Care Patient            Patient will benefit from skilled therapeutic intervention in  order to improve the following deficits and impairments:  Impaired UE functional use,Pain,Decreased strength,Postural dysfunction  Visit Diagnosis: Chronic left shoulder pain  Acute pain of right knee  Muscle weakness (generalized)     Problem List Patient Active Problem List   Diagnosis Date Noted  . Controlled type 2 diabetes mellitus without complication, without long-term current use of insulin ([place]) 10/14/2017  . Dyslipidemia 09/15/2017  . Recurrent acute deep vein thrombosis (DVT) of lower extremity ([place]) 03/03/2017  . Pure hyperglyceridemia 03/03/2017  . Diabetes mellitus ([place]) 03/03/2017  . Acute deep vein thrombosis (DVT) of femoral vein of right lower extremity ([place])   . PFO (patent foramen ovale)   . Cerebrovascular accident (CVA) due to thrombosis of basilar artery ([place]) 01/26/2017  . BPH (benign prostatic hyperplasia) 01/26/2017  . Drug-induced bradycardia 08/01/2014  . Coronary artery disease without angina pectoris 08/06/2013  . Essential hypertension 08/06/2013  . Hyperlipidemia 08/06/2013   Kerin Perna, PTA 08/23/20 1:14 PM  [facility] [place], [place], [address] Phone: [phone]   Fax:  [phone]  Name: Jacob Hensley MRN: [id number] Date of Birth: [birth year]

## 2020-08-28 ENCOUNTER — Other Ambulatory Visit: Payer: Self-pay

## 2020-08-28 ENCOUNTER — Ambulatory Visit (INDEPENDENT_AMBULATORY_CARE_PROVIDER_SITE_OTHER): Payer: Medicare Other | Admitting: Physical Therapy

## 2020-08-28 ENCOUNTER — Encounter: Payer: Self-pay | Admitting: Physical Therapy

## 2020-08-28 DIAGNOSIS — M25512 Pain in left shoulder: Secondary | ICD-10-CM | POA: Diagnosis not present

## 2020-08-28 DIAGNOSIS — M6281 Muscle weakness (generalized): Secondary | ICD-10-CM | POA: Diagnosis not present

## 2020-08-28 DIAGNOSIS — G8929 Other chronic pain: Secondary | ICD-10-CM

## 2020-08-28 DIAGNOSIS — M25561 Pain in right knee: Secondary | ICD-10-CM

## 2020-08-28 NOTE — Therapy (Signed)
[place], [place], [address] Phone: [phone]   Fax:  [phone]  Physical Therapy Treatment  Patient Details  Name: Jacob Hensley MRN: [id number] Date of Birth: 1946/04/15 Referring Provider (PT): Dr Lynne Leader   Encounter Date: 08/28/2020   PT End of Session - 08/28/20 1100    Visit Number 5    Number of Visits 12    Date for PT Re-Evaluation 09/20/20    Authorization Type MCR    Progress Note Due on Visit 10    PT Start Time 1101    PT Stop Time 1151    PT Time Calculation (min) 50 min    Activity Tolerance Patient tolerated treatment well    Behavior During Therapy [facility] for tasks assessed/performed           Past Medical History:  Diagnosis Date  . BPH (benign prostatic hyperplasia)   . Coronary artery disease    CABG 2003-DR. CROITORU IS PT'S CARDIOLOGIST - LAST OFFICE VISIT 08/02/11-PT EXERCISES DAILY-NO C/O OF CHEST PAIN  . Diabetes mellitus    BORDERLINE - PT CHECKS HIS BLOOD SUGARS AT HOME - NO MEDS  . DVT of leg (deep venous thrombosis) ([place]) 2003   S/P CABG SURGERY  . Hyperlipidemia   . Hypertension   . Stroke ([place])   . Urine retention    PT STATES HE HAS BEEN DOING I&O CATHS FOR PAST 3 YRS    Past Surgical History:  Procedure Laterality Date  . CORONARY ARTERY BYPASS GRAFT  02/05/2002   LIMA to LAD,RIMA to PDA,SVG to diagonal & free left radial arter to the oblique marginal artery - Dr. Cyndia Bent  . CPET w/PFT  1//20/2014   Normal  . CYSTOSCOPY WITH BIOPSY  11/08/2011   Procedure: CYSTOSCOPY WITH BIOPSY;  Surgeon: Fredricka Bonine, MD;  Location: [facility];  Service: Urology;;  Bladder biopsies  . CYSTOSCOPY WITH BIOPSY  06/19/2012   Procedure: CYSTOSCOPY WITH BIOPSY;  Surgeon: Fredricka Bonine, MD;  Location: [facility];  Service: Urology;  Laterality: N/A;  fulgaration of bleeders  . myolema of back  2012  . TONSILLECTOMY    . TRANSURETHRAL RESECTION OF  PROSTATE  11-08-2011  . US ECHOCARDIOGRAPHY  04/14/2009   EF >50%,borderline dilated RV w/normal systolic fx,mildly dilated LA,mild AI,MR,TR.    There were no vitals filed for this visit.   Subjective Assessment - 08/28/20 1101    Subjective Pt was able to paint yesterday, Pain in Lt shoulder is intermittent now. Is going to the gym also and doing the leg press and knee extension.  No pain at this time.    Aggravating Factors  has Rt shoulder with internal rotation              [facility] PT Assessment - 08/28/20 0001      Assessment   Medical Diagnosis Chronic Lt shoulder pain and Rt knee pain      Flexibility   Quadriceps Rt knee prone flexion to 144 degrees, 0.50 inches from buttocks                         [facility] Adult PT Treatment/Exercise - 08/28/20 0001      Knee/Hip Exercises: Aerobic   Nustep L5:  arms/legs x 6 min for warm up.      Knee/Hip Exercises: Standing   SLS 10 reps single leg Rt sit to stand from high mat, Rt knee  adducts - required VC for form      Knee/Hip Exercises: Supine   Straight Leg Raise with External Rotation Strengthening;Right;10 reps   with hip ab/adduction   Patellar Mobs good mobilization Rt      Knee/Hip Exercises: Sidelying   Other Sidelying Knee/Hip Exercises 10 reps each pilates FWD/BWD kicks, CW/CCW circles  and FWD/BWD arcs      Shoulder Exercises: Stretch   Other Shoulder Stretches 10 reps open book stretch Lt UE    Other Shoulder Stretches hooklying goal post to scarescrow, goal post to overhead flexion      Modalities   Modalities Moist Heat      Moist Heat Therapy   Number Minutes Moist Heat 10 Minutes    Moist Heat Location --   Lt pecs     Manual Therapy   Manual therapy comments skilled palpation and monitoring of soft tissue during DN    Soft tissue mobilization STM to Lt pecs with pin and stretch            Trigger Point Dry Needling - 08/28/20 0001    Consent Given? Yes    Education Handout Provided  Yes    Muscles Treated Upper Quadrant Pectoralis minor;Pectoralis major   Lt   Pectoralis Major Response Palpable increased muscle length    Pectoralis Minor Response Palpable increased muscle length                     PT Long Term Goals - 08/23/20 1259      PT LONG TERM GOAL #1   Title I with advanced HEP    Time 6    Period Weeks    Status On-going      PT LONG TERM GOAL #2   Title report decreased shoulder pain to allow him to sleep per his previous level    Baseline improved from eval, but still wakes in night from pain in shoulder    Time 6    Period Weeks    Status Partially Met      PT LONG TERM GOAL #3   Title report =/> 75% reductioon in Rt knee pain with daily activity    Time 6    Period Weeks    Status Achieved      PT LONG TERM GOAL #4   Title improve FOTO 52%    Time 6    Period Weeks    Status On-going      PT LONG TERM GOAL #5   Title improve strength of Lt shoulder ER and upper back 5-/5    Time 6    Period Weeks    Status On-going                 Plan - 08/28/20 1104    Clinical Impression Statement [name] tolerated manual work and DN to [employer] with some decrease pain of the shoulder with IR. He reports this is the only motion that causes him discomfort.  He continues with intermittent Rt knee pain, It was observed that he had knee adduction with functional tasks on single leg work.  This would be the cause of some knee pain.  His quad flexibility has improved greatly. He would benefit from glut med strengthening.    Rehab Potential Excellent    PT Frequency 2x / week    PT Duration 6 weeks    PT Treatment/Interventions Iontophoresis 9m/ml Dexamethasone;Taping;Vasopneumatic Device;Patient/family education;Functional mobility training;Moist Heat;Passive range of motion;Therapeutic activities;Cryotherapy;Electrical Stimulation;Neuromuscular  re-education;Manual techniques;Therapeutic exercise    PT Next Visit Plan assess response to DN,  strengthen Lt RTC and hip abdcutors    PT Home Exercise Plan [email or web address]    Consulted and Agree with Plan of Care Patient           Patient will benefit from skilled therapeutic intervention in order to improve the following deficits and impairments:  Impaired UE functional use,Pain,Decreased strength,Postural dysfunction  Visit Diagnosis: Chronic left shoulder pain  Acute pain of right knee  Muscle weakness (generalized)     Problem List Patient Active Problem List   Diagnosis Date Noted  . Controlled type 2 diabetes mellitus without complication, without long-term current use of insulin ([place]) 10/14/2017  . Dyslipidemia 09/15/2017  . Recurrent acute deep vein thrombosis (DVT) of lower extremity ([place]) 03/03/2017  . Pure hyperglyceridemia 03/03/2017  . Diabetes mellitus ([place]) 03/03/2017  . Acute deep vein thrombosis (DVT) of femoral vein of right lower extremity ([place])   . PFO (patent foramen ovale)   . Cerebrovascular accident (CVA) due to thrombosis of basilar artery ([place]) 01/26/2017  . BPH (benign prostatic hyperplasia) 01/26/2017  . Drug-induced bradycardia 08/01/2014  . Coronary artery disease without angina pectoris 08/06/2013  . Essential hypertension 08/06/2013  . Hyperlipidemia 08/06/2013    Jeral Pinch PT  08/28/2020, 12:44 PM  [facility] [place], [place], [address] Phone: [phone]   Fax:  [phone]  Name: Jacob Hensley MRN: [id number] Date of Birth: 05-09-1946

## 2020-08-31 ENCOUNTER — Ambulatory Visit (INDEPENDENT_AMBULATORY_CARE_PROVIDER_SITE_OTHER): Payer: Medicare Other | Admitting: Physical Therapy

## 2020-08-31 ENCOUNTER — Encounter: Payer: Self-pay | Admitting: Physical Therapy

## 2020-08-31 ENCOUNTER — Other Ambulatory Visit: Payer: Self-pay

## 2020-08-31 DIAGNOSIS — M25561 Pain in right knee: Secondary | ICD-10-CM | POA: Diagnosis not present

## 2020-08-31 DIAGNOSIS — G8929 Other chronic pain: Secondary | ICD-10-CM

## 2020-08-31 DIAGNOSIS — M25512 Pain in left shoulder: Secondary | ICD-10-CM

## 2020-08-31 DIAGNOSIS — M6281 Muscle weakness (generalized): Secondary | ICD-10-CM | POA: Diagnosis not present

## 2020-08-31 NOTE — Therapy (Addendum)
[facility] [place], [place], [address] Phone: [phone]   Fax:  [phone]  Physical Therapy Treatment and Discharge  Patient Details  Name: Jacob Hensley MRN: [id number] Date of Birth: Aug 20, 1945 Referring Provider (PT): Dr Lynne Leader   Encounter Date: 08/31/2020   PT End of Session - 08/31/20 1402    Visit Number 6    Number of Visits 12    Date for PT Re-Evaluation 09/20/20    Authorization Type MCR    Progress Note Due on Visit 10    PT Start Time 0569    PT Stop Time 1442    PT Time Calculation (min) 38 min    Activity Tolerance Patient tolerated treatment well    Behavior During Therapy [facility] for tasks assessed/performed           Past Medical History:  Diagnosis Date  . BPH (benign prostatic hyperplasia)   . Coronary artery disease    CABG 2003-DR. CROITORU IS PT'S CARDIOLOGIST - LAST OFFICE VISIT 08/02/11-PT EXERCISES DAILY-NO C/O OF CHEST PAIN  . Diabetes mellitus    BORDERLINE - PT CHECKS HIS BLOOD SUGARS AT HOME - NO MEDS  . DVT of leg (deep venous thrombosis) ([place]) 2003   S/P CABG SURGERY  . Hyperlipidemia   . Hypertension   . Stroke ([place])   . Urine retention    PT STATES HE HAS BEEN DOING I&O CATHS FOR PAST 3 YRS    Past Surgical History:  Procedure Laterality Date  . CORONARY ARTERY BYPASS GRAFT  02/05/2002   LIMA to LAD,RIMA to PDA,SVG to diagonal & free left radial arter to the oblique marginal artery - Dr. Cyndia Bent  . CPET w/PFT  1//20/2014   Normal  . CYSTOSCOPY WITH BIOPSY  11/08/2011   Procedure: CYSTOSCOPY WITH BIOPSY;  Surgeon: Fredricka Bonine, MD;  Location: [facility];  Service: Urology;;  Bladder biopsies  . CYSTOSCOPY WITH BIOPSY  06/19/2012   Procedure: CYSTOSCOPY WITH BIOPSY;  Surgeon: Fredricka Bonine, MD;  Location: [facility];  Service: Urology;  Laterality: N/A;  fulgaration of bleeders  . myolema of back  2012  . TONSILLECTOMY    . TRANSURETHRAL  RESECTION OF PROSTATE  11-08-2011  . US ECHOCARDIOGRAPHY  04/14/2009   EF >50%,borderline dilated RV w/normal systolic fx,mildly dilated LA,mild AI,MR,TR.    There were no vitals filed for this visit.   Subjective Assessment - 08/31/20 1405    Subjective Pt reports he didn't sleep well; couldn't get comfortable.  He is going to gym and lifting with UE/LE. He didn't notice much difference with DN to shoulder.  He'd like to hold therapy until MD appt.    Currently in Pain? No/denies              [facility] PT Assessment - 08/31/20 0001      Assessment   Medical Diagnosis Chronic Lt shoulder pain and Rt knee pain    Referring Provider (PT) Dr Lynne Leader    Onset Date/Surgical Date 11/08/19    Hand Dominance Right    Next MD Visit 09/18/20    Prior Therapy yes      Strength   Overall Strength Comments Lt rhomboid /midtrap - 5/5 with compensation    Left Shoulder External Rotation 4/5   pain with MMT           [facility] Adult PT Treatment/Exercise - 08/31/20 0001      Shoulder Exercises: Sidelying   Other  Sidelying Exercises open book Lt thoracic rotation with pec stretch x 8 reps      Shoulder Exercises: Standing   External Rotation Strengthening;Both;12 reps    Theraband Level (Shoulder External Rotation) Level 2 (Red)    Row Limitations verbally reviewed; recommended bilateral so as to not compensate with trunk.    Other Standing Exercises standing snow angels to demo ROM, x 5 reps - painful arc noted.    Other Standing Exercises bilat bicep curl with 5# each hand x 10 (some discomfort noted in ant shoulder).      Shoulder Exercises: ROM/Strengthening   Lat Pull Limitations tricep ext x 10 with 1 plate    Cybex Press 1 plate   5 reps   Plank Limitations verbally reviewed plank form and parameters with modifications; pt verbalized understanding.      Modalities   Modalities Iontophoresis      Iontophoresis   Type of Iontophoresis Dexamethasone    Location Lt ant shoulder/  biceps brachii tendon    Dose 1.0cc    Time 80 mA stat patch, 4 hr wear time.                       PT Long Term Goals - 08/31/20 1414      PT LONG TERM GOAL #1   Title I with advanced HEP    Time 6    Period Weeks    Status Achieved      PT LONG TERM GOAL #2   Title report decreased shoulder pain to allow him to sleep per his previous level    Baseline improved from eval, but still occasionally wakes in night from pain in shoulder    Time 6    Period Weeks    Status Partially Met      PT LONG TERM GOAL #3   Title report =/> 75% reductioon in Rt knee pain with daily activity    Time 6    Period Weeks    Status Achieved      PT LONG TERM GOAL #4   Title improve FOTO 52%    Baseline 24% limited at intake, 31% limited 08/31/20    Time 6    Period Weeks    Status On-going      PT LONG TERM GOAL #5   Title improve strength of Lt shoulder ER and upper back 5-/5    Time 6    Period Weeks    Status Partially Met                 Plan - 08/31/20 1437    Clinical Impression Statement Pt has returned to gym, using cardio and weight machines for LE without any increase in symptoms in Rt knee.  He has limited exercises for shoulder to ones received in PT.  Verbally reviewed current HEP and trialed other UE exercises in clinic to add to program. He continues with painful arc and with some crepitus at times with Lt shoulder abdct.   Pt reported pain in Lt ant shoulder with 12# reclined chest press today, limited to 5 reps.  Trial of ionto initiated today for point tender area of Lt ant shoulder.  Pt has partially met his goals and requests to hold therapy until MD appt.    Rehab Potential Excellent    PT Frequency 2x / week    PT Duration 6 weeks    PT Treatment/Interventions Iontophoresis 50m/ml Dexamethasone;Taping;Vasopneumatic Device;Patient/family education;Functional mobility training;Moist  Heat;Passive range of motion;Therapeutic  activities;Cryotherapy;Electrical Stimulation;Neuromuscular re-education;Manual techniques;Therapeutic exercise    PT Next Visit Plan await further recommendations from MD at upcoming appt.  Hold PT.    PT Home Exercise Plan [id number]    Consulted and Agree with Plan of Care Patient           Patient will benefit from skilled therapeutic intervention in order to improve the following deficits and impairments:  Impaired UE functional use,Pain,Decreased strength,Postural dysfunction  Visit Diagnosis: Chronic left shoulder pain  Acute pain of right knee  Muscle weakness (generalized)     Problem List Patient Active Problem List   Diagnosis Date Noted  . Controlled type 2 diabetes mellitus without complication, without long-term current use of insulin ([place]) 10/14/2017  . Dyslipidemia 09/15/2017  . Recurrent acute deep vein thrombosis (DVT) of lower extremity ([place]) 03/03/2017  . Pure hyperglyceridemia 03/03/2017  . Diabetes mellitus ([place]) 03/03/2017  . Acute deep vein thrombosis (DVT) of femoral vein of right lower extremity ([place])   . PFO (patent foramen ovale)   . Cerebrovascular accident (CVA) due to thrombosis of basilar artery ([place]) 01/26/2017  . BPH (benign prostatic hyperplasia) 01/26/2017  . Drug-induced bradycardia 08/01/2014  . Coronary artery disease without angina pectoris 08/06/2013  . Essential hypertension 08/06/2013  . Hyperlipidemia 08/06/2013   PHYSICAL THERAPY DISCHARGE SUMMARY  Visits from Start of Care: 6  Current functional level related to goals / functional outcomes: Decreased knee pain, improving shoulder strength   Remaining deficits: See above   Education / Equipment: HEP Plan: Patient agrees to discharge.  Patient goals were partially met. Patient is being discharged due to being pleased with the current functional level.  ?????     Isabelle Course, PT,DPT03/23/223:02 PM  Shelbie Hutching 08/31/2020, 4:07 PM  [facility] [place], [place], [address] Phone: [phone]   Fax:  [phone]  Name: Jacob Hensley MRN: [id number] Date of Birth: 14-Sep-1945

## 2020-09-10 DIAGNOSIS — S0990XA Unspecified injury of head, initial encounter: Secondary | ICD-10-CM | POA: Diagnosis not present

## 2020-09-10 DIAGNOSIS — S4992XA Unspecified injury of left shoulder and upper arm, initial encounter: Secondary | ICD-10-CM | POA: Diagnosis not present

## 2020-09-10 DIAGNOSIS — S199XXA Unspecified injury of neck, initial encounter: Secondary | ICD-10-CM | POA: Diagnosis not present
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
## 2020-09-20 ENCOUNTER — Ambulatory Visit (INDEPENDENT_AMBULATORY_CARE_PROVIDER_SITE_OTHER): Payer: Medicare Other | Admitting: Family Medicine

## 2020-09-20 ENCOUNTER — Ambulatory Visit: Payer: Self-pay

## 2020-09-20 ENCOUNTER — Other Ambulatory Visit: Payer: Self-pay

## 2020-09-20 VITALS — BP 124/75 | HR 60 | Ht 69.0 in | Wt 178.2 lb

## 2020-09-20 DIAGNOSIS — M25512 Pain in left shoulder: Secondary | ICD-10-CM | POA: Diagnosis not present

## 2020-09-20 DIAGNOSIS — G8929 Other chronic pain: Secondary | ICD-10-CM

## 2020-09-20 NOTE — Patient Instructions (Signed)
Thank you for coming in today.  Continue home exercise.   Recheck in 1 month.   If not significantly better we can consider injection or MRI.

## 2020-10-04 ENCOUNTER — Telehealth: Payer: Self-pay | Admitting: *Deleted

## 2020-10-04 NOTE — Telephone Encounter (Signed)
Medication Samples have been provided to the patient.  Drug name: Eliquis       Strength: 5 mg        Qty: 1 box  LOT: [id number]  Exp.Date: 4/24  Patient has been made aware that it looks like PCP is handling his Eliquis but a box of samples was given so that he does not run out.

## 2020-10-09 DIAGNOSIS — E785 Hyperlipidemia, unspecified: Secondary | ICD-10-CM | POA: Diagnosis not present

## 2020-10-09 DIAGNOSIS — N182 Chronic kidney disease, stage 2 (mild): Secondary | ICD-10-CM | POA: Diagnosis not present

## 2020-10-09 DIAGNOSIS — E1122 Type 2 diabetes mellitus with diabetic chronic kidney disease: Secondary | ICD-10-CM | POA: Diagnosis not present

## 2020-10-10 DIAGNOSIS — E785 Hyperlipidemia, unspecified: Secondary | ICD-10-CM | POA: Diagnosis not present

## 2020-10-10 DIAGNOSIS — Z7901 Long term (current) use of anticoagulants: Secondary | ICD-10-CM | POA: Diagnosis not present

## 2020-10-10 DIAGNOSIS — I129 Hypertensive chronic kidney disease with stage 1 through stage 4 chronic kidney disease, or unspecified chronic kidney disease: Secondary | ICD-10-CM | POA: Diagnosis not present

## 2020-10-10 DIAGNOSIS — E1122 Type 2 diabetes mellitus with diabetic chronic kidney disease: Secondary | ICD-10-CM | POA: Diagnosis not present

## 2020-10-10 DIAGNOSIS — Z86718 Personal history of other venous thrombosis and embolism: Secondary | ICD-10-CM | POA: Diagnosis not present

## 2020-10-10 DIAGNOSIS — I2510 Atherosclerotic heart disease of native coronary artery without angina pectoris: Secondary | ICD-10-CM | POA: Diagnosis not present

## 2020-10-25 NOTE — Progress Notes (Signed)
COVID Vaccine Completed: Date COVID Vaccine completed: Has received booster: COVID vaccine manufacturer: [place]   Date of COVID positive in last 90 days:  PCP - Deland Pretty, MD Cardiologist - Sanda Klein, MD  Chest x-ray -  EKG - 02-18-20 Epic Stress Test - 03-09-20 Epic ECHO - >2 years Cardiac Cath -  Pacemaker/ICD device last checked: Spinal Cord Stimulator:  Sleep Study -  CPAP -   Fasting Blood Sugar -  Checks Blood Sugar _____ times a day  Blood Thinner Instructions:  Eliquis 5 mg BID Aspirin Instructions: Last Dose:  Activity level:  Can go up a flight of stairs and perform activities of daily living without stopping and without symptoms of chest pain or shortness of breath.   Able to exercise without symptoms  Unable to go up a flight of stairs without symptoms of      Anesthesia review: CAD s/p CABG 2003, PFO, hx of CVA and DVT.  HTN, DM  Patient denies shortness of breath, fever, cough and chest pain at PAT appointment   Patient verbalized understanding of instructions that were given to them at the PAT appointment. Patient was also instructed that they will need to review over the PAT instructions again at home before surgery.

## 2020-10-26 ENCOUNTER — Other Ambulatory Visit: Payer: Self-pay

## 2020-10-26 ENCOUNTER — Encounter (HOSPITAL_COMMUNITY): Payer: Self-pay

## 2020-10-26 ENCOUNTER — Encounter (HOSPITAL_COMMUNITY): Payer: Self-pay | Admitting: General Surgery

## 2020-10-26 NOTE — Progress Notes (Signed)
Anesthesia Chart Review   Case: [id number] Date/Time: 10/31/20 1400   Procedure: RIGHT OPEN INGUINAL HERNIA REPAIR WITH MESH (Right ) - 60/RM5   Anesthesia type: General   Pre-op diagnosis: RIGHT INGUINAL HERNIA   Location: [place] [facility]   Surgeons: Kinsinger, Arta Bruce, MD      DISCUSSION:75 y.o. never smoker with h/o HTN, DM II, Stroke, DVT (on Eliquis, reports last dose 10/28/2020), CAD (CABG 2003), right inguinal hernia scheduled for above procedure 10/31/2020 with Dr. Gurney Maxin.   Per cardiology preoperative evaluation 04/11/2020, "Chart reviewed as part of pre-operative protocol coverage. Patient was contacted 04/11/2020 in reference to pre-operative risk assessment for pending surgery as outlined below.  HELIX LAFONTAINE was last seen on 02/18/2020 by Dr. Sallyanne Kuster.  Since that day, DAMIR LEUNG has done well without chest pain or shortness of breath. He has great exercise level. Recent plain old treadmill test was unchanged. (note, his treadmill test always come back abnormal sine the CABG, lack of change indicate no new critical lesion).   Therefore, based on ACC/AHA guidelines, the patient would be at acceptable risk for the planned procedure without further cardiovascular testing."  Anticipate pt can proceed with planned procedure barring acute status change.   VS: Ht 5\' 9"  (1.753 m)   Wt 75.8 kg   BMI 24.66 kg/m   PROVIDERS: Deland Pretty, MD is PCP   Croitoru, Dani Gobble, MD is Cardiologist  LABS: SDW, labs to be done DOS (all labs ordered are listed, but only abnormal results are displayed)  Labs Reviewed - No data to display   IMAGES:   EKG: 02/18/2020 Rate 51 bpm  Sinus bradycardia with 1st degree AV block  Nonspecific ST and T wave abnormality   CV: Echo 01/27/2017 Study Conclusions   - Left ventricle: The cavity size was normal. Wall thickness was  increased in a pattern of mild LVH. Systolic function was normal.  The estimated ejection  fraction was in the range of 60% to 65%.  Doppler parameters are consistent with abnormal left ventricular  relaxation (grade 1 diastolic dysfunction).  - Aortic valve: There was mild regurgitation.  Past Medical History:  Diagnosis Date  . BPH (benign prostatic hyperplasia)   . Coronary artery disease    CABG 2003-DR. CROITORU IS PT'S CARDIOLOGIST - LAST OFFICE VISIT 08/02/11-PT EXERCISES DAILY-NO C/O OF CHEST PAIN  . Diabetes mellitus    BORDERLINE - PT CHECKS HIS BLOOD SUGARS AT HOME - NO MEDS  . DVT of leg (deep venous thrombosis) ([place]) 2003   S/P CABG SURGERY  . Hyperlipidemia   . Hypertension   . Melanoma ([place])    Back  . Stroke ([place])   . Urine retention    PT STATES HE HAS BEEN DOING I&O CATHS FOR PAST 3 YRS    Past Surgical History:  Procedure Laterality Date  . CATARACT EXTRACTION W/ INTRAOCULAR LENS  IMPLANT, BILATERAL    . COLONOSCOPY     Dr. Verdia Kuba  . CORONARY ARTERY BYPASS GRAFT  02/05/2002   LIMA to LAD,RIMA to PDA,SVG to diagonal & free left radial arter to the oblique marginal artery - Dr. Cyndia Bent  . CPET w/PFT  1//20/2014   Normal  . CYSTOSCOPY WITH BIOPSY  11/08/2011   Procedure: CYSTOSCOPY WITH BIOPSY;  Surgeon: Fredricka Bonine, MD;  Location: [facility];  Service: Urology;;  Bladder biopsies  . CYSTOSCOPY WITH BIOPSY  06/19/2012   Procedure: CYSTOSCOPY WITH BIOPSY;  Surgeon: Fredricka Bonine, MD;  Location: [facility];  Service: Urology;  Laterality: N/A;  fulgaration of bleeders  . MELANOMA EXCISION  2012  . TONSILLECTOMY    . TRANSURETHRAL RESECTION OF PROSTATE  11-08-2011  . US ECHOCARDIOGRAPHY  04/14/2009   EF >50%,borderline dilated RV w/normal systolic fx,mildly dilated LA,mild AI,MR,TR.    MEDICATIONS: No current facility-administered medications for this encounter.   . hydrochlorothiazide (HYDRODIURIL) 25 MG tablet  . metFORMIN (GLUCOPHAGE) 500 MG tablet  . apixaban (ELIQUIS) 5 MG TABS tablet  . Ascorbic Acid (VITAMIN C) 1000 MG tablet   . metoprolol succinate (TOPROL-XL) 25 MG 24 hr tablet  . Multiple Vitamins-Minerals (MULTIVITAMIN WITH MINERALS) tablet  . ramipril (ALTACE) 10 MG capsule  . rosuvastatin (CRESTOR) 40 MG tablet    Konrad Felix, PA-C [facility] Pre-Surgical Testing [phone]

## 2020-10-26 NOTE — Progress Notes (Addendum)
COVID Vaccine Completed:No Date COVID Vaccine completed: N/A Has received booster:N/A COVID vaccine manufacturer: N/A  Date of COVID positive in last 90 days: No  PCP - Deland Pretty, MD Cardiologist - Sanda Klein, MD Neurology- Dr. Lanice Shirts, Claris Gower  Chest x-ray - greater than 1 year EKG - 02-18-20 Epic Stress Test - 03-09-20 Epic ECHO - >2 years Cardiac Cath - greater than 2 years Pacemaker/ICD device last checked: N/A Spinal Cord Stimulator:N/A  Sleep Study - N/A CPAP - N/A  Fasting Blood Sugar - 120-150's Checks Blood Sugar __2___ times a day  Blood Thinner Instructions:  Eliquis 5 mg BID last dose October 28, 2020 Aspirin Instructions: N/A Last Dose: N/A  Activity level:   Able to exercise without symptoms  Anesthesia review: CAD s/p CABG 2003, PFO, hx of CVA and DVT.  HTN, DM  Patient denies shortness of breath, fever, cough and chest pain at PAT appointment   Patient verbalized understanding of instructions that were given to them at the PAT appointment. Patient was also instructed that they will need to review over the PAT instructions again at home before surgery.

## 2020-10-27 ENCOUNTER — Other Ambulatory Visit (HOSPITAL_COMMUNITY)
Admission: RE | Admit: 2020-10-27 | Discharge: 2020-10-27 | Disposition: A | Discharge status: Home / Self Care | Payer: Medicare Other | Source: Ambulatory Visit | Attending: General Surgery | Admitting: General Surgery

## 2020-10-27 DIAGNOSIS — Z20822 Contact with and (suspected) exposure to covid-19: Secondary | ICD-10-CM | POA: Diagnosis not present

## 2020-10-27 DIAGNOSIS — Z01812 Encounter for preprocedural laboratory examination: Secondary | ICD-10-CM | POA: Diagnosis not present

## 2020-10-28 LAB — SARS CORONAVIRUS 2 (TAT 6-24 HRS): SARS Coronavirus 2: NEGATIVE

## 2020-10-31 ENCOUNTER — Encounter (HOSPITAL_COMMUNITY)
Admission: RE | Disposition: A | Discharge status: Home / Self Care | Payer: Self-pay | Source: Home / Self Care | Attending: General Surgery

## 2020-10-31 ENCOUNTER — Ambulatory Visit (HOSPITAL_COMMUNITY)
Admission: RE | Admit: 2020-10-31 | Discharge: 2020-10-31 | Disposition: A | Discharge status: Home / Self Care | Payer: Medicare Other | Attending: General Surgery | Admitting: General Surgery

## 2020-10-31 ENCOUNTER — Encounter (HOSPITAL_COMMUNITY): Payer: Self-pay | Admitting: General Surgery

## 2020-10-31 ENCOUNTER — Ambulatory Visit (HOSPITAL_COMMUNITY): Payer: Medicare Other | Admitting: Physician Assistant

## 2020-10-31 DIAGNOSIS — Z8673 Personal history of transient ischemic attack (TIA), and cerebral infarction without residual deficits: Secondary | ICD-10-CM | POA: Diagnosis not present

## 2020-10-31 DIAGNOSIS — Z951 Presence of aortocoronary bypass graft: Secondary | ICD-10-CM | POA: Insufficient documentation

## 2020-10-31 DIAGNOSIS — Z7901 Long term (current) use of anticoagulants: Secondary | ICD-10-CM | POA: Insufficient documentation

## 2020-10-31 DIAGNOSIS — Z85820 Personal history of malignant melanoma of skin: Secondary | ICD-10-CM | POA: Insufficient documentation

## 2020-10-31 DIAGNOSIS — Z86718 Personal history of other venous thrombosis and embolism: Secondary | ICD-10-CM | POA: Insufficient documentation

## 2020-10-31 DIAGNOSIS — I10 Essential (primary) hypertension: Secondary | ICD-10-CM | POA: Diagnosis not present

## 2020-10-31 DIAGNOSIS — K4090 Unilateral inguinal hernia, without obstruction or gangrene, not specified as recurrent: Secondary | ICD-10-CM | POA: Insufficient documentation

## 2020-10-31 DIAGNOSIS — I2510 Atherosclerotic heart disease of native coronary artery without angina pectoris: Secondary | ICD-10-CM | POA: Insufficient documentation

## 2020-10-31 DIAGNOSIS — E119 Type 2 diabetes mellitus without complications: Secondary | ICD-10-CM | POA: Diagnosis not present

## 2020-10-31 DIAGNOSIS — Z8249 Family history of ischemic heart disease and other diseases of the circulatory system: Secondary | ICD-10-CM | POA: Insufficient documentation

## 2020-10-31 DIAGNOSIS — Z79899 Other long term (current) drug therapy: Secondary | ICD-10-CM | POA: Insufficient documentation

## 2020-10-31 DIAGNOSIS — E785 Hyperlipidemia, unspecified: Secondary | ICD-10-CM | POA: Diagnosis not present

## 2020-10-31 DIAGNOSIS — N400 Enlarged prostate without lower urinary tract symptoms: Secondary | ICD-10-CM | POA: Diagnosis not present

## 2020-10-31 DIAGNOSIS — Z833 Family history of diabetes mellitus: Secondary | ICD-10-CM | POA: Diagnosis not present

## 2020-10-31 DIAGNOSIS — Z7984 Long term (current) use of oral hypoglycemic drugs: Secondary | ICD-10-CM | POA: Diagnosis not present

## 2020-10-31 DIAGNOSIS — Z803 Family history of malignant neoplasm of breast: Secondary | ICD-10-CM | POA: Diagnosis not present

## 2020-10-31 HISTORY — DX: Malignant melanoma of skin, unspecified: C43.9

## 2020-10-31 HISTORY — PX: INGUINAL HERNIA REPAIR: SHX194

## 2020-10-31 LAB — BASIC METABOLIC PANEL
Anion gap: 7 (ref 5–15)
BUN: 18 mg/dL (ref 8–23)
CO2: 24 mmol/L (ref 22–32)
Calcium: 9.4 mg/dL (ref 8.9–10.3)
Chloride: 106 mmol/L (ref 98–111)
Creatinine, Ser: 0.73 mg/dL (ref 0.61–1.24)
GFR, Estimated: >60 mL/min (ref >60)
Glucose, Bld: 114 mg/dL — ABNORMAL HIGH (ref 70–99)
Potassium: 4.1 mmol/L (ref 3.5–5.1)
Sodium: 137 mmol/L (ref 135–145)

## 2020-10-31 LAB — CBC
HCT: 44.6 % (ref 39.0–52.0)
Hemoglobin: 15.7 g/dL (ref 13.0–17.0)
MCH: 30.3 pg (ref 26.0–34.0)
MCHC: 35.2 g/dL (ref 30.0–36.0)
MCV: 85.9 fL (ref 80.0–100.0)
Platelets: 195 10*3/uL (ref 150–400)
RBC: 5.19 MIL/uL (ref 4.22–5.81)
RDW: 12.8 % (ref 11.5–15.5)
WBC: 6.3 10*3/uL (ref 4.0–10.5)
nRBC: 0 % (ref 0.0–0.2)

## 2020-10-31 LAB — HEMOGLOBIN A1C
Hgb A1c MFr Bld: 7.3 % — ABNORMAL HIGH (ref 4.8–5.6)
Mean Plasma Glucose: 162.81 mg/dL

## 2020-10-31 LAB — GLUCOSE, CAPILLARY: Glucose-Capillary: 123 mg/dL — ABNORMAL HIGH (ref 70–99)

## 2020-10-31 SURGERY — REPAIR, HERNIA, INGUINAL, ADULT
Anesthesia: General | Laterality: Right

## 2020-10-31 MED ORDER — OXYCODONE HCL 5 MG PO TABS
ORAL_TABLET | ORAL | Status: AC
  Filled 2020-10-31: qty 1

## 2020-10-31 MED ORDER — KETAMINE HCL 10 MG/ML IJ SOLN
INTRAMUSCULAR | Status: AC
  Filled 2020-10-31: qty 1

## 2020-10-31 MED ORDER — OXYCODONE HCL 5 MG PO TABS
5.0000 mg | ORAL_TABLET | Freq: Once | ORAL | Status: AC | PRN
  Administered 2020-10-31: 5 mg via ORAL

## 2020-10-31 MED ORDER — ONDANSETRON HCL 4 MG/2ML IJ SOLN
INTRAMUSCULAR | Status: AC
  Filled 2020-10-31: qty 2

## 2020-10-31 MED ORDER — ONDANSETRON HCL 4 MG/2ML IJ SOLN
INTRAMUSCULAR | Status: DC | PRN
  Administered 2020-10-31: 4 mg via INTRAVENOUS

## 2020-10-31 MED ORDER — BUPIVACAINE HCL 0.5 % IJ SOLN
INTRAMUSCULAR | Status: DC | PRN
  Administered 2020-10-31: 20 mL

## 2020-10-31 MED ORDER — MIDAZOLAM HCL 5 MG/5ML IJ SOLN
INTRAMUSCULAR | Status: DC | PRN
  Administered 2020-10-31 (×2): 1 mg via INTRAVENOUS

## 2020-10-31 MED ORDER — CHLORHEXIDINE GLUCONATE 0.12 % MT SOLN
15.0000 mL | Freq: Once | OROMUCOSAL | Status: AC
  Administered 2020-10-31: 15 mL via OROMUCOSAL

## 2020-10-31 MED ORDER — FENTANYL CITRATE (PF) 100 MCG/2ML IJ SOLN
INTRAMUSCULAR | Status: AC
  Filled 2020-10-31: qty 2

## 2020-10-31 MED ORDER — LIDOCAINE 2% (20 MG/ML) 5 ML SYRINGE
INTRAMUSCULAR | Status: DC | PRN
  Administered 2020-10-31: 60 mg via INTRAVENOUS

## 2020-10-31 MED ORDER — HYDROMORPHONE HCL 1 MG/ML IJ SOLN
0.2500 mg | INTRAMUSCULAR | Status: DC | PRN
Start: 2020-10-31 — End: 2020-11-01

## 2020-10-31 MED ORDER — OXYCODONE HCL 5 MG/5ML PO SOLN: 5.0000 mg | Freq: Once | ORAL | Status: AC | PRN

## 2020-10-31 MED ORDER — CEFAZOLIN SODIUM-DEXTROSE 2-4 GM/100ML-% IV SOLN
INTRAVENOUS | Status: AC
  Filled 2020-10-31: qty 100

## 2020-10-31 MED ORDER — PROPOFOL 10 MG/ML IV BOLUS
INTRAVENOUS | Status: DC | PRN
  Administered 2020-10-31: 150 mg via INTRAVENOUS

## 2020-10-31 MED ORDER — DEXAMETHASONE SODIUM PHOSPHATE 10 MG/ML IJ SOLN
INTRAMUSCULAR | Status: DC | PRN
  Administered 2020-10-31: 10 mg via INTRAVENOUS

## 2020-10-31 MED ORDER — ONDANSETRON HCL 4 MG/2ML IJ SOLN: 4.0000 mg | Freq: Once | INTRAMUSCULAR | Status: DC | PRN

## 2020-10-31 MED ORDER — BUPIVACAINE-EPINEPHRINE (PF) 0.5% -1:200000 IJ SOLN
INTRAMUSCULAR | Status: AC
  Filled 2020-10-31: qty 30

## 2020-10-31 MED ORDER — BUPIVACAINE LIPOSOME 1.3 % IJ SUSP
20.0000 mL | Freq: Once | INTRAMUSCULAR | Status: DC
  Filled 2020-10-31: qty 20

## 2020-10-31 MED ORDER — PROPOFOL 10 MG/ML IV BOLUS
INTRAVENOUS | Status: AC
  Filled 2020-10-31: qty 20

## 2020-10-31 MED ORDER — FENTANYL CITRATE (PF) 100 MCG/2ML IJ SOLN
INTRAMUSCULAR | Status: DC | PRN
  Administered 2020-10-31: 50 ug via INTRAVENOUS
  Administered 2020-10-31 (×2): 25 ug via INTRAVENOUS

## 2020-10-31 MED ORDER — ACETAMINOPHEN 500 MG PO TABS
1000.0000 mg | ORAL_TABLET | Freq: Once | ORAL | Status: AC
  Administered 2020-10-31: 1000 mg via ORAL
  Filled 2020-10-31: qty 2

## 2020-10-31 MED ORDER — LIDOCAINE 2% (20 MG/ML) 5 ML SYRINGE
INTRAMUSCULAR | Status: AC
  Filled 2020-10-31: qty 5

## 2020-10-31 MED ORDER — KETAMINE HCL 10 MG/ML IJ SOLN
INTRAMUSCULAR | Status: DC | PRN
  Administered 2020-10-31: 30 mg via INTRAVENOUS

## 2020-10-31 MED ORDER — CEFAZOLIN SODIUM-DEXTROSE 2-4 GM/100ML-% IV SOLN
2.0000 g | Freq: Once | INTRAVENOUS | Status: AC
  Administered 2020-10-31: 2 g via INTRAVENOUS

## 2020-10-31 MED ORDER — ORAL CARE MOUTH RINSE: 15.0000 mL | Freq: Once | OROMUCOSAL | Status: AC

## 2020-10-31 MED ORDER — BUPIVACAINE LIPOSOME 1.3 % IJ SUSP
INTRAMUSCULAR | Status: DC | PRN
  Administered 2020-10-31: 20 mL

## 2020-10-31 MED ORDER — DEXAMETHASONE SODIUM PHOSPHATE 10 MG/ML IJ SOLN
INTRAMUSCULAR | Status: AC
  Filled 2020-10-31: qty 1

## 2020-10-31 MED ORDER — OXYCODONE HCL 5 MG PO TABS: 5.0000 mg | ORAL_TABLET | Freq: Four times a day (QID) | ORAL | 0 refills | Status: DC | PRN

## 2020-10-31 MED ORDER — 0.9 % SODIUM CHLORIDE (POUR BTL) OPTIME
TOPICAL | Status: DC | PRN
  Administered 2020-10-31: 1000 mL

## 2020-10-31 MED ORDER — LACTATED RINGERS IV SOLN: INTRAVENOUS | Status: DC

## 2020-10-31 MED ORDER — MIDAZOLAM HCL 2 MG/2ML IJ SOLN
INTRAMUSCULAR | Status: AC
  Filled 2020-10-31: qty 2

## 2020-10-31 SURGICAL SUPPLY — 47 items
BENZOIN TINCTURE PRP APPL 2/3 (GAUZE/BANDAGES/DRESSINGS) IMPLANT
BLADE SURG 15 STRL LF DISP TIS (BLADE) ×1 IMPLANT
BLADE SURG 15 STRL SS (BLADE) ×2
CELLS DAT CNTRL 66122 CELL SVR (MISCELLANEOUS) IMPLANT
CHLORAPREP W/TINT 26 (MISCELLANEOUS) ×2 IMPLANT
COVER SURGICAL LIGHT HANDLE (MISCELLANEOUS) ×2 IMPLANT
COVER WAND RF STERILE (DRAPES) ×2 IMPLANT
DECANTER SPIKE VIAL GLASS SM (MISCELLANEOUS) ×2 IMPLANT
DERMABOND ADVANCED (GAUZE/BANDAGES/DRESSINGS) ×1
DERMABOND ADVANCED .7 DNX12 (GAUZE/BANDAGES/DRESSINGS) ×1 IMPLANT
DRAIN PENROSE 0.5X18 (DRAIN) ×2 IMPLANT
DRAPE LAPAROTOMY TRNSV 102X78 (DRAPES) ×2 IMPLANT
DRAPE UTILITY 15X26 TOWEL STRL (DRAPES) ×2 IMPLANT
DRSG TEGADERM 4X4.75 (GAUZE/BANDAGES/DRESSINGS) IMPLANT
DRSG TELFA PLUS 4X6 ADH ISLAND (GAUZE/BANDAGES/DRESSINGS) IMPLANT
ELECT REM PT RETURN 15FT ADLT (MISCELLANEOUS) ×2 IMPLANT
GAUZE SPONGE 4X4 12PLY STRL (GAUZE/BANDAGES/DRESSINGS) IMPLANT
GLOVE SURG POLYISO LF SZ7 (GLOVE) ×2 IMPLANT
GLOVE SURG UNDER POLY LF SZ7 (GLOVE) IMPLANT
GOWN STRL REUS W/TWL LRG LVL3 (GOWN DISPOSABLE) ×2 IMPLANT
GOWN STRL REUS W/TWL XL LVL3 (GOWN DISPOSABLE) ×2 IMPLANT
KIT BASIN OR (CUSTOM PROCEDURE TRAY) ×2 IMPLANT
KIT TURNOVER KIT A (KITS) ×2 IMPLANT
MESH HERNIA 3X6 (Mesh General) ×2 IMPLANT
NEEDLE HYPO 22GX1.5 SAFETY (NEEDLE) ×2 IMPLANT
PACK BASIC VI WITH GOWN DISP (CUSTOM PROCEDURE TRAY) ×2 IMPLANT
PENCIL SMOKE EVACUATOR (MISCELLANEOUS) IMPLANT
RETRACTOR WND ALEXIS 25 LRG (MISCELLANEOUS) IMPLANT
RTRCTR WOUND ALEXIS 18CM MED (MISCELLANEOUS)
RTRCTR WOUND ALEXIS 25CM LRG (MISCELLANEOUS)
SPONGE LAP 18X18 RF (DISPOSABLE) ×2 IMPLANT
SPONGE LAP 4X18 RFD (DISPOSABLE) ×2 IMPLANT
STRIP CLOSURE SKIN 1/2X4 (GAUZE/BANDAGES/DRESSINGS) IMPLANT
SUT MNCRL AB 4-0 PS2 18 (SUTURE) ×2 IMPLANT
SUT PDS AB 2-0 CT2 27 (SUTURE) ×2 IMPLANT
SUT PROLENE 2 0 CT2 30 (SUTURE) ×4 IMPLANT
SUT VIC AB 2-0 CT1 27 (SUTURE) ×1
SUT VIC AB 2-0 CT1 TAPERPNT 27 (SUTURE) ×1 IMPLANT
SUT VIC AB 3-0 SH 18 (SUTURE) ×2 IMPLANT
SUT VIC AB 3-0 SH 27 (SUTURE) ×4
SUT VIC AB 3-0 SH 27XBRD (SUTURE) ×2 IMPLANT
SYR BULB IRRIG 60ML STRL (SYRINGE) ×2 IMPLANT
SYR CONTROL 10ML LL (SYRINGE) ×2 IMPLANT
TOWEL OR 17X26 10 PK STRL BLUE (TOWEL DISPOSABLE) ×2 IMPLANT
TOWEL OR NON WOVEN STRL DISP B (DISPOSABLE) ×2 IMPLANT
TRAY CATH INTERMITTENT SS 16FR (CATHETERS) ×2 IMPLANT
YANKAUER SUCT BULB TIP 10FT TU (MISCELLANEOUS) ×2 IMPLANT

## 2020-10-31 NOTE — Anesthesia Postprocedure Evaluation (Signed)
Anesthesia Post Note  Patient: Jacob Hensley  Procedure(s) Performed: RIGHT OPEN INGUINAL HERNIA REPAIR WITH MESH (Right )     Patient location during evaluation: PACU Anesthesia Type: General Level of consciousness: awake and alert Pain management: pain level controlled Vital Signs Assessment: post-procedure vital signs reviewed and stable Respiratory status: spontaneous breathing, nonlabored ventilation and respiratory function stable Cardiovascular status: blood pressure returned to baseline and stable Postop Assessment: no apparent nausea or vomiting Anesthetic complications: no   No complications documented.  Last Vitals:  Vitals:   10/31/20 1751 10/31/20 1845  BP: (!) 155/76 (!) 151/72  Pulse: (!) 48 60  Resp: 20 18  Temp: 36.7 C 36.7 C  SpO2: 99% 99%    Last Pain:  Vitals:   10/31/20 1845  TempSrc:   PainSc: 0-No pain                 Lidia Collum

## 2020-10-31 NOTE — Op Note (Signed)
Preop diagnosis: right inguinal hernia  Postop diagnosis: right inguinal hernia  Procedure: open Right inguinal hernia repair with mesh  Surgeon: Gurney Maxin, M.D.  Asst: none  Anesthesia: Gen.   Indications for procedure: Jacob Hensley is a 75 y.o. male with symptoms of pain and enlarging Right inguinal hernia(s). After discussing risks, alternatives and benefits he decided on open repair and was brought to day surgery for repair.  Description of procedure: The patient was brought into the operative suite, placed supine. Anesthesia was administered with endotracheal tube. Patient was strapped in place. The patient was prepped and draped in the usual sterile fashion.  The anterior superior iliac spine and pubic tubercle were identified on the Right side. An incision was made 1cm above the connecting line, representative of the location of the inguinal ligament. The subcutaneous tissue was bluntly dissected, scarpa's fascia was dissected away. The external abdominal oblique fascia was identified and sharply opened down to the external inguinal ring. The conjoint tendon and inguinal ligament were identified. The cord structures and sac were dissected free of the surrounding tissue in 360 degrees. A penrose drain was used to encircle the contents. The cremasteric fibers were dissected free of the contents of the cord and hernia sac. The cord structures (vessels and vas deferens) were identified and carefully dissected away from the hernia sac. The hernia sac was reduced and contained no visceral structures.The hernia sac was dissected down to the internal inguinal ring. Preperitoneal fat was identified showing appropriate dissection. The sac was then reduced into the preperitoneal space. The hernia was indirect. The floor was weak and 2-0 PDS interrupted sutures were placed to appose the conjoint tendon to the shelving edge. A 3x6 Bard mesh was then used to close the defect and reinforce the  floor. The mesh was sutured to the lacunar ligament and inguinal ligament using a 2-0 prolene in running fashion. Next the superior edge of the mesh was sutured to the conjoined tendon using a 2-0 running Prolene. An additional 2-0 Prolene was used to suture the tail ends of the mesh together re-creating the deep ring. Cord structures are running in a neutral position through the mesh. Next the external abdominal oblique fascia was closed with a 2-0 Vicryl in interrupted fashion to re-create the external inguinal ring. Scarpa's fascia was closed with 3-0 Vicryl in running fashion. Skin was closed with a 4-0 Monocryl subcuticular stitch in running fashion. Dermabond place for dressing. Patient woke from anesthesia and brought to PACU in stable condition. All counts are correct.    Findings: right inguinal hernia  Specimen: none  Blood loss: 10 ml  Local anesthesia: 50 ml Marcaine/Exparel mix  Complications: none  Implant: 8 x 15 bard mesh  Gurney Maxin, M.D. General, Bariatric, & Minimally Invasive Surgery [facility], [place] 3:59 PM 10/31/2020

## 2020-10-31 NOTE — Anesthesia Preprocedure Evaluation (Addendum)
Anesthesia Evaluation  Patient identified by MRN, date of birth, ID band Patient awake    Reviewed: Allergy & Precautions, NPO status , Patient's Chart, lab work & pertinent test results, reviewed documented beta blocker date and time   Airway Mallampati: II  TM Distance: >3 FB Neck ROM: Full    Dental no notable dental hx. (+) Teeth Intact, Dental Advisory Given   Pulmonary neg pulmonary ROS,    Pulmonary exam normal breath sounds clear to auscultation       Cardiovascular hypertension, Pt. on medications and Pt. on home beta blockers + CAD, + CABG (CABG 2003), +CHF (grade 1 diastolic dysfunction) and + DVT (on Eliquis, reports last dose 10/27/20)  Normal cardiovascular exam+ Valvular Problems/Murmurs (mild AI) AI  Rhythm:Regular Rate:Normal  Stress test 2021: Excellent exercise capacity, identical to last year and only one minute less than 2 and 3 years ago. Similar "false positive" ST depression has been seen on multiple previous stress tests, ever since CABG and is not indicative of new coronary blockages.  Echo 2018: - Left ventricle: The cavity size was normal. Wall thickness was  increased in a pattern of mild LVH. Systolic function was normal.  The estimated ejection fraction was in the range of 60% to 65%.  Doppler parameters are consistent with abnormal left ventricular  relaxation (grade 1 diastolic dysfunction).  - Aortic valve: There was mild regurgitation.    Neuro/Psych CVA (2018) negative psych ROS   GI/Hepatic negative GI ROS, Neg liver ROS,   Endo/Other  diabetes, Well Controlled, Type 2, Oral Hypoglycemic Agents  Renal/GU negative Renal ROS Bladder dysfunction (urinary retention, has been doing in and out catheterizations for 3 years )      Musculoskeletal negative musculoskeletal ROS (+)   Abdominal   Peds  Hematology negative hematology ROS (+)   Anesthesia Other Findings Right inguinal  hernia   Reproductive/Obstetrics negative OB ROS                           Anesthesia Physical Anesthesia Plan  ASA: II  Anesthesia Plan: General   Post-op Pain Management:    Induction: Intravenous  PONV Risk Score and Plan: 3 and Ondansetron, Dexamethasone and Treatment may vary due to age or medical condition  Airway Management Planned: Oral ETT  Additional Equipment: None  Intra-op Plan:   Post-operative Plan: Extubation in OR  Informed Consent: I have reviewed the patients History and Physical, chart, labs and discussed the procedure including the risks, benefits and alternatives for the proposed anesthesia with the patient or authorized representative who has indicated his/her understanding and acceptance.     Dental advisory given  Plan Discussed with: CRNA  Anesthesia Plan Comments:        Anesthesia Quick Evaluation

## 2020-10-31 NOTE — Transfer of Care (Signed)
Immediate Anesthesia Transfer of Care Note  Patient: Jacob Hensley  Procedure(s) Performed: Procedure(s) with comments: RIGHT OPEN INGUINAL HERNIA REPAIR WITH MESH (Right) - 60/RM5  Patient Location: PACU  Anesthesia Type:General  Level of Consciousness: Alert, Awake, Oriented  Airway & Oxygen Therapy: Patient Spontanous Breathing  Post-op Assessment: Report given to RN  Post vital signs: Reviewed and stable  Last Vitals:  Vitals:   10/31/20 1256  BP: (!) 174/97  Pulse: 62  Resp: 16  Temp: 36.5 C  SpO2: 321%    Complications: No apparent anesthesia complications

## 2020-10-31 NOTE — Discharge Instructions (Signed)
[facility] _______[facility]  UMBILICAL OR INGUINAL HERNIA REPAIR: POST OP INSTRUCTIONS  Always review your discharge instruction sheet given to you by the facility where your surgery was performed. IF YOU HAVE DISABILITY OR FAMILY LEAVE FORMS, YOU MUST BRING THEM TO THE OFFICE FOR PROCESSING.   DO NOT GIVE THEM TO YOUR DOCTOR.  1. A  prescription for pain medication may be given to you upon discharge.  Take your pain medication as prescribed, if needed.  If narcotic pain medicine is not needed, then you may take acetaminophen (Tylenol) or ibuprofen (Advil) as needed. 2. Take your usually prescribed medications unless otherwise directed. If you need a refill on your pain medication, please contact your pharmacy.  They will contact our office to request authorization. Prescriptions will not be filled after 5 pm or on week-ends. 3. You should follow a light diet the first 24 hours after arrival home, such as soup and crackers, etc.  Be sure to include lots of fluids daily.  Resume your normal diet the day after surgery. 4.Most patients will experience some swelling and bruising around the umbilicus or in the groin and scrotum.  Ice packs and reclining will help.  Swelling and bruising can take several days to resolve.  6. It is common to experience some constipation if taking pain medication after surgery.  Increasing fluid intake and taking a stool softener (such as Colace) will usually help or prevent this problem from occurring.  A mild laxative (Milk of Magnesia or Miralax) should be taken according to package directions if there are no bowel movements after 48 hours. 7. Unless discharge instructions indicate otherwise, you may remove your bandages 24-48 hours after surgery, and you may shower at that time.  You may have steri-strips (small skin tapes) in place directly over the incision.  These strips should be left on the skin for 7-10 days.  If your surgeon used skin glue on the  incision, you may shower in 24 hours.  The glue will flake off over the next 2-3 weeks.  Any sutures or staples will be removed at the office during your follow-up visit. 8. ACTIVITIES:  You may resume regular (light) daily activities beginning the next day--such as daily self-care, walking, climbing stairs--gradually increasing activities as tolerated.  You may have sexual intercourse when it is comfortable.  Refrain from any heavy lifting or straining until approved by your doctor.  a.You may drive when you are no longer taking prescription pain medication, you can comfortably wear a seatbelt, and you can safely maneuver your car and apply brakes. b.RETURN TO WORK:   _____________________________________________  9.You should see your doctor in the office for a follow-up appointment approximately 2-3 weeks after your surgery.  Make sure that you call for this appointment within a day or two after you arrive home to insure a convenient appointment time. 10.OTHER INSTRUCTIONS: _________________________    _____________________________________  WHEN TO CALL YOUR DOCTOR: 1. Fever over 101.0 2. Inability to urinate 3. Nausea and/or vomiting 4. Extreme swelling or bruising 5. Continued bleeding from incision. 6. Increased pain, redness, or drainage from the incision  The clinic staff is available to answer your questions during regular business hours.  Please don't hesitate to call and ask to speak to one of the nurses for clinical concerns.  If you have a medical emergency, go to the nearest emergency room or call 911.  A surgeon from [facility] is always on call at the hospital     [address], [place], [place], [place]  [address] ?  [address], [place], [place]   [address] [phone] ? [phone] ? FAX [phone] Web site: [email or web address]   General Anesthesia, Adult, Care After This sheet gives you information about how to care for yourself  after your procedure. Your health care provider may also give you more specific instructions. If you have problems or questions, contact your health care provider. What can I expect after the procedure? After the procedure, the following side effects are common:  Pain or discomfort at the IV site.  Nausea.  Vomiting.  Sore throat.  Trouble concentrating.  Feeling cold or chills.  Feeling weak or tired.  Sleepiness and fatigue.  Soreness and body aches. These side effects can affect parts of the body that were not involved in surgery. Follow these instructions at home: For the time period you were told by your health care provider:  Rest.  Do not participate in activities where you could fall or become injured.  Do not drive or use machinery.  Do not drink alcohol.  Do not take sleeping pills or medicines that cause drowsiness.  Do not make important decisions or sign legal documents.  Do not take care of children on your own.   Eating and drinking  Follow any instructions from your health care provider about eating or drinking restrictions.  When you feel hungry, start by eating small amounts of foods that are soft and easy to digest (bland), such as toast. Gradually return to your regular diet.  Drink enough fluid to keep your urine pale yellow.  If you vomit, rehydrate by drinking water, juice, or clear broth. General instructions  If you have sleep apnea, surgery and certain medicines can increase your risk for breathing problems. Follow instructions from your health care provider about wearing your sleep device: ? Anytime you are sleeping, including during daytime naps. ? While taking prescription pain medicines, sleeping medicines, or medicines that make you drowsy.  Have a responsible adult stay with you for the time you are told. It is important to have someone help care for you until you are awake and alert.  Return to your normal activities as told by  your health care provider. Ask your health care provider what activities are safe for you.  Take over-the-counter and prescription medicines only as told by your health care provider.  If you smoke, do not smoke without supervision.  Keep all follow-up visits as told by your health care provider. This is important. Contact a health care provider if:  You have nausea or vomiting that does not get better with medicine.  You cannot eat or drink without vomiting.  You have pain that does not get better with medicine.  You are unable to pass urine.  You develop a skin rash.  You have a fever.  You have redness around your IV site that gets worse. Get help right away if:  You have difficulty breathing.  You have chest pain.  You have blood in your urine or stool, or you vomit blood. Summary  After the procedure, it is common to have a sore throat or nausea. It is also common to feel tired.  Have a responsible adult stay with you for the time you are told. It is important to have someone help care for you until you are awake and alert.  When you feel hungry, start by eating small amounts of foods that are soft and easy to  digest (bland), such as toast. Gradually return to your regular diet.  Drink enough fluid to keep your urine pale yellow.  Return to your normal activities as told by your health care provider. Ask your health care provider what activities are safe for you. This information is not intended to replace advice given to you by your health care provider. Make sure you discuss any questions you have with your health care provider. Document Revised: 03/30/2020 Document Reviewed: 10/28/2019 Elsevier Patient Education  2021 [employer].

## 2020-10-31 NOTE — Anesthesia Procedure Notes (Signed)
Procedure Name: LMA Insertion Date/Time: 10/31/2020 2:59 PM Performed by: Gerald Leitz, CRNA Pre-anesthesia Checklist: Patient identified, Patient being monitored, Timeout performed, Emergency Drugs available and Suction available Patient Re-evaluated:Patient Re-evaluated prior to induction Oxygen Delivery Method: Circle system utilized Preoxygenation: Pre-oxygenation with 100% oxygen Induction Type: IV induction Ventilation: Mask ventilation without difficulty LMA: LMA inserted LMA Size: 4.0 Tube type: Oral Number of attempts: 1 Placement Confirmation: positive ETCO2 and breath sounds checked- equal and bilateral Tube secured with: Tape Dental Injury: Teeth and Oropharynx as per pre-operative assessment

## 2020-10-31 NOTE — H&P (Signed)
Jacob Hensley is an 75 y.o. male.   Chief Complaint: hernia HPI: 75 yo male with symptomatic right inguinal hernia. He presents for surgery.  Past Medical History:  Diagnosis Date  . BPH (benign prostatic hyperplasia)   . Coronary artery disease    CABG 2003-DR. CROITORU IS PT'S CARDIOLOGIST - LAST OFFICE VISIT 08/02/11-PT EXERCISES DAILY-NO C/O OF CHEST PAIN  . Diabetes mellitus    BORDERLINE - PT CHECKS HIS BLOOD SUGARS AT HOME - NO MEDS  . DVT of leg (deep venous thrombosis) ([place]) 2003   S/P CABG SURGERY  . Hyperlipidemia   . Hypertension   . Melanoma ([place])    Back  . Stroke ([place])   . Urine retention    PT STATES HE HAS BEEN DOING I&O CATHS FOR PAST 3 YRS    Past Surgical History:  Procedure Laterality Date  . CATARACT EXTRACTION W/ INTRAOCULAR LENS  IMPLANT, BILATERAL    . COLONOSCOPY     Dr. Verdia Kuba  . CORONARY ARTERY BYPASS GRAFT  02/05/2002   LIMA to LAD,RIMA to PDA,SVG to diagonal & free left radial arter to the oblique marginal artery - Dr. Cyndia Bent  . CPET w/PFT  1//20/2014   Normal  . CYSTOSCOPY WITH BIOPSY  11/08/2011   Procedure: CYSTOSCOPY WITH BIOPSY;  Surgeon: Fredricka Bonine, MD;  Location: [facility];  Service: Urology;;  Bladder biopsies  . CYSTOSCOPY WITH BIOPSY  06/19/2012   Procedure: CYSTOSCOPY WITH BIOPSY;  Surgeon: Fredricka Bonine, MD;  Location: [facility];  Service: Urology;  Laterality: N/A;  fulgaration of bleeders  . MELANOMA EXCISION  2012  . TONSILLECTOMY    . TRANSURETHRAL RESECTION OF PROSTATE  11-08-2011  . US ECHOCARDIOGRAPHY  04/14/2009   EF >50%,borderline dilated RV w/normal systolic fx,mildly dilated LA,mild AI,MR,TR.    Family History  Problem Relation Age of Onset  . Cancer Mother        Breast  . Diabetes Father   . Heart failure Father    Social History:  reports that he has never smoked. He has never used smokeless tobacco. He reports that he does not drink alcohol and does not use drugs.  Allergies: No Known  Allergies  Medications Prior to Admission  Medication Sig Dispense Refill  . Ascorbic Acid (VITAMIN C) 1000 MG tablet Take 1,000 mg by mouth daily.    . hydrochlorothiazide (HYDRODIURIL) 25 MG tablet Take 25 mg by mouth daily.    . metFORMIN (GLUCOPHAGE) 500 MG tablet Take by mouth daily with breakfast.    . metoprolol succinate (TOPROL-XL) 25 MG 24 hr tablet Take 1 tablet (25 mg total) by mouth daily before breakfast. 90 tablet 3  . Multiple Vitamins-Minerals (MULTIVITAMIN WITH MINERALS) tablet Take 1 tablet by mouth daily.    . ramipril (ALTACE) 10 MG capsule Take 10 mg by mouth at bedtime.    . rosuvastatin (CRESTOR) 40 MG tablet Take 1 tablet (40 mg total) by mouth at bedtime. 90 tablet 3  . apixaban (ELIQUIS) 5 MG TABS tablet Take 1 tablet (5 mg total) by mouth 2 (two) times daily. 180 tablet 0    Results for orders placed or performed during the hospital encounter of 10/31/20 (from the past 48 hour(s))  Basic metabolic panel per protocol     Status: Abnormal   Collection Time: 10/31/20  1:00 PM  Result Value Ref Range   Sodium 137 135 - 145 mmol/L   Potassium 4.1 3.5 - 5.1 mmol/L   Chloride 106  98 - 111 mmol/L   CO2 24 22 - 32 mmol/L   Glucose, Bld 114 (H) 70 - 99 mg/dL    Comment: Glucose reference range applies only to samples taken after fasting for at least 8 hours.   BUN 18 8 - 23 mg/dL   Creatinine, Ser 0.73 0.61 - 1.24 mg/dL   Calcium 9.4 8.9 - 10.3 mg/dL   GFR, Estimated >60 >60 mL/min    Comment: (NOTE) Calculated using the CKD-EPI Creatinine Equation (2021)    Anion gap 7 5 - 15    Comment: Performed at [facility], [place] [address], [place], [place] [address]  CBC per protocol     Status: None   Collection Time: 10/31/20  1:00 PM  Result Value Ref Range   WBC 6.3 4.0 - 10.5 K/uL   RBC 5.19 4.22 - 5.81 MIL/uL   Hemoglobin 15.7 13.0 - 17.0 g/dL   HCT 44.6 39.0 - 52.0 %   MCV 85.9 80.0 - 100.0 fL   MCH 30.3 26.0 - 34.0 pg   MCHC 35.2 30.0 -  36.0 g/dL   RDW 12.8 11.5 - 15.5 %   Platelets 195 150 - 400 K/uL   nRBC 0.0 0.0 - 0.2 %    Comment: Performed at [facility], [place] [address], [place], [place] [address]   No results found.  Review of Systems  Constitutional: Negative for chills and fever.  HENT: Negative for hearing loss.   Respiratory: Negative for cough.   Cardiovascular: Negative for chest pain and palpitations.  Gastrointestinal: Negative for abdominal pain, nausea and vomiting.  Genitourinary: Negative for dysuria and urgency.  Musculoskeletal: Negative for myalgias and neck pain.  Skin: Negative for rash.  Neurological: Negative for dizziness and headaches.  Hematological: Does not bruise/bleed easily.  Psychiatric/Behavioral: Negative for suicidal ideas.    Blood pressure (!) 174/97, pulse 62, temperature 97.7 F (36.5 C), temperature source Oral, resp. rate 16, height 5\' 9"  (1.753 m), weight 77.5 kg, SpO2 100 %. Physical Exam Vitals reviewed.  Constitutional:      Appearance: He is well-developed.  HENT:     Head: Normocephalic and atraumatic.  Eyes:     Conjunctiva/sclera: Conjunctivae normal.     Pupils: Pupils are equal, round, and reactive to light.  Cardiovascular:     Rate and Rhythm: Normal rate and regular rhythm.  Pulmonary:     Effort: Pulmonary effort is normal.     Breath sounds: Normal breath sounds.  Abdominal:     General: Bowel sounds are normal. There is no distension.     Palpations: Abdomen is soft.     Tenderness: There is no abdominal tenderness.     Comments: Right inguinal hernia  Musculoskeletal:        General: Normal range of motion.     Cervical back: Normal range of motion and neck supple.  Skin:    General: Skin is warm and dry.  Neurological:     Mental Status: He is alert and oriented to person, place, and time.  Psychiatric:        Behavior: Behavior normal.    Assessment/Plan 75 yo male with symptomatic right inguinal hernia -open  right inguinal hernia repair with mesh  Mickeal Skinner, MD 10/31/2020, 2:41 PM

## 2020-11-06 ENCOUNTER — Encounter (HOSPITAL_COMMUNITY): Payer: Self-pay | Admitting: General Surgery

## 2020-12-07 ENCOUNTER — Other Ambulatory Visit: Payer: Self-pay | Admitting: Internal Medicine

## 2020-12-07 DIAGNOSIS — E7800 Pure hypercholesterolemia, unspecified: Secondary | ICD-10-CM

## 2021-01-01 ENCOUNTER — Ambulatory Visit
Admission: RE | Admit: 2021-01-01 | Discharge: 2021-01-01 | Disposition: A | Discharge status: Home / Self Care | Payer: No Typology Code available for payment source | Source: Ambulatory Visit | Attending: Internal Medicine | Admitting: Internal Medicine

## 2021-01-01 DIAGNOSIS — E7800 Pure hypercholesterolemia, unspecified: Secondary | ICD-10-CM | POA: Diagnosis not present

## 2021-01-01 DIAGNOSIS — E785 Hyperlipidemia, unspecified: Secondary | ICD-10-CM | POA: Diagnosis not present

## 2021-01-09 DIAGNOSIS — E785 Hyperlipidemia, unspecified: Secondary | ICD-10-CM | POA: Diagnosis not present

## 2021-01-09 DIAGNOSIS — Z7901 Long term (current) use of anticoagulants: Secondary | ICD-10-CM | POA: Diagnosis not present

## 2021-01-09 DIAGNOSIS — I129 Hypertensive chronic kidney disease with stage 1 through stage 4 chronic kidney disease, or unspecified chronic kidney disease: Secondary | ICD-10-CM | POA: Diagnosis not present

## 2021-01-09 DIAGNOSIS — E1122 Type 2 diabetes mellitus with diabetic chronic kidney disease: Secondary | ICD-10-CM | POA: Diagnosis not present

## 2021-01-11 DIAGNOSIS — Z7901 Long term (current) use of anticoagulants: Secondary | ICD-10-CM | POA: Diagnosis not present

## 2021-01-11 DIAGNOSIS — E1122 Type 2 diabetes mellitus with diabetic chronic kidney disease: Secondary | ICD-10-CM | POA: Diagnosis not present

## 2021-01-11 DIAGNOSIS — Z86718 Personal history of other venous thrombosis and embolism: Secondary | ICD-10-CM | POA: Diagnosis not present

## 2021-01-11 DIAGNOSIS — E785 Hyperlipidemia, unspecified: Secondary | ICD-10-CM | POA: Diagnosis not present

## 2021-01-11 DIAGNOSIS — I2510 Atherosclerotic heart disease of native coronary artery without angina pectoris: Secondary | ICD-10-CM | POA: Diagnosis not present

## 2021-01-11 DIAGNOSIS — I129 Hypertensive chronic kidney disease with stage 1 through stage 4 chronic kidney disease, or unspecified chronic kidney disease: Secondary | ICD-10-CM | POA: Diagnosis not present

## 2021-01-31 NOTE — Progress Notes (Signed)
Cardiology Office Note    Date:  02/03/2021   ID:  Jacob Hensley, DOB June 15, 1946, MRN [id number]  PCP:  Deland Pretty, MD  Cardiologist:  Sanda Klein, MD  Electrophysiologist:  None   Evaluation Performed:  Follow-Up Visit  Chief Complaint:  CAD s/p CABG  History of Present Illness:    Jacob Hensley is a 75 y.o. male with CAD s/p CABG 2003, ), diet-controlled diabetes mellitus, hypercholesterolemia, essential hypertension, returning for follow-up.  The patient specifically denies any chest pain at rest exertion, dyspnea at rest or with exertion, orthopnea, paroxysmal nocturnal dyspnea, syncope, palpitations, focal neurological deficits, intermittent claudication, lower extremity edema, unexplained weight gain, cough, hemoptysis or wheezing. Continues to walk several miles daily. Has an app that tracks BP (120-140/60-70s), HR (45-60s at rest), glucose, steps, etc. Eats a healthy diet, but has a weakness for starchy food.  Last LDL a little high at 81, A1c 7.3% (now on metformin).  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  Last August he again was able to exercise for over 13 minutes on the Bruce protocol, same as  2020.  He is known to have a "false positive" ST segment response, which has been the case ever since his bypass surgery.  He did not have angina during exercise.  Past Medical History:  Diagnosis Date   BPH (benign prostatic hyperplasia)    Coronary artery disease    CABG 2003-DR. Uthman Mroczkowski IS PT'S CARDIOLOGIST - LAST OFFICE VISIT 08/02/11-PT EXERCISES DAILY-NO C/O OF CHEST PAIN   Diabetes mellitus    BORDERLINE - PT CHECKS HIS BLOOD SUGARS AT HOME - NO MEDS   DVT of leg (deep venous thrombosis) ([place]) 2003   S/P CABG SURGERY   Hyperlipidemia    Hypertension    Melanoma ([place])    Back   Stroke ([place])    Urine retention    PT STATES HE HAS BEEN DOING I&O CATHS FOR PAST 3 YRS   Past Surgical History:  Procedure  Laterality Date   CATARACT EXTRACTION W/ INTRAOCULAR LENS  IMPLANT, BILATERAL     COLONOSCOPY     Dr. Verdia Kuba   CORONARY ARTERY BYPASS GRAFT  02/05/2002   LIMA to LAD,RIMA to PDA,SVG to diagonal & free left radial arter to the oblique marginal artery - Dr. Cyndia Bent   CPET w/PFT  1//20/2014   Normal   CYSTOSCOPY WITH BIOPSY  11/08/2011   Procedure: CYSTOSCOPY WITH BIOPSY;  Surgeon: Fredricka Bonine, MD;  Location: [facility];  Service: Urology;;  Bladder biopsies   CYSTOSCOPY WITH BIOPSY  06/19/2012   Procedure: CYSTOSCOPY WITH BIOPSY;  Surgeon: Fredricka Bonine, MD;  Location: [facility];  Service: Urology;  Laterality: N/A;  fulgaration of bleeders   INGUINAL HERNIA REPAIR Right 10/31/2020   Procedure: RIGHT OPEN INGUINAL HERNIA REPAIR WITH MESH;  Surgeon: Kinsinger, Arta Bruce, MD;  Location: [facility];  Service: General;  Laterality: Right;  60/RM5   MELANOMA EXCISION  2012   TONSILLECTOMY     TRANSURETHRAL RESECTION OF PROSTATE  11-08-2011   US ECHOCARDIOGRAPHY  04/14/2009   EF >50%,borderline dilated RV w/normal systolic fx,mildly dilated LA,mild AI,MR,TR.     Current Meds  Medication Sig   apixaban (ELIQUIS) 5 MG TABS tablet Take 1 tablet (5 mg total) by mouth 2 (two) times daily.   Ascorbic Acid (VITAMIN C) 1000 MG tablet Take 1,000 mg by mouth daily.   ezetimibe (ZETIA) 10 MG  tablet Take 1 tablet (10 mg total) by mouth daily.   hydrochlorothiazide (HYDRODIURIL) 25 MG tablet Take 25 mg by mouth daily.   metFORMIN (GLUCOPHAGE) 500 MG tablet Take by mouth daily with breakfast.   metoprolol succinate (TOPROL-XL) 25 MG 24 hr tablet Take 1 tablet (25 mg total) by mouth daily before breakfast.   Multiple Vitamins-Minerals (MULTIVITAMIN WITH MINERALS) tablet Take 1 tablet by mouth daily.   ramipril (ALTACE) 10 MG capsule Take 10 mg by mouth at bedtime.   rosuvastatin (CRESTOR) 40 MG tablet Take 1 tablet (40 mg total) by mouth at bedtime.   [DISCONTINUED] oxyCODONE (OXY IR/ROXICODONE) 5 MG  immediate release tablet Take 1 tablet (5 mg total) by mouth every 6 (six) hours as needed for severe pain.     Allergies:   Patient has no known allergies.   Social History   Tobacco Use   Smoking status: Never   Smokeless tobacco: Never  Vaping Use   Vaping Use: Never used  Substance Use Topics   Alcohol use: No   Drug use: No     Family Hx: The patient's family history includes Cancer in his mother; Diabetes in his father; Heart failure in his father.  ROS:   Please see the history of present illness.    All other systems are reviewed and are negative.   Prior CV studies:   The following studies were reviewed today:  ECG stress test 2021  Labs/Other Tests and Data Reviewed:    EKG: Is ordered today shows sinus bradycardia, 1st degree AV block, occ PACs, nonspecific ST changes  Recent Labs: 10/31/2020: BUN 18; Creatinine, Ser 0.73; Hemoglobin 15.7; Platelets 195; Potassium 4.1; Sodium 137   Recent Lipid Panel Lab Results  Component Value Date/Time   CHOL 149 01/15/2019 12:48 PM   TRIG 102 01/15/2019 12:48 PM   HDL 54 01/15/2019 12:48 PM   CHOLHDL 2.8 01/15/2019 12:48 PM   CHOLHDL 3.6 01/27/2017 04:10 AM   LDLCALC 75 01/15/2019 12:48 PM    Wt Readings from Last 3 Encounters:  02/01/21 177 lb 3.2 oz (80.4 kg)  10/31/20 170 lb 12.8 oz (77.5 kg)  09/20/20 178 lb 3.2 oz (80.8 kg)     Objective:    Vital Signs:  BP 140/90 (BP Location: Right Arm)   Pulse (!) 55   Ht 5\' 9"  (1.753 m)   Wt 177 lb 3.2 oz (80.4 kg)   BMI 26.17 kg/m     General: Alert, oriented x3, no distress, appears well Head: no evidence of trauma, PERRL, EOMI, no exophtalmos or lid lag, no myxedema, no xanthelasma; normal ears, nose and oropharynx Neck: normal jugular venous pulsations and no hepatojugular reflux; brisk carotid pulses without delay and no carotid bruits Chest: clear to auscultation, no signs of consolidation by percussion or palpation, normal fremitus, symmetrical and full  respiratory excursions Cardiovascular: normal position and quality of the apical impulse, regular rhythm, normal first and second heart sounds, no murmurs, rubs or gallops Abdomen: no tenderness or distention, no masses by palpation, no abnormal pulsatility or arterial bruits, normal bowel sounds, no hepatosplenomegaly Extremities: no clubbing, cyanosis or edema; 2+ radial, ulnar and brachial pulses bilaterally; 2+ right femoral, posterior tibial and dorsalis pedis pulses; 2+ left femoral, posterior tibial and dorsalis pedis pulses; no subclavian or femoral bruits Neurological: grossly nonfocal Psych: Normal mood and affect    ASSESSMENT & PLAN:    CAD s/p CABG: Very active.  Asymptomatic.  He will always have a "false positive"  stress ECG.  However, the treadmill test is useful to compare his functional status over time.   He is on beta-blocker and statin in a high dose.  His LDL cholesterol is not perfect, but he is on max dose rosuvastatin.  He is not taking aspirin since he is on Eliquis.  HTN: Consistently well controlled although the diastolic blood pressure is occasionally a little high.  No changes in medications. HLP: His LDL cholesterol is very close to target and he is on the maximum usual dose of rosuvastatin. Add ezetimibe 10 mg daily. DM: Well-controlled without medicines. Recurrent DVT: Currently asymptomatic.  On lifelong Eliquis.  Hx of stroke: He has a patent foramen ovale and had a small pontine infarct in July 2018, felt to be paradoxical embolism since he also had a subacute right lower extremity DVT at that time.   Anticoagulation: Denies falls, injuries or bleeding complications.   Medication Adjustments/Labs and Tests Ordered: Current medicines are reviewed at length with the patient today.  Concerns regarding medicines are outlined above.  Patient Instructions  Medication Instructions:  START Zetia 10 mg once daily  *If you need a refill on your cardiac medications  before your next appointment, please call your pharmacy*   Lab Work: None ordered If you have labs (blood work) drawn today and your tests are completely normal, you will receive your results only by: [place] (if you have MyChart) OR A paper copy in the mail If you have any lab test that is abnormal or we need to change your treatment, we will call you to review the results.   Testing/Procedures: Your physician has requested that you have an exercise tolerance test. For further information please visit [email or web address]. Please also follow instruction sheet, as given. This will take place at [place], [address]. Do not drink or eat foods with caffeine for 24 hours before the test. (Chocolate, coffee, tea, or energy drinks) If you use an inhaler, bring it with you to the test. Do not smoke for 4 hours before the test. Wear comfortable shoes and clothing. Hold Metoprolol the morning of the test Hold the Hydrochlorothiazide the morning of the test  Follow-Up: At [facility], you and your health needs are our priority.  As part of our continuing mission to provide you with exceptional heart care, we have created designated Provider Care Teams.  These Care Teams include your primary Cardiologist (physician) and Advanced Practice Providers (APPs -  Physician Assistants and Nurse Practitioners) who all work together to provide you with the care you need, when you need it.  We recommend signing up for the patient portal called "MyChart".  Sign up information is provided on this After Visit Summary.  MyChart is used to connect with patients for Virtual Visits (Telemedicine).  Patients are able to view lab/test results, encounter notes, upcoming appointments, etc.  Non-urgent messages can be sent to your provider as well.   To learn more about what you can do with MyChart, go to [email or web address].    Your next appointment:   12 month(s)  The format for your next  appointment:   In Person  Provider:   You may see Sanda Klein, MD or one of the following Advanced Practice Providers on your designated Care Team:   Almyra Deforest, PA-C Fabian Sharp, [place] or  Roby Lofts, PA-C    Signed, Sanda Klein, MD  02/03/2021 7:41 PM    [place]

## 2021-02-01 ENCOUNTER — Encounter: Payer: Self-pay | Admitting: Cardiovascular Disease

## 2021-02-01 ENCOUNTER — Other Ambulatory Visit: Payer: Self-pay

## 2021-02-01 ENCOUNTER — Ambulatory Visit (INDEPENDENT_AMBULATORY_CARE_PROVIDER_SITE_OTHER): Payer: Medicare Other | Admitting: Cardiovascular Disease

## 2021-02-01 VITALS — BP 140/90 | HR 55 | Ht 69.0 in | Wt 177.2 lb

## 2021-02-01 DIAGNOSIS — I2510 Atherosclerotic heart disease of native coronary artery without angina pectoris: Secondary | ICD-10-CM | POA: Diagnosis not present

## 2021-02-01 DIAGNOSIS — Z7901 Long term (current) use of anticoagulants: Secondary | ICD-10-CM

## 2021-02-01 DIAGNOSIS — Z86718 Personal history of other venous thrombosis and embolism: Secondary | ICD-10-CM

## 2021-02-01 DIAGNOSIS — Z8673 Personal history of transient ischemic attack (TIA), and cerebral infarction without residual deficits: Secondary | ICD-10-CM | POA: Diagnosis not present

## 2021-02-01 DIAGNOSIS — E119 Type 2 diabetes mellitus without complications: Secondary | ICD-10-CM

## 2021-02-01 DIAGNOSIS — I10 Essential (primary) hypertension: Secondary | ICD-10-CM | POA: Diagnosis not present

## 2021-02-01 DIAGNOSIS — E7800 Pure hypercholesterolemia, unspecified: Secondary | ICD-10-CM

## 2021-02-01 MED ORDER — EZETIMIBE 10 MG PO TABS: 10.0000 mg | ORAL_TABLET | Freq: Every day | ORAL | 3 refills | Status: AC

## 2021-02-01 NOTE — Patient Instructions (Signed)
Medication Instructions:  START Zetia 10 mg once daily  *If you need a refill on your cardiac medications before your next appointment, please call your pharmacy*   Lab Work: None ordered If you have labs (blood work) drawn today and your tests are completely normal, you will receive your results only by: [place] (if you have MyChart) OR A paper copy in the mail If you have any lab test that is abnormal or we need to change your treatment, we will call you to review the results.   Testing/Procedures: Your physician has requested that you have an exercise tolerance test. For further information please visit [email or web address]. Please also follow instruction sheet, as given. This will take place at [place], [address]. Do not drink or eat foods with caffeine for 24 hours before the test. (Chocolate, coffee, tea, or energy drinks) If you use an inhaler, bring it with you to the test. Do not smoke for 4 hours before the test. Wear comfortable shoes and clothing. Hold Metoprolol the morning of the test Hold the Hydrochlorothiazide the morning of the test  Follow-Up: At [facility], you and your health needs are our priority.  As part of our continuing mission to provide you with exceptional heart care, we have created designated Provider Care Teams.  These Care Teams include your primary Cardiologist (physician) and Advanced Practice Providers (APPs -  Physician Assistants and Nurse Practitioners) who all work together to provide you with the care you need, when you need it.  We recommend signing up for the patient portal called "MyChart".  Sign up information is provided on this After Visit Summary.  MyChart is used to connect with patients for Virtual Visits (Telemedicine).  Patients are able to view lab/test results, encounter notes, upcoming appointments, etc.  Non-urgent messages can be sent to your provider as well.   To learn more about what you can do with MyChart,  go to [email or web address].    Your next appointment:   12 month(s)  The format for your next appointment:   In Person  Provider:   You may see Sanda Klein, MD or one of the following Advanced Practice Providers on your designated Care Team:   Almyra Deforest, PA-C Fabian Sharp, PA-C or  Roby Lofts, [place]

## 2021-02-08 DIAGNOSIS — N400 Enlarged prostate without lower urinary tract symptoms: Secondary | ICD-10-CM | POA: Diagnosis not present

## 2021-02-09 ENCOUNTER — Telehealth (HOSPITAL_COMMUNITY): Payer: Self-pay | Admitting: *Deleted

## 2021-02-09 NOTE — Telephone Encounter (Signed)
Close encounter 

## 2021-02-13 ENCOUNTER — Encounter (HOSPITAL_COMMUNITY): Payer: Self-pay | Admitting: *Deleted

## 2021-02-13 ENCOUNTER — Ambulatory Visit (HOSPITAL_COMMUNITY)
Admission: RE | Admit: 2021-02-13 | Discharge: 2021-02-13 | Disposition: A | Discharge status: Home / Self Care | Payer: Medicare Other | Source: Ambulatory Visit | Attending: Internal Medicine | Admitting: Internal Medicine

## 2021-02-13 ENCOUNTER — Other Ambulatory Visit: Payer: Self-pay

## 2021-02-13 DIAGNOSIS — I2510 Atherosclerotic heart disease of native coronary artery without angina pectoris: Secondary | ICD-10-CM

## 2021-02-13 LAB — EXERCISE TOLERANCE TEST
Estimated workload: 16.4 METS
Exercise duration (min): 13 min
Exercise duration (sec): 32 s
MPHR: 146 {beats}/min
Peak HR: 142 {beats}/min
Percent HR: 97 %
Rest HR: 60 {beats}/min

## 2021-02-13 NOTE — Progress Notes (Unsigned)
Abnormal ETT was reviewed by Dr. Debara Pickett. Patient's release was approved and could be discharged.

## 2021-02-16 ENCOUNTER — Encounter: Payer: Self-pay | Admitting: *Deleted

## 2021-02-16 DIAGNOSIS — Z8551 Personal history of malignant neoplasm of bladder: Secondary | ICD-10-CM | POA: Diagnosis not present

## 2021-02-16 DIAGNOSIS — R1031 Right lower quadrant pain: Secondary | ICD-10-CM | POA: Diagnosis not present

## 2021-02-16 DIAGNOSIS — N5201 Erectile dysfunction due to arterial insufficiency: Secondary | ICD-10-CM | POA: Diagnosis not present

## 2021-02-16 DIAGNOSIS — N312 Flaccid neuropathic bladder, not elsewhere classified: Secondary | ICD-10-CM | POA: Diagnosis not present

## 2021-02-21 DIAGNOSIS — R1031 Right lower quadrant pain: Secondary | ICD-10-CM | POA: Diagnosis not present

## 2021-02-21 DIAGNOSIS — K4090 Unilateral inguinal hernia, without obstruction or gangrene, not specified as recurrent: Secondary | ICD-10-CM | POA: Diagnosis not present

## 2021-02-21 DIAGNOSIS — M6281 Muscle weakness (generalized): Secondary | ICD-10-CM | POA: Diagnosis not present

## 2021-04-13 DIAGNOSIS — Z8551 Personal history of malignant neoplasm of bladder: Secondary | ICD-10-CM | POA: Diagnosis not present

## 2021-04-16 DIAGNOSIS — I129 Hypertensive chronic kidney disease with stage 1 through stage 4 chronic kidney disease, or unspecified chronic kidney disease: Secondary | ICD-10-CM | POA: Diagnosis not present

## 2021-04-16 DIAGNOSIS — Z7901 Long term (current) use of anticoagulants: Secondary | ICD-10-CM | POA: Diagnosis not present

## 2021-04-16 DIAGNOSIS — E1122 Type 2 diabetes mellitus with diabetic chronic kidney disease: Secondary | ICD-10-CM | POA: Diagnosis not present

## 2021-04-16 DIAGNOSIS — E785 Hyperlipidemia, unspecified: Secondary | ICD-10-CM | POA: Diagnosis not present

## 2021-04-18 DIAGNOSIS — Z7901 Long term (current) use of anticoagulants: Secondary | ICD-10-CM | POA: Diagnosis not present

## 2021-04-18 DIAGNOSIS — Z86718 Personal history of other venous thrombosis and embolism: Secondary | ICD-10-CM | POA: Diagnosis not present

## 2021-04-18 DIAGNOSIS — E785 Hyperlipidemia, unspecified: Secondary | ICD-10-CM | POA: Diagnosis not present

## 2021-04-18 DIAGNOSIS — I129 Hypertensive chronic kidney disease with stage 1 through stage 4 chronic kidney disease, or unspecified chronic kidney disease: Secondary | ICD-10-CM | POA: Diagnosis not present

## 2021-04-18 DIAGNOSIS — E1122 Type 2 diabetes mellitus with diabetic chronic kidney disease: Secondary | ICD-10-CM | POA: Diagnosis not present

## 2021-04-18 DIAGNOSIS — I2510 Atherosclerotic heart disease of native coronary artery without angina pectoris: Secondary | ICD-10-CM | POA: Diagnosis not present

## 2021-06-12 DIAGNOSIS — Z85828 Personal history of other malignant neoplasm of skin: Secondary | ICD-10-CM | POA: Diagnosis not present

## 2021-06-12 DIAGNOSIS — L570 Actinic keratosis: Secondary | ICD-10-CM | POA: Diagnosis not present

## 2021-06-12 DIAGNOSIS — L820 Inflamed seborrheic keratosis: Secondary | ICD-10-CM | POA: Diagnosis not present

## 2021-06-12 DIAGNOSIS — Z125 Encounter for screening for malignant neoplasm of prostate: Secondary | ICD-10-CM | POA: Diagnosis not present

## 2021-06-12 DIAGNOSIS — I129 Hypertensive chronic kidney disease with stage 1 through stage 4 chronic kidney disease, or unspecified chronic kidney disease: Secondary | ICD-10-CM | POA: Diagnosis not present

## 2021-06-14 DIAGNOSIS — M8580 Other specified disorders of bone density and structure, unspecified site: Secondary | ICD-10-CM | POA: Diagnosis not present

## 2021-06-14 DIAGNOSIS — Z8673 Personal history of transient ischemic attack (TIA), and cerebral infarction without residual deficits: Secondary | ICD-10-CM | POA: Diagnosis not present

## 2021-06-14 DIAGNOSIS — I129 Hypertensive chronic kidney disease with stage 1 through stage 4 chronic kidney disease, or unspecified chronic kidney disease: Secondary | ICD-10-CM | POA: Diagnosis not present

## 2021-06-14 DIAGNOSIS — I2510 Atherosclerotic heart disease of native coronary artery without angina pectoris: Secondary | ICD-10-CM | POA: Diagnosis not present

## 2021-06-14 DIAGNOSIS — E349 Endocrine disorder, unspecified: Secondary | ICD-10-CM | POA: Diagnosis not present

## 2021-06-14 DIAGNOSIS — N5201 Erectile dysfunction due to arterial insufficiency: Secondary | ICD-10-CM | POA: Diagnosis not present

## 2021-06-14 DIAGNOSIS — D696 Thrombocytopenia, unspecified: Secondary | ICD-10-CM | POA: Diagnosis not present

## 2021-06-14 DIAGNOSIS — D6859 Other primary thrombophilia: Secondary | ICD-10-CM | POA: Diagnosis not present

## 2021-06-14 DIAGNOSIS — Z8551 Personal history of malignant neoplasm of bladder: Secondary | ICD-10-CM | POA: Diagnosis not present

## 2021-06-14 DIAGNOSIS — E785 Hyperlipidemia, unspecified: Secondary | ICD-10-CM | POA: Diagnosis not present

## 2021-06-14 DIAGNOSIS — E1122 Type 2 diabetes mellitus with diabetic chronic kidney disease: Secondary | ICD-10-CM | POA: Diagnosis not present

## 2021-06-14 DIAGNOSIS — Z0000 Encounter for general adult medical examination without abnormal findings: Secondary | ICD-10-CM | POA: Diagnosis not present

## 2021-08-15 ENCOUNTER — Encounter: Payer: Self-pay | Admitting: Cardiovascular Disease

## 2021-08-15 DIAGNOSIS — E291 Testicular hypofunction: Secondary | ICD-10-CM | POA: Diagnosis not present

## 2021-08-15 DIAGNOSIS — I129 Hypertensive chronic kidney disease with stage 1 through stage 4 chronic kidney disease, or unspecified chronic kidney disease: Secondary | ICD-10-CM | POA: Diagnosis not present

## 2021-08-15 DIAGNOSIS — E7800 Pure hypercholesterolemia, unspecified: Secondary | ICD-10-CM | POA: Diagnosis not present

## 2021-08-15 DIAGNOSIS — E1122 Type 2 diabetes mellitus with diabetic chronic kidney disease: Secondary | ICD-10-CM | POA: Diagnosis not present

## 2021-08-15 DIAGNOSIS — Z86718 Personal history of other venous thrombosis and embolism: Secondary | ICD-10-CM | POA: Diagnosis not present

## 2021-08-23 DIAGNOSIS — D0462 Carcinoma in situ of skin of left upper limb, including shoulder: Secondary | ICD-10-CM | POA: Diagnosis not present

## 2021-08-23 DIAGNOSIS — D485 Neoplasm of uncertain behavior of skin: Secondary | ICD-10-CM | POA: Diagnosis not present

## 2021-08-23 DIAGNOSIS — D0472 Carcinoma in situ of skin of left lower limb, including hip: Secondary | ICD-10-CM | POA: Diagnosis not present

## 2021-08-23 DIAGNOSIS — L821 Other seborrheic keratosis: Secondary | ICD-10-CM | POA: Diagnosis not present

## 2021-08-23 DIAGNOSIS — Z85828 Personal history of other malignant neoplasm of skin: Secondary | ICD-10-CM | POA: Diagnosis not present

## 2021-08-23 DIAGNOSIS — Z85820 Personal history of malignant melanoma of skin: Secondary | ICD-10-CM | POA: Diagnosis not present

## 2021-08-23 DIAGNOSIS — L570 Actinic keratosis: Secondary | ICD-10-CM | POA: Diagnosis not present

## 2021-08-23 DIAGNOSIS — D1801 Hemangioma of skin and subcutaneous tissue: Secondary | ICD-10-CM | POA: Diagnosis not present

## 2021-08-28 DIAGNOSIS — E1122 Type 2 diabetes mellitus with diabetic chronic kidney disease: Secondary | ICD-10-CM | POA: Diagnosis not present

## 2021-08-28 DIAGNOSIS — E785 Hyperlipidemia, unspecified: Secondary | ICD-10-CM | POA: Diagnosis not present

## 2021-08-28 DIAGNOSIS — I129 Hypertensive chronic kidney disease with stage 1 through stage 4 chronic kidney disease, or unspecified chronic kidney disease: Secondary | ICD-10-CM | POA: Diagnosis not present

## 2021-08-28 DIAGNOSIS — N182 Chronic kidney disease, stage 2 (mild): Secondary | ICD-10-CM | POA: Diagnosis not present

## 2021-09-20 ENCOUNTER — Telehealth: Payer: Self-pay | Admitting: Cardiovascular Disease

## 2021-09-20 NOTE — Telephone Encounter (Signed)
° °  Pt c/o medication issue:  1. Name of Medication: meds for runny nose  2. How are you currently taking this medication (dosage and times per day)?   3. Are you having a reaction (difficulty breathing--STAT)?   4. What is your medication issue? Pt is in [place] and in [place], he is having runny nose. He wanted to check with Dr. Loletha Grayer which over the counter meds he can take with his heart meds

## 2021-09-20 NOTE — Telephone Encounter (Signed)
Spoke with patient - he was in pharmacy, and found zyrtec 10mg .  Advised that this would be fine.

## 2021-10-26 DIAGNOSIS — N182 Chronic kidney disease, stage 2 (mild): Secondary | ICD-10-CM | POA: Diagnosis not present

## 2021-10-26 DIAGNOSIS — E1122 Type 2 diabetes mellitus with diabetic chronic kidney disease: Secondary | ICD-10-CM | POA: Diagnosis not present

## 2021-10-26 DIAGNOSIS — E785 Hyperlipidemia, unspecified: Secondary | ICD-10-CM | POA: Diagnosis not present

## 2021-10-26 DIAGNOSIS — I129 Hypertensive chronic kidney disease with stage 1 through stage 4 chronic kidney disease, or unspecified chronic kidney disease: Secondary | ICD-10-CM | POA: Diagnosis not present

## 2021-11-13 DIAGNOSIS — E291 Testicular hypofunction: Secondary | ICD-10-CM | POA: Diagnosis not present

## 2021-11-13 DIAGNOSIS — E7800 Pure hypercholesterolemia, unspecified: Secondary | ICD-10-CM | POA: Diagnosis not present

## 2021-11-13 DIAGNOSIS — I129 Hypertensive chronic kidney disease with stage 1 through stage 4 chronic kidney disease, or unspecified chronic kidney disease: Secondary | ICD-10-CM | POA: Diagnosis not present

## 2021-11-13 DIAGNOSIS — Z86718 Personal history of other venous thrombosis and embolism: Secondary | ICD-10-CM | POA: Diagnosis not present

## 2021-11-13 DIAGNOSIS — E1122 Type 2 diabetes mellitus with diabetic chronic kidney disease: Secondary | ICD-10-CM | POA: Diagnosis not present

## 2021-11-20 DIAGNOSIS — E7800 Pure hypercholesterolemia, unspecified: Secondary | ICD-10-CM | POA: Diagnosis not present

## 2021-11-20 DIAGNOSIS — Z7901 Long term (current) use of anticoagulants: Secondary | ICD-10-CM | POA: Diagnosis not present

## 2021-11-20 DIAGNOSIS — E1122 Type 2 diabetes mellitus with diabetic chronic kidney disease: Secondary | ICD-10-CM | POA: Diagnosis not present

## 2021-11-20 DIAGNOSIS — I129 Hypertensive chronic kidney disease with stage 1 through stage 4 chronic kidney disease, or unspecified chronic kidney disease: Secondary | ICD-10-CM | POA: Diagnosis not present

## 2021-11-20 DIAGNOSIS — Z86718 Personal history of other venous thrombosis and embolism: Secondary | ICD-10-CM | POA: Diagnosis not present

## 2022-01-07 ENCOUNTER — Other Ambulatory Visit (HOSPITAL_COMMUNITY): Payer: Self-pay | Admitting: Orthopedic Surgery

## 2022-01-07 ENCOUNTER — Other Ambulatory Visit: Payer: Self-pay | Admitting: Orthopedic Surgery

## 2022-01-07 DIAGNOSIS — M25511 Pain in right shoulder: Secondary | ICD-10-CM

## 2022-01-09 DIAGNOSIS — E7800 Pure hypercholesterolemia, unspecified: Secondary | ICD-10-CM | POA: Diagnosis not present

## 2022-01-09 DIAGNOSIS — I129 Hypertensive chronic kidney disease with stage 1 through stage 4 chronic kidney disease, or unspecified chronic kidney disease: Secondary | ICD-10-CM | POA: Diagnosis not present

## 2022-01-09 DIAGNOSIS — Z7901 Long term (current) use of anticoagulants: Secondary | ICD-10-CM | POA: Diagnosis not present

## 2022-01-09 DIAGNOSIS — E1122 Type 2 diabetes mellitus with diabetic chronic kidney disease: Secondary | ICD-10-CM | POA: Diagnosis not present

## 2022-01-09 DIAGNOSIS — Z86718 Personal history of other venous thrombosis and embolism: Secondary | ICD-10-CM | POA: Diagnosis not present

## 2022-01-22 ENCOUNTER — Ambulatory Visit (HOSPITAL_COMMUNITY)
Admission: RE | Admit: 2022-01-22 | Discharge: 2022-01-22 | Disposition: A | Discharge status: Home / Self Care | Payer: No Typology Code available for payment source | Source: Ambulatory Visit | Attending: Orthopedic Surgery | Admitting: Orthopedic Surgery

## 2022-01-22 DIAGNOSIS — M25511 Pain in right shoulder: Secondary | ICD-10-CM | POA: Diagnosis present

## 2022-01-22 DIAGNOSIS — M25512 Pain in left shoulder: Secondary | ICD-10-CM | POA: Insufficient documentation

## 2022-02-14 DIAGNOSIS — E1122 Type 2 diabetes mellitus with diabetic chronic kidney disease: Secondary | ICD-10-CM | POA: Diagnosis not present

## 2022-02-14 DIAGNOSIS — Z86718 Personal history of other venous thrombosis and embolism: Secondary | ICD-10-CM | POA: Diagnosis not present

## 2022-02-14 DIAGNOSIS — I129 Hypertensive chronic kidney disease with stage 1 through stage 4 chronic kidney disease, or unspecified chronic kidney disease: Secondary | ICD-10-CM | POA: Diagnosis not present

## 2022-02-14 DIAGNOSIS — E7800 Pure hypercholesterolemia, unspecified: Secondary | ICD-10-CM | POA: Diagnosis not present

## 2022-02-19 DIAGNOSIS — I129 Hypertensive chronic kidney disease with stage 1 through stage 4 chronic kidney disease, or unspecified chronic kidney disease: Secondary | ICD-10-CM | POA: Diagnosis not present

## 2022-02-19 DIAGNOSIS — E785 Hyperlipidemia, unspecified: Secondary | ICD-10-CM | POA: Diagnosis not present

## 2022-02-19 DIAGNOSIS — E1122 Type 2 diabetes mellitus with diabetic chronic kidney disease: Secondary | ICD-10-CM | POA: Diagnosis not present

## 2022-03-27 ENCOUNTER — Ambulatory Visit: Payer: Medicare Other | Attending: Nurse Practitioner | Admitting: Nurse Practitioner

## 2022-03-27 ENCOUNTER — Encounter: Payer: Self-pay | Admitting: Nurse Practitioner

## 2022-03-27 VITALS — BP 124/82 | HR 57 | Ht 69.0 in | Wt 172.0 lb

## 2022-03-27 DIAGNOSIS — Z86718 Personal history of other venous thrombosis and embolism: Secondary | ICD-10-CM | POA: Insufficient documentation

## 2022-03-27 DIAGNOSIS — I2510 Atherosclerotic heart disease of native coronary artery without angina pectoris: Secondary | ICD-10-CM | POA: Diagnosis not present

## 2022-03-27 DIAGNOSIS — I10 Essential (primary) hypertension: Secondary | ICD-10-CM | POA: Insufficient documentation

## 2022-03-27 DIAGNOSIS — E785 Hyperlipidemia, unspecified: Secondary | ICD-10-CM | POA: Insufficient documentation

## 2022-03-27 DIAGNOSIS — E119 Type 2 diabetes mellitus without complications: Secondary | ICD-10-CM | POA: Insufficient documentation

## 2022-03-27 DIAGNOSIS — Z7901 Long term (current) use of anticoagulants: Secondary | ICD-10-CM | POA: Insufficient documentation

## 2022-03-27 DIAGNOSIS — Z8673 Personal history of transient ischemic attack (TIA), and cerebral infarction without residual deficits: Secondary | ICD-10-CM | POA: Diagnosis not present

## 2022-03-27 NOTE — Progress Notes (Signed)
Office Visit    Patient Name: Jacob Hensley Date of Encounter: 03/27/2022  Primary Care Provider:  Deland Pretty, MD Primary Cardiologist:  Sanda Klein, MD  Chief Complaint   76 year old male with a history of CAD s/p CABG x4 in 2003, hypertension, hyperlipidemia, CVA, recurrent DVT on anticoagulation, and type 2 diabetes who presents for follow-up related to CAD.   Past Medical History    Past Medical History:  Diagnosis Date   BPH (benign prostatic hyperplasia)    Coronary artery disease    CABG 2003-DR. CROITORU IS PT'S CARDIOLOGIST - LAST OFFICE VISIT 08/02/11-PT EXERCISES DAILY-NO C/O OF CHEST PAIN   Diabetes mellitus    BORDERLINE - PT CHECKS HIS BLOOD SUGARS AT HOME - NO MEDS   DVT of leg (deep venous thrombosis) ([place]) 2003   S/P CABG SURGERY   Hyperlipidemia    Hypertension    Melanoma ([place])    Back   Stroke ([place])    Urine retention    PT STATES HE HAS BEEN DOING I&O CATHS FOR PAST 3 YRS   Past Surgical History:  Procedure Laterality Date   CATARACT EXTRACTION W/ INTRAOCULAR LENS  IMPLANT, BILATERAL     COLONOSCOPY     Dr. Verdia Kuba   CORONARY ARTERY BYPASS GRAFT  02/05/2002   LIMA to LAD,RIMA to PDA,SVG to diagonal & free left radial arter to the oblique marginal artery - Dr. Cyndia Bent   CPET w/PFT  1//20/2014   Normal   CYSTOSCOPY WITH BIOPSY  11/08/2011   Procedure: CYSTOSCOPY WITH BIOPSY;  Surgeon: Fredricka Bonine, MD;  [facility];  Service: Urology;;  Bladder biopsies   CYSTOSCOPY WITH BIOPSY  06/19/2012   Procedure: CYSTOSCOPY WITH BIOPSY;  Surgeon: Fredricka Bonine, MD;  [facility];  Service: Urology;  Laterality: N/A;  fulgaration of bleeders   INGUINAL HERNIA REPAIR Right 10/31/2020   Procedure: RIGHT OPEN INGUINAL HERNIA REPAIR WITH MESH;  Surgeon: Kinsinger, Arta Bruce, MD;  Location: [facility];  Service: General;  Laterality: Right;  60/RM5   MELANOMA EXCISION  2012   TONSILLECTOMY     TRANSURETHRAL RESECTION OF PROSTATE   11-08-2011   US ECHOCARDIOGRAPHY  04/14/2009   EF >50%,borderline dilated RV w/normal systolic fx,mildly dilated LA,mild AI,MR,TR.    Allergies  No Known Allergies  History of Present Illness    76 year old male with the above past medical history including CAD s/p CABG x4 in 2003, hypertension, hyperlipidemia, CVA, recurrent DVT on anticoagulation, and type 2 diabetes.  He has a history of CAD s/p CABG x4 (LIMA to LAD,RIMA to PDA,SVG to diagonal & free left radial arter to the oblique marginal artery) in 2003.  He has done well since the time of his surgery.  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  He is known to have "false positive" ST segment response, which has been the case ever since his bypass surgery.  He has historically demonstrated good size tolerance.  He has a history of PFO and had a small pontine infarct in July 2018, felt to be paradoxical embolism since he also had a subacute right lower extremity DVT at the time.  He is on lifelong anticoagulation with Eliquis.  Most recent echo in 2018 showed EF 60 to 65%, G1DD, mild aortic valve regurgitation.  He was last seen in the office on 02/01/2021 and was stable from a cardiac standpoint.  He denies any symptoms concerning for angina.  ETT in 01/2021 demonstrated stable exercise tolerance, stable EKG changes.    He presents today for follow-up. Since his last visit has done well from a cardiac standpoint.  He remains very active and exercises regularly.  He denies symptoms concerning for angina.  He has noted some mild nonpitting bilateral lower extremity edema since starting amlodipine.  BP has been overall well controlled.  He is due for his annual ETT and was requesting that this be completed.  He states he just had an echocardiogram completed yesterday through the [place], results not available for review.  He has follow-up scheduled with his PCP for ongoing monitoring of BP medication titration.   Overall he reports feeling well and denies any additional concerns today.  Home Medications    Current Outpatient Medications  Medication Sig Dispense Refill   ACCU-CHEK GUIDE test strip every other day.     amLODipine (NORVASC) 5 MG tablet Take 5 mg by mouth daily.     apixaban (ELIQUIS) 5 MG TABS tablet Take 1 tablet (5 mg total) by mouth 2 (two) times daily. 180 tablet 0   Ascorbic Acid (VITAMIN C) 1000 MG tablet Take 1,000 mg by mouth daily.     Cholecalciferol (VITAMIN D3) 125 MCG (5000 UT) TABS Take 1 tablet by mouth daily in the afternoon.     Empagliflozin (JARDIANCE PO) Take 12.5 mg by mouth in the morning.     ezetimibe (ZETIA) 10 MG tablet Take 1 tablet (10 mg total) by mouth daily. 90 tablet 3   hydrochlorothiazide (HYDRODIURIL) 25 MG tablet Take 25 mg by mouth daily.     metFORMIN (GLUCOPHAGE) 500 MG tablet Take 500 mg by mouth 2 (two) times daily.     metoprolol succinate (TOPROL-XL) 25 MG 24 hr tablet Take 1 tablet (25 mg total) by mouth daily before breakfast. 90 tablet 3   metroNIDAZOLE (METROGEL) 0.75 % gel Apply 1 Application topically as needed.     Multiple Vitamins-Minerals (MULTIVITAMIN WITH MINERALS) tablet Take 1 tablet by mouth daily.     ramipril (ALTACE) 10 MG capsule Take 10 mg by mouth at bedtime.     rosuvastatin (CRESTOR) 40 MG tablet Take 1 tablet (40 mg total) by mouth at bedtime. 90 tablet 3   No current facility-administered medications for this visit.     Review of Systems    He denies chest pain, palpitations, dyspnea, pnd, orthopnea, n, v, dizziness, syncope, edema, weight gain, or early satiety. All other systems reviewed and are otherwise negative except as noted above.   Physical Exam    VS:  BP 124/82 (BP Location: Left Arm, Patient Position: Sitting, Cuff Size: Normal)   Pulse (!) 57   Ht '5\' 9"'$  (1.753 m)   Wt 172 lb (78 kg)   SpO2 97%   BMI 25.40 kg/m   GEN: Well nourished, well developed, in no acute distress. HEENT: normal. Neck:  Supple, no JVD, carotid bruits, or masses. Cardiac: RRR, no murmurs, rubs, or gallops. No clubbing, cyanosis, nonpitting bilateral ankle edema.  Radials/DP/PT 2+ and equal bilaterally.  Respiratory:  Respirations regular and unlabored, clear to auscultation bilaterally. GI: Soft, nontender, nondistended, BS + x 4. MS: no deformity or atrophy. Skin: warm and dry, no rash. Neuro:  Strength and sensation are intact. Psych: Normal affect.  Accessory Clinical Findings    ECG personally reviewed by me today -sinus bradycardia, 57 bpm, sinus arrhythmia, first-degree AV block- no acute changes.   Lab Results  Component Value Date  WBC 6.3 10/31/2020   HGB 15.7 10/31/2020   HCT 44.6 10/31/2020   MCV 85.9 10/31/2020   PLT 195 10/31/2020   Lab Results  Component Value Date   CREATININE 0.73 10/31/2020   BUN 18 10/31/2020   NA 137 10/31/2020   K 4.1 10/31/2020   CL 106 10/31/2020   CO2 24 10/31/2020   Lab Results  Component Value Date   ALT 32 08/24/2019   AST 28 08/24/2019   ALKPHOS 59 08/24/2019   BILITOT 0.8 08/24/2019   Lab Results  Component Value Date   CHOL 149 01/15/2019   HDL 54 01/15/2019   LDLCALC 75 01/15/2019   TRIG 102 01/15/2019   CHOLHDL 2.8 01/15/2019    Lab Results  Component Value Date   HGBA1C 7.3 (H) 10/31/2020    Assessment & Plan    1. CAD: S/p CABG x4 (LIMA to LAD,RIMA to PDA,SVG to diagonal & free left radial arter to the oblique marginal artery) in 2003. He is known to have "false positive" ST segment response, which has been the case ever since his bypass surgery. ETT in 01/2021 demonstrated stable exercise tolerance, stable EKG changes. Stable with no anginal symptoms. He is due for his annual ETT for DOT purposes.  Will order this today.   Not on ASA in the setting of chronic DOAC therapy. Continue ramipril, amlodipine, metoprolol, hydrochlorothiazide, Zetia, and Crestor.   Shared Decision Making/Informed Consent The risks [chest pain, shortness  of breath, cardiac arrhythmias, dizziness, blood pressure fluctuations, myocardial infarction, stroke/transient ischemic attack, and life-threatening complications (estimated to be 1 in 10,000)], benefits (risk stratification, diagnosing coronary artery disease, treatment guidance) and alternatives of an exercise tolerance test were discussed in detail with Mr. Frenette and he agrees to proceed.  2. Hypertension: BP well controlled.  He has noted some mildly elevated BP readings at home.  However, he thinks this is mostly in the setting of anxiety.  Continue to monitor BP and report BP consistently  >130/80.  If BP remains above goal, consider transitioning ramipril to ARB such as valsartan.  I doubt he will tolerate increased dose of amlodipine given new lower extremity edema.  For now, continue current antihypertensive regimen.    3. Hyperlipidemia: LDL was 81 in 09/2020.   He has not had labs since starting Zetia.  We will repeat fasting lipids, LFTs. Continue rosuvastatin and zetia.  4. H/o CVA: He has a PFO and had a small pontine infarct in July 2018, felt to be paradoxical embolism since he has also had a subacute right lower extremity DVT at the time. On Eliquis.   5. Recurrent DVT: On chronic anticoagulation with Eliquis.   6. Type 2 diabetes: A1C was in 6.9 in 07/2021.  Monitored and managed per PCP.    7. Disposition: Follow-up in 6 months.       Lenna Sciara, NP 03/27/2022, 1:04 PM

## 2022-03-27 NOTE — Patient Instructions (Signed)
Medication Instructions:  Your physician recommends that you continue on your current medications as directed. Please refer to the Current Medication list given to you today.   *If you need a refill on your cardiac medications before your next appointment, please call your pharmacy*   Lab Work: Your physician recommends that you return for lab work in 1-2 weeks Fasting lipid panel LFTs  If you have labs (blood work) drawn today and your tests are completely normal, you will receive your results only by: [place] (if you have MyChart) OR A paper copy in the mail If you have any lab test that is abnormal or we need to change your treatment, we will call you to review the results.   Testing/Procedure Exericse Tolerence Test   Follow-Up: At [facility], you and your health needs are our priority.  As part of our continuing mission to provide you with exceptional heart care, we have created designated Provider Care Teams.  These Care Teams include your primary Cardiologist (physician) and Advanced Practice Providers (APPs -  Physician Assistants and Nurse Practitioners) who all work together to provide you with the care you need, when you need it.  We recommend signing up for the patient portal called "MyChart".  Sign up information is provided on this After Visit Summary.  MyChart is used to connect with patients for Virtual Visits (Telemedicine).  Patients are able to view lab/test results, encounter notes, upcoming appointments, etc.  Non-urgent messages can be sent to your provider as well.   To learn more about what you can do with MyChart, go to [email or web address].    Your next appointment:   6 month(s)  The format for your next appointment:   In Person  Provider:   Sanda Klein, MD     Other Instructions   Important Information About Sugar

## 2022-03-28 DIAGNOSIS — E785 Hyperlipidemia, unspecified: Secondary | ICD-10-CM | POA: Diagnosis not present

## 2022-03-28 DIAGNOSIS — I129 Hypertensive chronic kidney disease with stage 1 through stage 4 chronic kidney disease, or unspecified chronic kidney disease: Secondary | ICD-10-CM | POA: Diagnosis not present

## 2022-03-28 DIAGNOSIS — E1122 Type 2 diabetes mellitus with diabetic chronic kidney disease: Secondary | ICD-10-CM | POA: Diagnosis not present

## 2022-04-03 ENCOUNTER — Telehealth (HOSPITAL_COMMUNITY): Payer: Self-pay | Admitting: *Deleted

## 2022-04-03 NOTE — Telephone Encounter (Signed)
Close encounter 

## 2022-04-04 ENCOUNTER — Ambulatory Visit (HOSPITAL_COMMUNITY)
Admission: RE | Admit: 2022-04-04 | Discharge: 2022-04-04 | Disposition: A | Discharge status: Home / Self Care | Payer: Medicare Other | Source: Ambulatory Visit | Attending: Cardiology | Admitting: Cardiology

## 2022-04-04 DIAGNOSIS — I2510 Atherosclerotic heart disease of native coronary artery without angina pectoris: Secondary | ICD-10-CM | POA: Diagnosis not present

## 2022-04-04 LAB — EXERCISE TOLERANCE TEST
Estimated workload: 15.4
Exercise duration (min): 13 min
Exercise duration (sec): 6 s
MPHR: 145 {beats}/min
Peak HR: 137 {beats}/min
Percent HR: 94 %
Rest HR: 53 {beats}/min

## 2022-04-05 LAB — LIPID PANEL
Chol/HDL Ratio: 2.5 ratio (ref 0.0–5.0)
Cholesterol, Total: 123 mg/dL (ref 100–199)
HDL: 49 mg/dL (ref >39)
LDL Chol Calc (NIH): 58 mg/dL (ref 0–99)
Triglycerides: 79 mg/dL (ref 0–149)
VLDL Cholesterol Cal: 16 mg/dL (ref 5–40)

## 2022-04-05 LAB — HEPATIC FUNCTION PANEL
ALT: 28 IU/L (ref 0–44)
AST: 24 IU/L (ref 0–40)
Albumin: 4.7 g/dL (ref 3.8–4.8)
Alkaline Phosphatase: 79 IU/L (ref 44–121)
Bilirubin Total: 0.5 mg/dL (ref 0.0–1.2)
Bilirubin, Direct: 0.18 mg/dL (ref 0.00–0.40)
Total Protein: 7 g/dL (ref 6.0–8.5)

## 2022-04-11 ENCOUNTER — Encounter: Payer: Self-pay | Admitting: Cardiovascular Disease

## 2022-04-11 ENCOUNTER — Encounter: Payer: Self-pay | Admitting: *Deleted

## 2022-04-11 NOTE — Telephone Encounter (Signed)
Yes that is OK. He did great on his treadmill test. Should be an older letter in [facility]

## 2022-04-12 DIAGNOSIS — Z8551 Personal history of malignant neoplasm of bladder: Secondary | ICD-10-CM | POA: Diagnosis not present

## 2022-04-12 DIAGNOSIS — N400 Enlarged prostate without lower urinary tract symptoms: Secondary | ICD-10-CM | POA: Diagnosis not present

## 2022-04-18 ENCOUNTER — Telehealth: Payer: Self-pay

## 2022-04-18 NOTE — Telephone Encounter (Signed)
Spoke with pt. Pt was notified of exercise tolerance results and will follow up as planned.

## 2022-06-18 DIAGNOSIS — L570 Actinic keratosis: Secondary | ICD-10-CM | POA: Diagnosis not present

## 2022-06-18 DIAGNOSIS — C4442 Squamous cell carcinoma of skin of scalp and neck: Secondary | ICD-10-CM | POA: Diagnosis not present

## 2022-06-18 DIAGNOSIS — D485 Neoplasm of uncertain behavior of skin: Secondary | ICD-10-CM | POA: Diagnosis not present

## 2022-06-18 DIAGNOSIS — D0462 Carcinoma in situ of skin of left upper limb, including shoulder: Secondary | ICD-10-CM | POA: Diagnosis not present

## 2022-06-18 DIAGNOSIS — Z85828 Personal history of other malignant neoplasm of skin: Secondary | ICD-10-CM | POA: Diagnosis not present

## 2022-06-18 DIAGNOSIS — D044 Carcinoma in situ of skin of scalp and neck: Secondary | ICD-10-CM | POA: Diagnosis not present

## 2022-06-18 DIAGNOSIS — L820 Inflamed seborrheic keratosis: Secondary | ICD-10-CM | POA: Diagnosis not present

## 2022-08-12 DIAGNOSIS — E1122 Type 2 diabetes mellitus with diabetic chronic kidney disease: Secondary | ICD-10-CM | POA: Diagnosis not present

## 2022-08-12 DIAGNOSIS — E785 Hyperlipidemia, unspecified: Secondary | ICD-10-CM | POA: Diagnosis not present

## 2022-08-12 DIAGNOSIS — Z125 Encounter for screening for malignant neoplasm of prostate: Secondary | ICD-10-CM | POA: Diagnosis not present

## 2022-08-15 DIAGNOSIS — E1122 Type 2 diabetes mellitus with diabetic chronic kidney disease: Secondary | ICD-10-CM | POA: Diagnosis not present

## 2022-08-15 DIAGNOSIS — M7022 Olecranon bursitis, left elbow: Secondary | ICD-10-CM | POA: Diagnosis not present

## 2022-08-15 DIAGNOSIS — I10 Essential (primary) hypertension: Secondary | ICD-10-CM | POA: Diagnosis not present

## 2022-08-15 DIAGNOSIS — M8580 Other specified disorders of bone density and structure, unspecified site: Secondary | ICD-10-CM | POA: Diagnosis not present

## 2022-08-15 DIAGNOSIS — I2510 Atherosclerotic heart disease of native coronary artery without angina pectoris: Secondary | ICD-10-CM | POA: Diagnosis not present

## 2022-08-15 DIAGNOSIS — Z85820 Personal history of malignant melanoma of skin: Secondary | ICD-10-CM | POA: Diagnosis not present

## 2022-08-15 DIAGNOSIS — Z8673 Personal history of transient ischemic attack (TIA), and cerebral infarction without residual deficits: Secondary | ICD-10-CM | POA: Diagnosis not present

## 2022-08-15 DIAGNOSIS — Z0000 Encounter for general adult medical examination without abnormal findings: Secondary | ICD-10-CM | POA: Diagnosis not present

## 2022-08-15 DIAGNOSIS — Z8551 Personal history of malignant neoplasm of bladder: Secondary | ICD-10-CM | POA: Diagnosis not present

## 2022-08-15 DIAGNOSIS — D6859 Other primary thrombophilia: Secondary | ICD-10-CM | POA: Diagnosis not present

## 2022-08-28 DIAGNOSIS — L570 Actinic keratosis: Secondary | ICD-10-CM | POA: Diagnosis not present

## 2022-08-28 DIAGNOSIS — L905 Scar conditions and fibrosis of skin: Secondary | ICD-10-CM | POA: Diagnosis not present

## 2022-08-28 DIAGNOSIS — Z85828 Personal history of other malignant neoplasm of skin: Secondary | ICD-10-CM | POA: Diagnosis not present

## 2022-08-28 DIAGNOSIS — C44629 Squamous cell carcinoma of skin of left upper limb, including shoulder: Secondary | ICD-10-CM | POA: Diagnosis not present

## 2022-08-28 DIAGNOSIS — Z85820 Personal history of malignant melanoma of skin: Secondary | ICD-10-CM | POA: Diagnosis not present

## 2022-08-28 DIAGNOSIS — D485 Neoplasm of uncertain behavior of skin: Secondary | ICD-10-CM | POA: Diagnosis not present

## 2022-08-28 DIAGNOSIS — C44222 Squamous cell carcinoma of skin of right ear and external auricular canal: Secondary | ICD-10-CM | POA: Diagnosis not present

## 2022-09-02 ENCOUNTER — Encounter: Payer: Self-pay | Admitting: Cardiovascular Disease

## 2022-09-02 ENCOUNTER — Ambulatory Visit: Payer: Medicare Other | Attending: Cardiovascular Disease | Admitting: Cardiovascular Disease

## 2022-09-02 VITALS — BP 114/72 | HR 62 | Ht 69.0 in | Wt 170.8 lb

## 2022-09-02 DIAGNOSIS — E119 Type 2 diabetes mellitus without complications: Secondary | ICD-10-CM | POA: Diagnosis not present

## 2022-09-02 DIAGNOSIS — D6869 Other thrombophilia: Secondary | ICD-10-CM | POA: Insufficient documentation

## 2022-09-02 DIAGNOSIS — I2510 Atherosclerotic heart disease of native coronary artery without angina pectoris: Secondary | ICD-10-CM | POA: Insufficient documentation

## 2022-09-02 DIAGNOSIS — Z86718 Personal history of other venous thrombosis and embolism: Secondary | ICD-10-CM | POA: Insufficient documentation

## 2022-09-02 DIAGNOSIS — E7800 Pure hypercholesterolemia, unspecified: Secondary | ICD-10-CM | POA: Diagnosis not present

## 2022-09-02 DIAGNOSIS — I10 Essential (primary) hypertension: Secondary | ICD-10-CM

## 2022-09-02 DIAGNOSIS — Z8673 Personal history of transient ischemic attack (TIA), and cerebral infarction without residual deficits: Secondary | ICD-10-CM

## 2022-09-02 NOTE — Patient Instructions (Signed)
Medication Instructions:  No changes *If you need a refill on your cardiac medications before your next appointment, please call your pharmacy*  Follow-Up: At [facility], you and your health needs are our priority.  As part of our continuing mission to provide you with exceptional heart care, we have created designated Provider Care Teams.  These Care Teams include your primary Cardiologist (physician) and Advanced Practice Providers (APPs -  Physician Assistants and Nurse Practitioners) who all work together to provide you with the care you need, when you need it.  We recommend signing up for the patient portal called "MyChart".  Sign up information is provided on this After Visit Summary.  MyChart is used to connect with patients for Virtual Visits (Telemedicine).  Patients are able to view lab/test results, encounter notes, upcoming appointments, etc.  Non-urgent messages can be sent to your provider as well.   To learn more about what you can do with MyChart, go to [email or web address].    Your next appointment:   1 year(s)  Provider:   Mihai Croitoru, MD      

## 2022-09-04 NOTE — Progress Notes (Signed)
Cardiology Office Note    Date:  09/04/2022   ID:  Jacob Hensley, DOB [birth year], MRN [id number]  PCP:  Deland Pretty, MD  Cardiologist:  Sanda Klein, MD  Electrophysiologist:  None   Evaluation Performed:  Follow-Up Visit  Chief Complaint:  CAD s/p CABG  History of Present Illness:    Jacob Hensley is a 77 y.o. male with CAD s/p CABG 2003, ), diet-controlled diabetes mellitus, hypercholesterolemia, essential hypertension, returning for follow-up.  Jacob Hensley is doing quite well.  He has no cardiovascular complaints whatsoever, despite being physically active.  The patient specifically denies any chest pain at rest exertion, dyspnea at rest or with exertion, orthopnea, paroxysmal nocturnal dyspnea, syncope, palpitations, focal neurological deficits, intermittent claudication, lower extremity edema, unexplained weight gain, cough, hemoptysis or wheezing.   He goes to the gym at least 2 days, most frequently 3 days a week and other days of the week he will go for walks.  He increases his heart rate to 110-120 beats per minute, without any cardiovascular symptoms.  Family issues with his eldest son are causing some strain at home.  He thinks he is dealing better with it than his wife Jacob Hensley (she is also my patient.  He continues to track his blood pressure, heart rate, glucose level and daily exercise levels with his smart phone and all the parameters are within desirable range.  He has a healthy weight with a BMI of 25.  His most recent metabolic parameters were good with an LDL cholesterol of 58 and hemoglobin A1c of 6.7%. Last LDL a little high at 81, A1c 7.3% (now on metformin).  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   His most recent treadmill stress test was in September 2023 and he was able to exercise for over 13 minutes, much better than average for his age.  He is known to have a "false positive" ST segment response, which  has been the case ever since his bypass surgery.  He did not have angina during exercise.  Past Medical History:  Diagnosis Date   BPH (benign prostatic hyperplasia)    Coronary artery disease    CABG 2003-DR. Witney Huie IS PT'S CARDIOLOGIST - LAST OFFICE VISIT 08/02/11-PT EXERCISES DAILY-NO C/O OF CHEST PAIN   Diabetes mellitus    BORDERLINE - PT CHECKS HIS BLOOD SUGARS AT HOME - NO MEDS   DVT of leg (deep venous thrombosis) ([place]) 2003   S/P CABG SURGERY   Hyperlipidemia    Hypertension    Melanoma ([place])    Back   Stroke ([place])    Urine retention    PT STATES HE HAS BEEN DOING I&O CATHS FOR PAST 3 YRS   Past Surgical History:  Procedure Laterality Date   CATARACT EXTRACTION W/ INTRAOCULAR LENS  IMPLANT, BILATERAL     COLONOSCOPY     Dr. Verdia Kuba   CORONARY ARTERY BYPASS GRAFT  02/05/2002   LIMA to LAD,RIMA to PDA,SVG to diagonal & free left radial arter to the oblique marginal artery - Dr. Cyndia Bent   CPET w/PFT  1//20/2014   Normal   CYSTOSCOPY WITH BIOPSY  11/08/2011   Procedure: CYSTOSCOPY WITH BIOPSY;  Surgeon: Fredricka Bonine, MD;  Location: [facility];  Service: Urology;;  Bladder biopsies   CYSTOSCOPY WITH BIOPSY  06/19/2012   Procedure: CYSTOSCOPY WITH BIOPSY;  Surgeon: Fredricka Bonine, MD;  Location: [facility];  Service: Urology;  Laterality: N/A;  fulgaration of bleeders   INGUINAL HERNIA REPAIR Right 10/31/2020   Procedure: RIGHT OPEN INGUINAL HERNIA REPAIR WITH MESH;  Surgeon: Kinsinger, Arta Bruce, MD;  Location: [facility];  Service: General;  Laterality: Right;  60/RM5   MELANOMA EXCISION  2012   TONSILLECTOMY     TRANSURETHRAL RESECTION OF PROSTATE  11-08-2011   US ECHOCARDIOGRAPHY  04/14/2009   EF >50%,borderline dilated RV w/normal systolic fx,mildly dilated LA,mild AI,MR,TR.     Current Meds  Medication Sig   ACCU-CHEK GUIDE test strip every other day.   amLODipine (NORVASC) 5 MG tablet Take 5 mg by mouth daily.   apixaban (ELIQUIS) 5 MG TABS tablet Take 1  tablet (5 mg total) by mouth 2 (two) times daily.   Ascorbic Acid (VITAMIN C) 1000 MG tablet Take 1,000 mg by mouth daily.   benzonatate (TESSALON) 100 MG capsule Take 100 mg by mouth 3 (three) times daily.   Cholecalciferol (VITAMIN D3) 125 MCG (5000 UT) TABS Take 1 tablet by mouth daily in the afternoon.   Empagliflozin (JARDIANCE PO) Take 12.5 mg by mouth in the morning.   guaifenesin (HUMIBID E) 400 MG TABS tablet Take 400 mg by mouth every 4 (four) hours as needed.   hydrochlorothiazide (HYDRODIURIL) 25 MG tablet Take 25 mg by mouth daily.   metFORMIN (GLUCOPHAGE) 500 MG tablet Take 500 mg by mouth 2 (two) times daily.   metoprolol succinate (TOPROL-XL) 25 MG 24 hr tablet Take 1 tablet (25 mg total) by mouth daily before breakfast.   metroNIDAZOLE (METROGEL) 0.75 % gel Apply 1 Application topically as needed.   Multiple Vitamins-Minerals (MULTIVITAMIN WITH MINERALS) tablet Take 1 tablet by mouth daily.   ramipril (ALTACE) 10 MG capsule Take 2 capsules by mouth daily.   rosuvastatin (CRESTOR) 40 MG tablet Take 1 tablet (40 mg total) by mouth at bedtime.     Allergies:   Patient has no known allergies.   Social History   Tobacco Use   Smoking status: Never   Smokeless tobacco: Never  Vaping Use   Vaping Use: Never used  Substance Use Topics   Alcohol use: No   Drug use: No     Family Hx: The patient's family history includes Cancer in his mother; Diabetes in his father; Heart failure in his father.  ROS:   Please see the history of present illness.    All other systems are reviewed and are negative.   Prior CV studies:   The following studies were reviewed today:  ECG stress test 2021  Labs/Other Tests and Data Reviewed:    EKG: Is ordered today shows sinus bradycardia, 1st degree AV block, occ PACs, nonspecific ST changes  Recent Labs: 04/04/2022: ALT 28   Recent Lipid Panel Lab Results  Component Value Date/Time   CHOL 123 04/04/2022 09:30 AM   TRIG 79 04/04/2022  09:30 AM   HDL 49 04/04/2022 09:30 AM   CHOLHDL 2.5 04/04/2022 09:30 AM   CHOLHDL 3.6 01/27/2017 04:10 AM   LDLCALC 58 04/04/2022 09:30 AM    Wt Readings from Last 3 Encounters:  09/02/22 170 lb 12.8 oz (77.5 kg)  03/27/22 172 lb (78 kg)  02/01/21 177 lb 3.2 oz (80.4 kg)     Objective:    Vital Signs:  BP 114/72 (BP Location: Left Arm, Patient Position: Sitting, Cuff Size: Normal)   Pulse 62   Ht '5\' 9"'$  (1.753 m)   Wt 170 lb 12.8 oz (77.5 kg)   SpO2 96%   BMI  25.22 kg/m      General: Alert, oriented x3, no distress, appears younger than stated age. Head: no evidence of trauma, PERRL, EOMI, no exophtalmos or lid lag, no myxedema, no xanthelasma; normal ears, nose and oropharynx Neck: normal jugular venous pulsations and no hepatojugular reflux; brisk carotid pulses without delay and no carotid bruits Chest: clear to auscultation, no signs of consolidation by percussion or palpation, normal fremitus, symmetrical and full respiratory excursions Cardiovascular: normal position and quality of the apical impulse, regular rhythm, normal first and second heart sounds, no murmurs, rubs or gallops Abdomen: no tenderness or distention, no masses by palpation, no abnormal pulsatility or arterial bruits, normal bowel sounds, no hepatosplenomegaly Extremities: no clubbing, cyanosis or edema; 2+ radial, ulnar and brachial pulses bilaterally; 2+ right femoral, posterior tibial and dorsalis pedis pulses; 2+ left femoral, posterior tibial and dorsalis pedis pulses; no subclavian or femoral bruits Neurological: grossly nonfocal Psych: Normal mood and affect     ASSESSMENT & PLAN:    1. Coronary artery disease involving native coronary artery of native heart without angina pectoris   2. Essential hypertension   3. Pure hypercholesterolemia   4. Controlled type 2 diabetes mellitus without complication, without long-term current use of insulin ([place])   5. History of recurrent deep vein thrombosis  (DVT)   6. History of arterial ischemic stroke   7. Acquired thrombophilia ([employer])       CAD s/p CABG: Remains asymptomatic despite a very active lifestyle.  He will always have a "false positive" stress ECG.  However he has been able to exercise to a similar level for several years in a row (over 30 minutes on the Bruce protocol, with an excellent prognosis).   He is on beta-blocker and statin in a high dose.  Not on aspirin due to full anticoagulation with Eliquis. HTN: excellent control HLP: On maximum dose of rosuvastatin.  His LDL cholesterol is excellent at 58. DM: He does not need medicines to control his blood sugar and his recent hemoglobin A1c was acceptable at 6.7%. Recurrent DVT: No symptoms of leg swelling or pain..  On lifelong Eliquis.  Hx of stroke: He has a patent foramen ovale and had a small pontine infarct in July 2018, felt to be paradoxical embolism since he also had a subacute right lower extremity DVT at that time.  Oral anticoagulant should offer good protection from future neurological events. Anticoagulation: Compliant with anticoagulation with apixaban.  Has not had falls, injuries or serious bleeding problems.  No change in anticoagulant plan.   Medication Adjustments/Labs and Tests Ordered: Current medicines are reviewed at length with the patient today.  Concerns regarding medicines are outlined above.  Patient Instructions  Medication Instructions:  No changes *If you need a refill on your cardiac medications before your next appointment, please call your pharmacy*  Follow-Up: At [facility], you and your health needs are our priority.  As part of our continuing mission to provide you with exceptional heart care, we have created designated Provider Care Teams.  These Care Teams include your primary Cardiologist (physician) and Advanced Practice Providers (APPs -  Physician Assistants and Nurse Practitioners) who all work together to provide you with the  care you need, when you need it.  We recommend signing up for the patient portal called "MyChart".  Sign up information is provided on this After Visit Summary.  MyChart is used to connect with patients for Virtual Visits (Telemedicine).  Patients are able to view lab/test results,  encounter notes, upcoming appointments, etc.  Non-urgent messages can be sent to your provider as well.   To learn more about what you can do with MyChart, go to [email or web address].    Your next appointment:   1 year(s)  Provider:   Sanda Klein, MD       Signed, Sanda Klein, MD  09/04/2022 7:10 PM    [place]

## 2022-09-23 DIAGNOSIS — E1159 Type 2 diabetes mellitus with other circulatory complications: Secondary | ICD-10-CM | POA: Diagnosis not present

## 2022-09-27 DIAGNOSIS — H04203 Unspecified epiphora, bilateral lacrimal glands: Secondary | ICD-10-CM | POA: Diagnosis not present

## 2022-11-25 ENCOUNTER — Telehealth: Payer: Self-pay | Admitting: Cardiovascular Disease

## 2022-11-25 NOTE — Telephone Encounter (Signed)
EKG sent.  He also ask for last OV notes because "there is a false positive on my EKG". Last OV note sent as well. Patient notified

## 2022-11-25 NOTE — Telephone Encounter (Signed)
Patient states that the [place] is requesting him send over his latest EKG results.   Attn: Dr. Peyton Bottoms  Fax # [phone]

## 2022-11-26 ENCOUNTER — Telehealth: Payer: Self-pay | Admitting: Emergency Medicine

## 2022-11-26 NOTE — Telephone Encounter (Signed)
Faxed LOV and EKG to ATTN: Charise Carwin, PA-C at fax # [phone]; also gave the printed documents to pt's wife per pt's request.

## 2022-12-18 ENCOUNTER — Encounter: Payer: Self-pay | Admitting: Cardiovascular Disease

## 2022-12-18 ENCOUNTER — Telehealth: Payer: Self-pay | Admitting: *Deleted

## 2022-12-18 NOTE — Telephone Encounter (Signed)
   Pre-operative Risk Assessment    Patient Name: Jacob Hensley  DOB: 1945/08/19 MRN: [id number]      Request for Surgical Clearance    Procedure:   Ectropion repair and BX lower lid x 2.  Date of Surgery:  Clearance 12/27/22     and 01/10/2023                            Surgeon:  Dr. Dairl Ponder Surgeon's Group or Practice Name:  [facility] Phone number:  [phone] Fax number:  [phone]   Type of Clearance Requested:   - Medical  - Pharmacy:  Hold Apixaban (Eliquis) 2 days prior and 3 days after.   Type of Anesthesia:  MAC   Additional requests/questions:    Signed, Emmit Pomfret   12/18/2022, 10:32 AM

## 2022-12-19 ENCOUNTER — Telehealth: Payer: Self-pay | Admitting: *Deleted

## 2022-12-19 NOTE — Telephone Encounter (Signed)
Pt has been set up for tele pre op appt 12/25/22 @ 9 am. Med rec and consent are done. Pt states Dr. Craig Staggers has been managing Eliquis. I stated that Dr. Darrell Jewel office will need to reach out to Dr. Renne Crigler for clearance for Eliquis.      Patient Consent for Virtual Visit        Jacob Hensley has provided verbal consent on 12/19/2022 for a virtual visit (video or telephone).   CONSENT FOR VIRTUAL VISIT FOR:  Jacob Hensley  By participating in this virtual visit I agree to the following:  I hereby voluntarily request, consent and authorize [facility] and its employed or contracted physicians, physician assistants, nurse practitioners or other licensed health care professionals (the Practitioner), to provide me with telemedicine health care services (the "Services") as deemed necessary by the treating Practitioner. I acknowledge and consent to receive the Services by the Practitioner via telemedicine. I understand that the telemedicine visit will involve communicating with the Practitioner through live audiovisual communication technology and the disclosure of certain medical information by electronic transmission. I acknowledge that I have been given the opportunity to request an in-person assessment or other available alternative prior to the telemedicine visit and am voluntarily participating in the telemedicine visit.  I understand that I have the right to withhold or withdraw my consent to the use of telemedicine in the course of my care at any time, without affecting my right to future care or treatment, and that the Practitioner or I may terminate the telemedicine visit at any time. I understand that I have the right to inspect all information obtained and/or recorded in the course of the telemedicine visit and may receive copies of available information for a reasonable fee.  I understand that some of the potential risks of receiving the Services via telemedicine include:   Delay or interruption in medical evaluation due to technological equipment failure or disruption; Information transmitted may not be sufficient (e.g. poor resolution of images) to allow for appropriate medical decision making by the Practitioner; and/or  In rare instances, security protocols could fail, causing a breach of personal health information.  Furthermore, I acknowledge that it is my responsibility to provide information about my medical history, conditions and care that is complete and accurate to the best of my ability. I acknowledge that Practitioner's advice, recommendations, and/or decision may be based on factors not within their control, such as incomplete or inaccurate data provided by me or distortions of diagnostic images or specimens that may result from electronic transmissions. I understand that the practice of medicine is not an exact science and that Practitioner makes no warranties or guarantees regarding treatment outcomes. I acknowledge that a copy of this consent can be made available to me via my patient portal [facility] MyChart), or I can request a printed copy by calling the office of [facility].    I understand that my insurance will be billed for this visit.   I have read or had this consent read to me. I understand the contents of this consent, which adequately explains the benefits and risks of the Services being provided via telemedicine.  I have been provided ample opportunity to ask questions regarding this consent and the Services and have had my questions answered to my satisfaction. I give my informed consent for the services to be provided through the use of telemedicine in my medical care

## 2022-12-19 NOTE — Telephone Encounter (Addendum)
   Name: Jacob Hensley  DOB: 10-Jun-1946  MRN: [id number]  Primary Cardiologist: Thurmon Fair, MD   Preoperative team, please contact this patient and set up a phone call appointment for further preoperative risk assessment. Please obtain consent and complete medication review. Thank you for your help.  I confirm that guidance regarding antiplatelet and oral anticoagulation therapy has been completed and, if necessary, noted below.  Pt on Eliquis for recurrent DVT, also with hx of PFO and stroke in 2018. Cardiology is not managing/prescribing pt's anticoag, recommend clearance come from managing provider.    Napoleon Form, Leodis Rains, NP 12/19/2022, 7:53 AM [facility]

## 2022-12-19 NOTE — Telephone Encounter (Signed)
Pt has been set up for tele pre op appt 12/25/22 @ 9 am. Med rec and consent are done. Pt states Dr. Craig Staggers has been managing Eliquis. I stated that Dr. Darrell Jewel office will need to reach out to Dr. Renne Crigler for clearance for Eliquis.

## 2022-12-19 NOTE — Telephone Encounter (Signed)
Pt on Eliquis for recurrent DVT, also with hx of PFO and stroke in 2018. Cardiology is not managing/prescribing pt's anticoag, recommend clearance come from managing provider.

## 2022-12-25 ENCOUNTER — Ambulatory Visit: Payer: Medicare Other | Attending: Cardiology

## 2022-12-25 DIAGNOSIS — Z01810 Encounter for preprocedural cardiovascular examination: Secondary | ICD-10-CM

## 2022-12-25 NOTE — Progress Notes (Signed)
Virtual Visit via Telephone Note   Because of Jacob Hensley's co-morbid illnesses, he is at least at moderate risk for complications without adequate follow up.  This format is felt to be most appropriate for this patient at this time.  The patient did not have access to video technology/had technical difficulties with video requiring transitioning to audio format only (telephone).  All issues noted in this document were discussed and addressed.  No physical exam could be performed with this format.  Please refer to the patient's chart for his consent to telehealth for [facility].  Evaluation Performed:  Preoperative cardiovascular risk assessment _____________   Date:  12/25/2022   Patient ID:  Jacob Hensley, DOB 05-08-1946, MRN [id number] Patient Location:  Home Provider location:   Office  Primary Care Provider:  Merri Brunette, MD Primary Cardiologist:  Thurmon Fair, MD  Chief Complaint / Patient Profile   77 y.o. y/o male with a h/o CAD s/p CABG 2003, DM type II, HLD, HTN, DVT, BPH who is pending ectropion repair and presents today for telephonic preoperative cardiovascular risk assessment.  History of Present Illness    Jacob Hensley is a 77 y.o. male who presents via audio/video conferencing for a telehealth visit today.  Pt was last seen in cardiology clinic on 09/02/2022 by Dr. Royann Shivers.  At that time Jacob Hensley was doing well with no new cardiac complaints.  Patient's blood pressure was excellently controlled.  The patient is now pending procedure as outlined above. Since his last visit, he continues to do well with no new cardiac complaints.  He denies chest pain, shortness of breath, lower extremity edema, fatigue, palpitations, melena, hematuria, hemoptysis, diaphoresis, weakness, presyncope, syncope, orthopnea, and PND.     Past Medical History    Past Medical History:  Diagnosis Date   BPH (benign prostatic hyperplasia)    Coronary  artery disease    CABG 2003-DR. CROITORU IS PT'S CARDIOLOGIST - LAST OFFICE VISIT 08/02/11-PT EXERCISES DAILY-NO C/O OF CHEST PAIN   Diabetes mellitus    BORDERLINE - PT CHECKS HIS BLOOD SUGARS AT HOME - NO MEDS   DVT of leg (deep venous thrombosis) (HCC) 2003   S/P CABG SURGERY   Hyperlipidemia    Hypertension    Melanoma (HCC)    Back   Stroke (HCC)    Urine retention    PT STATES HE HAS BEEN DOING I&O CATHS FOR PAST 3 YRS   Past Surgical History:  Procedure Laterality Date   CATARACT EXTRACTION W/ INTRAOCULAR LENS  IMPLANT, BILATERAL     COLONOSCOPY     Dr. Arty Baumgartner   CORONARY ARTERY BYPASS GRAFT  02/05/2002   LIMA to LAD,RIMA to PDA,SVG to diagonal & free left radial arter to the oblique marginal artery - Dr. Laneta Simmers   CPET w/PFT  1//20/2014   Normal   CYSTOSCOPY WITH BIOPSY  11/08/2011   Procedure: CYSTOSCOPY WITH BIOPSY;  Surgeon: Antony Haste, MD;  Location: [facility];  Service: Urology;;  Bladder biopsies   CYSTOSCOPY WITH BIOPSY  06/19/2012   Procedure: CYSTOSCOPY WITH BIOPSY;  Surgeon: Antony Haste, MD;  Location: [facility];  Service: Urology;  Laterality: N/A;  fulgaration of bleeders   INGUINAL HERNIA REPAIR Right 10/31/2020   Procedure: RIGHT OPEN INGUINAL HERNIA REPAIR WITH MESH;  Surgeon: Kinsinger, De Blanch, MD;  Location: [facility];  Service: General;  Laterality: Right;  60/RM5   MELANOMA EXCISION  2012   TONSILLECTOMY  TRANSURETHRAL RESECTION OF PROSTATE  11-08-2011   US ECHOCARDIOGRAPHY  04/14/2009   EF >50%,borderline dilated RV w/normal systolic fx,mildly dilated LA,mild AI,MR,TR.    Allergies  No Known Allergies  Home Medications    Prior to Admission medications   Medication Sig Start Date End Date Taking? Authorizing Provider  ACCU-CHEK GUIDE test strip every other day. 03/25/22   [provider]  amLODipine (NORVASC) 5 MG tablet Take 5 mg by mouth daily. 03/04/22   [provider]  apixaban (ELIQUIS) 5 MG TABS tablet Take  1 tablet (5 mg total) by mouth 2 (two) times daily. 05/13/19   Ihor Austin, NP  Ascorbic Acid (VITAMIN C) 1000 MG tablet Take 1,000 mg by mouth daily.    [provider]  benzonatate (TESSALON) 100 MG capsule Take 100 mg by mouth 3 (three) times daily. 08/29/22   [provider]  Cholecalciferol (VITAMIN D3) 125 MCG (5000 UT) TABS Take 1 tablet by mouth daily in the afternoon.    [provider]  Empagliflozin (JARDIANCE PO) Take 12.5 mg by mouth in the morning.    [provider]  ezetimibe (ZETIA) 10 MG tablet Take 1 tablet (10 mg total) by mouth daily. 02/01/21 12/19/22  Croitoru, Mihai, MD  guaifenesin (HUMIBID E) 400 MG TABS tablet Take 400 mg by mouth every 4 (four) hours as needed. 08/29/22   [provider]  hydrochlorothiazide (HYDRODIURIL) 25 MG tablet Take 25 mg by mouth daily.    [provider]  metFORMIN (GLUCOPHAGE) 500 MG tablet Take 500 mg by mouth 2 (two) times daily.    [provider]  metoprolol succinate (TOPROL-XL) 25 MG 24 hr tablet Take 1 tablet (25 mg total) by mouth daily before breakfast. 02/18/20   Croitoru, Mihai, MD  metroNIDAZOLE (METROGEL) 0.75 % gel Apply 1 Application topically as needed.    [provider]  Multiple Vitamins-Minerals (MULTIVITAMIN WITH MINERALS) tablet Take 1 tablet by mouth daily.    [provider]  ramipril (ALTACE) 10 MG capsule Take 2 capsules by mouth daily.    [provider]  rosuvastatin (CRESTOR) 40 MG tablet Take 1 tablet (40 mg total) by mouth at bedtime. 02/12/17   Marvel Plan, MD    Physical Exam    Vital Signs:  Jacob Hensley does not have vital signs available for review today.  Given telephonic nature of communication, physical exam is limited. AAOx3. NAD. Normal affect.  Speech and respirations are unlabored.  Accessory Clinical Findings    None  Assessment & Plan    1.  Preoperative Cardiovascular Risk Assessment:  RCRI is  0.9%  The patient affirms he has been doing well without any new cardiac symptoms. They are able to achieve 5 METS without cardiac limitations. Therefore, based on ACC/AHA guidelines, the patient would be at acceptable risk for the planned procedure without further cardiovascular testing. The patient was advised that if he develops new symptoms prior to surgery to contact our office to arrange for a follow-up visit, and he verbalized understanding.   The patient was advised that if he develops new symptoms prior to surgery to contact our office to arrange for a follow-up visit, and he verbalized understanding.  Pt on Eliquis for recurrent DVT, also with hx of PFO and stroke in 2018. Cardiology is not managing/prescribing pt's anticoag, recommend clearance come from managing provider.   A copy of this note will be routed to requesting surgeon.  Time:   Today, I have spent  6 minutes with the patient with telehealth technology discussing medical history, symptoms, and management plan.     Napoleon Form, Leodis Rains, NP  12/25/2022, 6:49 AM

## 2022-12-27 DIAGNOSIS — H02132 Senile ectropion of right lower eyelid: Secondary | ICD-10-CM | POA: Diagnosis not present

## 2022-12-27 DIAGNOSIS — H02135 Senile ectropion of left lower eyelid: Secondary | ICD-10-CM | POA: Diagnosis not present

## 2023-01-10 ENCOUNTER — Other Ambulatory Visit: Payer: Self-pay

## 2023-01-10 DIAGNOSIS — H02834 Dermatochalasis of left upper eyelid: Secondary | ICD-10-CM | POA: Diagnosis not present

## 2023-01-10 DIAGNOSIS — H02831 Dermatochalasis of right upper eyelid: Secondary | ICD-10-CM | POA: Diagnosis not present

## 2023-01-15 ENCOUNTER — Telehealth: Payer: Self-pay | Admitting: Cardiovascular Disease

## 2023-01-15 NOTE — Telephone Encounter (Signed)
Patient is calling to see if the Dr received the letter that he drop off. He states that its a letter from the [place] for his appeals, that he need the Dr to write and signed. Please advise

## 2023-01-17 ENCOUNTER — Encounter: Payer: Self-pay | Admitting: Cardiovascular Disease

## 2023-01-20 NOTE — Telephone Encounter (Signed)
Copied this message into phone note encounter

## 2023-01-20 NOTE — Telephone Encounter (Signed)
Jennette Dubin Blinder "[name]"  P Cv Div Nl Triage (supporting [employer], MD)3 days ago    Forgot to state that this is about exposure while serving in the [place] [employer] onboard the [employer] during the [place] era and being around jet fuels and other toxic substances. You can do a search on the PACT ACT and you will see why the NEXUS LETTER. Thanks Again and take care [name]

## 2023-01-23 ENCOUNTER — Encounter: Payer: Self-pay | Admitting: Cardiovascular Disease

## 2023-01-23 NOTE — Telephone Encounter (Signed)
Gave the patient Dr [employer] message. He will need a signed copy. I will call him after it is signed, then he will pick it up.  He said to tell Dr Royann Shivers, "Thank you." I told him that I would tell him.

## 2023-01-23 NOTE — Telephone Encounter (Signed)
Please tell Jacob Hensley that I have composed the letter. He can see it and print it from MyChart. He probably wants a signed copy. I can go to the office tomorrow afternoon and sign a copy there. I cannot leave the hospital today.

## 2024-05-19 DIAGNOSIS — R3912 Poor urinary stream: Secondary | ICD-10-CM | POA: Diagnosis not present

## 2024-05-19 DIAGNOSIS — Z8551 Personal history of malignant neoplasm of bladder: Secondary | ICD-10-CM | POA: Diagnosis not present

## 2024-05-19 DIAGNOSIS — N401 Enlarged prostate with lower urinary tract symptoms: Secondary | ICD-10-CM | POA: Diagnosis not present
